# Patient Record
Sex: Female | Born: 1935
Health system: Southern US, Community
[De-identification: ages and names within clinical notes are randomized; demographics above are authoritative.]

## PROBLEM LIST (undated history)

## (undated) DIAGNOSIS — I10 Essential (primary) hypertension: Secondary | ICD-10-CM

## (undated) DIAGNOSIS — K5900 Constipation, unspecified: Secondary | ICD-10-CM

## (undated) DIAGNOSIS — N3281 Overactive bladder: Secondary | ICD-10-CM

## (undated) DIAGNOSIS — D649 Anemia, unspecified: Secondary | ICD-10-CM

## (undated) DIAGNOSIS — S7291XA Unspecified fracture of right femur, initial encounter for closed fracture: Secondary | ICD-10-CM

## (undated) HISTORY — DX: Constipation, unspecified: K59.00

## (undated) HISTORY — PX: ROTATOR CUFF REPAIR: SHX139

## (undated) HISTORY — DX: Unspecified fracture of right femur, initial encounter for closed fracture: S72.91XA

## (undated) HISTORY — DX: Overactive bladder: N32.81

## (undated) HISTORY — PX: KNEE ARTHROPLASTY: SHX992

## (undated) HISTORY — DX: Anemia, unspecified: D64.9

## (undated) HISTORY — PX: ABDOMINAL HYSTERECTOMY: SHX81

---

## 1998-01-03 ENCOUNTER — Ambulatory Visit (HOSPITAL_COMMUNITY): Admission: RE | Admit: 1998-01-03 | Discharge: 1998-01-03 | Payer: Self-pay | Admitting: *Deleted

## 1998-07-17 ENCOUNTER — Encounter: Payer: Self-pay | Admitting: Obstetrics and Gynecology

## 1998-07-17 ENCOUNTER — Ambulatory Visit (HOSPITAL_COMMUNITY): Admission: RE | Admit: 1998-07-17 | Discharge: 1998-07-17 | Payer: Self-pay | Admitting: Obstetrics and Gynecology

## 1999-09-04 ENCOUNTER — Encounter: Payer: Self-pay | Admitting: Obstetrics and Gynecology

## 1999-09-04 ENCOUNTER — Ambulatory Visit (HOSPITAL_COMMUNITY): Admission: RE | Admit: 1999-09-04 | Discharge: 1999-09-04 | Payer: Self-pay | Admitting: Obstetrics and Gynecology

## 2002-10-22 ENCOUNTER — Ambulatory Visit (HOSPITAL_COMMUNITY): Admission: RE | Admit: 2002-10-22 | Discharge: 2002-10-22 | Payer: Self-pay | Admitting: Gastroenterology

## 2007-02-14 ENCOUNTER — Encounter: Admission: RE | Admit: 2007-02-14 | Discharge: 2007-02-28 | Payer: Self-pay | Admitting: Orthopedic Surgery

## 2007-03-06 ENCOUNTER — Inpatient Hospital Stay (HOSPITAL_COMMUNITY): Admission: RE | Admit: 2007-03-06 | Discharge: 2007-03-09 | Payer: Self-pay | Admitting: Orthopedic Surgery

## 2007-03-30 ENCOUNTER — Encounter: Admission: RE | Admit: 2007-03-30 | Discharge: 2007-05-26 | Payer: Self-pay | Admitting: Orthopedic Surgery

## 2011-01-12 NOTE — H&P (Signed)
Amanda Berry, Amanda Berry                ACCOUNT NO.:  [id number]   MEDICAL RECORD NO.:  [id number]          PATIENT TYPE:  INP   LOCATION:  NA                           FACILITY:  [facility]   PHYSICIAN:  Madlyn Frankel. Charlann Boxer, M.D.  DATE OF BIRTH:  1933-11-18   DATE OF ADMISSION:  03/06/2007  DATE OF DISCHARGE:                              HISTORY & PHYSICAL   ATTENDING PHYSICIAN:  Durene Romans, M.D.   PROCEDURE TO BE PERFORMED:  Right total hip arthroplasty.   CHIEF COMPLAINT:  Right knee pain.   HISTORY OF PRESENT ILLNESS:  The patient is a 77 year old female with  persistent progressive right knee pain refractory to conservative  treatment.  She had been presurgically assessed by Dr. Tiburcio Pea for a  right total knee arthroplasty.   PAST MEDICAL HISTORY:  The past medical history includes hypertension,  osteoarthritis, and dyslipidemia.   PAST SURGICAL HISTORY:  Hysterectomy in 1988 and rotator cuff repair in  1996.   FAMILY HISTORY:  Heart disease, cancer, arthritis, and colon cancer.   SOCIAL HISTORY:  She is married, retired, her primary caregiver will be  her husband and daughter after surgery.   DRUG ALLERGIES:  NO KNOWN DRUG ALLERGIES.   MEDICATIONS:  Zocor with unknown dosage amount, one daily, and Tylenol  arthritis p.r.n.Marland Kitchen   REVIEW OF SYSTEMS:  GYNECOLOGIC:  Postmenopausal with hot flashes.  Otherwise see HPI.   PHYSICAL EXAMINATION:  VITAL SIGNS:  Pulse 72, respirations 18, and  blood pressure 136/96.  GENERAL:  Awake, alert and oriented, well-developed, well-nourished, in  no acute distress.  NECK:  Supple no carotid bruits.  CHEST/LUNGS:  Clear to auscultation bilaterally.  BREASTS:  Deferred.  HEART:  Regular rate and rhythm without gallops, clicks, rubs, or  murmurs.  ABDOMEN:  Soft, nontender, nondistended.  Bowel sounds are present in  all four quadrants.  GENITOURINARY:  Deferred.  EXTREMITIES:  Valgus deformity, near full extension.  SKIN:  No cellulitis.   Dorsalis pedis pulse positive, right lower  extremity.  NEUROLOGIC:  Intact distal sensibilities.   LABORATORY STUDIES:  EKG and chest x-ray are all pending.   IMPRESSION:  1. Osteoarthritis.  2. Dyslipidemia.  3. Pre hypertensive.   PLAN OF ACTION:  Right total hip arthroplasty on 03/06/07 at [facility] by surgeon Dr. Durene Romans.  The risks and complications were  discussed, questions were encouraged, answered, and reviewed.   A prescription for postoperative medicines were given at the time of  history and physical.     ______________________________  Yetta Glassman. [address], [place]      Madlyn Frankel. Charlann Boxer, M.D.  Electronically Signed    BLM/MEDQ  D:  03/02/2007  T:  03/03/2007  Job:  [id number]   cc:   Madlyn Frankel Charlann Boxer, M.D.  Fax: [phone]   Yetta Glassman. [address], [place]

## 2011-01-12 NOTE — Op Note (Signed)
Amanda Berry, Amanda Berry                ACCOUNT NO.:  [id number]   MEDICAL RECORD NO.:  [id number]          PATIENT TYPE:  INP   LOCATION:  0002                         FACILITY:  [facility]   PHYSICIAN:  Madlyn Frankel. Charlann Boxer, M.D.  DATE OF BIRTH:  November 23, 1933   DATE OF PROCEDURE:  03/06/2007  DATE OF DISCHARGE:                               OPERATIVE REPORT   PREOPERATIVE DIAGNOSIS:  Right knee end-stage osteoarthritis.   POSTOPERATIVE DIAGNOSIS:  Right knee end-stage osteoarthritis.   PROCEDURE:  Right total knee replacement.   COMPONENTS:  DePuy rotating platform, posterior stabilized knee system  with size 2.5 femur, 2 tibia, 15 mm insert, and a 35 patella button.   SURGEON:  Dr. Charlann Boxer   ASSISTANT:  Dwyane Luo, PA-C   ANESTHESIA:  General/spinal.   COMPLICATIONS:  None.   DRAINS:  None.   TOURNIQUET TIME:  60 minutes at 250 mmHg.   INDICATION FOR PROCEDURE:  Amanda Berry is a very pleasant 77 year old  female who presented to the office with end-stage lateral compartment  degenerative changes with severe valgus deformity of the right knee.  She had a quite significant dynamic deformity of at least 20-25 degrees.  This was not totally passively correctable.  She had 5-degree flexion  contracture giving her a combined deformity of about 25 degrees.   Discussed with her treatment options, as she failed conservative  measures.  Surgical issues were discussed including risk of infection,  DVT, component failure, need for revision and surgery as well as  potential issues in the neurovascular areas given the valgus deformity  that she had.  Consent obtained.   PROCEDURE IN DETAIL:  The patient was brought to the operative theatre.  Once adequate anesthesia and preoperative antibiotics, 1 g of Ancef,  were administered, the patient was positioned supine.  Proximal thigh  tourniquet was placed.  The right lower extremity was then prescrubbed  then prepped and draped in a sterile fashion.  A  midline incision was  made followed by a lateral parapatellar arthrotomy due to her valgus  deformity.  This was done for release purposes and, as well, to assist  with patella tracking.   Following initial exposure and debridement of significant osteophytes  off the distal femur both medial and lateral on the patella, I was able  to subluxate the patella laterally without any complications.   Following initial exposure including fat pad debridement, I was able to  enter the femoral canal with a drill, irrigated and fat emboli.  I then  placed the intramedullary rod at 5 degrees of valgus, and I resected 10  mm of bone.  There was no significant femoral hyperplasia that resulted  in needing distal augments.   Following this, I sized the femur to be a size 2.5.  The anterior,  posterior, and chamfer cuts were all made based off the posterior  condylar axis which actually was, despite her valgus deformity,  perpendicularly to Whiteside's line.   At this point, attention was now directed to the tibia.  Note that  osteophytes were debrided off the medial and lateral aspects  of the  femur.  With the tibia exposed, I used the extramedullary guide.  Initially, I resected 2 mm of bone based off the lateral proximal tibia  that was defective side.  Following this cut, I checked the spacer block  and found that the knee was still tight.  There was still a significant  amount of tightness in the lateral aspect of the leg; however, even with  it out straight, I was unable to get the 10 mm extension block in.  I  replaced the cutting block and resected 2 more mm of bone off the  proximal tibia.   At this point, too, I also carried out more of the lateral release,  elevating the lateral structures off the proximal tibia from my  exposure.  Following this exposure, I then was able to place the 10 mm  block, and the knee actually was fairly balanced.  There was still a  little tightness laterally,  but I felt this was acceptable.  The knee  came out to at least extension given the release and this cut at this  point.  Further debridement was carried out on the posterior aspect of  the femur and around the tibia as needed.  I sized the tibia to be a  size 2.  With the tibial tray in place, I passed an align rod and was  happy that the cut was perpendicular in both the coronal and sagittal  planes.  I went ahead and pinned it, drilled, and keel punched the tibia  and then did a trial reduction, 2.5 femur, 2 tibia, and initially a 10  mm insert.  I then trialed with a 12.  Even with a 12, the knee came out  easily; it was straight, and the knee ligaments very stable.  There was  no laxity medially or laterally, and I gave some thought to retrial with  the final components in place.  At this point, with the trial components  in place, I did my patella cut.  Precut measurement was 22 mm.  That was  taken down to 14 mm and used a 35 patella button.  I did place the 2  holes medial and the 1 hole laterally.   Following this, the patella was noted to track through the trochlea  without any chest tilt.  At this point, all trial components were  removed.  The knee was copiously irrigated with normal saline solution.  It was injected with 60 mL of 0.25% Marcaine with epinephrine and 1 mL  of Toradol.  When the knee component was dried and exposure obtained,  the final components were cemented into position.  The knee was brought  out with a 12.5 spacer initially, and extruded cement was removed.  Once  the cement had cured, the knee was exposed, debriding further cement  that was visible.  I then trialed with a 15 poly and was happy the knee  came out in extension and was balanced.  I chose the 15 poly as my final  insert.   The tourniquet was let down at 60 minutes.  FloSeal was placed in the  posteromedial and lateral aspect of the knee when the final 15 x 2.5  poly was inserted.  At this  point, the extensor mechanism was  reapproximated using #1 Vicryl.  The remainder of the wound was closed  in layers with running 4-0 Monocryl.  The knee was then dressed into a  sterile bulky dressing.  She was brought to the recovery room in stable  condition.      Madlyn Frankel Charlann Boxer, M.D.  Electronically Signed     MDO/MEDQ  D:  03/06/2007  T:  03/06/2007  Job:  [id number]

## 2011-01-12 NOTE — Discharge Summary (Signed)
NAMEKALISHA, Amanda                ACCOUNT NO.:  [id number]   MEDICAL RECORD NO.:  [id number]          PATIENT TYPE:  INP   LOCATION:  1620                         [facility]ACILITY:  [facility]   PHYSICIAN:  Madlyn Frankel. Charlann Boxer, M.D.  DATE OF BIRTH:  08-11-1934   DATE OF ADMISSION:  03/06/2007  DATE OF DISCHARGE:  03/09/2007                               DISCHARGE SUMMARY   ADMITTING DIAGNOSES:  1. Osteoarthritis.  2. Hypertension.   DISCHARGE DIAGNOSES:  1. Osteoarthritis.  2. Hypertension.  3. Acute blood loss anemia.   CONSULTATION:  None.   PROCEDURE:  Right total knee arthroplasty by surgeon Dr. Durene Romans.  Assistant Dwyane Luo PA.   HISTORY OF PRESENT ILLNESS:  Amanda Berry is a 77 year old female with  persistent progressive right knee pain refractory to all conservative  treatment secondary to osteoarthritis.  She was presurgically cleared by  Dr. Tiburcio Pea for right total knee arthroplasty.   LABORATORY DATA:  Labs preadmission CBC hematocrit 38.1.  Postop day #1  hematocrit 27.7.  Postop day #2 hematocrit 22.8, replenished 2 units  packed red blood cells.  At discharge, hematocrit 28.4.  Coagulation  preadmission within normal limits.  Chemistries preadmission all within  normal limits.  Postop day #1, sodium 133, glucose 125.  On postop day  #2, sodium normalized 140, glucose 111 and stable.  Kidney well-perfused  throughout.  GFR greater than 60, calcium 8.1 at discharge.  GI workup  showed albumin 3.4.  UA showed small leukocyte esterase and few  epithelials.   RADIOLOGY:  Chest two-view no acute time.   Cardiology normal sinus rhythm and EKG.   HOSPITAL COURSE:  The patient underwent right total knee arthroplasty.  Tolerated procedure well. Was admitted to the orthopedic floor.  She  remained neurovascularly intact throughout her course of stay.  Pain was  well-controlled.  Dressing was changed after postop day number one on  daily basis.  Wound had no active drainage.  Had a  little bit of nausea  and vomiting and was administered Zofran IV q. 6.  PT was begun as well  as DVT prophylaxis.  Postop day #2, doing well, did have acute blood  loss anemia transfused 2 units tolerated the transfusion well.  Progressed nicely with physical therapy.  On postop day #3, was  afebrile, hematocrit back to 28.4.  Dressing was checked and was ready  for discharge home health care PT after Lovenox teaching.   DISCHARGE DISPOSITION:  Discharged home, stable and improved condition  with home health care PT.   DISCHARGE INSTRUCTIONS:  1. Diet:  Regular.  2. Discharge wound care keep dressing dry change daily.  3. Activity:  Weightbearing as tolerated with the use of a rolling      walker.   DISCHARGE FOLLOWUP:  Follow with Dr. Charlann Boxer in [phone] in 2 weeks.   DISCHARGE MEDICATIONS:  1. Vicodin 5/325 1-2 p.o. q. 4-6 pain.  2. Lovenox 30 mg subcu q. 12 times 11 days.  3. Robaxin 500 mg p.o. q. 6.  4. Colace 100 mg p.o. b.i.d.  5. MiraLax 17 grams p.o.  daily.  6. Enteric-coated aspirin 325 mg p.o. daily x4 weeks after Lovenox.  7. Iron 325 mg p.o. t.i.d. x2 weeks.  8. Zocor 20 mg one q.p.m.  9. Fish oil.  10.Black cohosh   SPECIAL INSTRUCTIONS:  If the patient develops acute shortness of breath  or severe calf pain, follow up with or call emergency services  immediately.     ______________________________  Yetta Glassman [address], [place]      Madlyn Frankel. Charlann Boxer, M.D.  Electronically Signed    BLM/MEDQ  D:  03/21/2007  T:  03/22/2007  Job:  [id number]

## 2011-06-15 LAB — URINALYSIS, ROUTINE W REFLEX MICROSCOPIC
Bilirubin Urine: NEGATIVE
Hgb urine dipstick: NEGATIVE
Specific Gravity, Urine: 1.016
Urobilinogen, UA: 0.2

## 2011-06-15 LAB — CBC
HCT: 38.1
Hemoglobin: 13.1
Hemoglobin: 8.1 — ABNORMAL LOW
MCHC: 34.4
MCHC: 35.3
MCV: 88.9
MCV: 89.4
MCV: 89.8
Platelets: 235
RBC: 2.56 — ABNORMAL LOW
RBC: 3.1 — ABNORMAL LOW
RBC: 4.24
RDW: 14.1 — ABNORMAL HIGH
WBC: 5.3
WBC: 6.9

## 2011-06-15 LAB — BASIC METABOLIC PANEL
CO2: 28
Calcium: 8.3 — ABNORMAL LOW
Chloride: 101
Chloride: 108
Creatinine, Ser: 0.51
GFR calc Af Amer: >60
GFR calc Af Amer: >60
GFR calc non Af Amer: >60
Potassium: 4.2
Sodium: 140

## 2011-06-15 LAB — TYPE AND SCREEN
ABO/RH(D): O NEG
Antibody Screen: NEGATIVE

## 2011-06-15 LAB — COMPREHENSIVE METABOLIC PANEL
ALT: 18
AST: 22
Albumin: 3.4 — ABNORMAL LOW
Alkaline Phosphatase: 71
BUN: 7
CO2: 28
Calcium: 9.3
Chloride: 107
Creatinine, Ser: 0.65
GFR calc Af Amer: >60
GFR calc non Af Amer: >60
Glucose, Bld: 93
Potassium: 4.1
Sodium: 142
Total Bilirubin: 0.9
Total Protein: 6.8

## 2011-06-15 LAB — URINE MICROSCOPIC-ADD ON

## 2011-06-15 LAB — ABO/RH: ABO/RH(D): O NEG

## 2011-06-15 LAB — PROTIME-INR
INR: 1
Prothrombin Time: 13

## 2015-08-31 DIAGNOSIS — S7291XA Unspecified fracture of right femur, initial encounter for closed fracture: Secondary | ICD-10-CM

## 2015-08-31 HISTORY — DX: Unspecified fracture of right femur, initial encounter for closed fracture: S72.91XA

## 2015-09-22 DIAGNOSIS — M159 Polyosteoarthritis, unspecified: Secondary | ICD-10-CM | POA: Diagnosis not present

## 2015-09-22 DIAGNOSIS — I10 Essential (primary) hypertension: Secondary | ICD-10-CM | POA: Diagnosis not present

## 2015-09-22 DIAGNOSIS — K219 Gastro-esophageal reflux disease without esophagitis: Secondary | ICD-10-CM | POA: Diagnosis not present

## 2015-09-22 DIAGNOSIS — Z23 Encounter for immunization: Secondary | ICD-10-CM | POA: Diagnosis not present

## 2015-09-22 DIAGNOSIS — E7800 Pure hypercholesterolemia, unspecified: Secondary | ICD-10-CM | POA: Diagnosis not present

## 2015-09-22 DIAGNOSIS — N3281 Overactive bladder: Secondary | ICD-10-CM | POA: Diagnosis not present

## 2016-03-18 DIAGNOSIS — N3281 Overactive bladder: Secondary | ICD-10-CM | POA: Diagnosis not present

## 2016-03-18 DIAGNOSIS — M159 Polyosteoarthritis, unspecified: Secondary | ICD-10-CM | POA: Diagnosis not present

## 2016-03-18 DIAGNOSIS — E7800 Pure hypercholesterolemia, unspecified: Secondary | ICD-10-CM | POA: Diagnosis not present

## 2016-03-18 DIAGNOSIS — K219 Gastro-esophageal reflux disease without esophagitis: Secondary | ICD-10-CM | POA: Diagnosis not present

## 2016-03-18 DIAGNOSIS — I10 Essential (primary) hypertension: Secondary | ICD-10-CM | POA: Diagnosis not present

## 2016-03-30 DIAGNOSIS — D649 Anemia, unspecified: Secondary | ICD-10-CM

## 2016-03-30 HISTORY — DX: Anemia, unspecified: D64.9

## 2016-04-11 ENCOUNTER — Inpatient Hospital Stay (HOSPITAL_COMMUNITY)
Admission: EM | Admit: 2016-04-11 | Discharge: 2016-04-15 | DRG: 482 | Disposition: A | Discharge status: Skilled Nursing Facility | Payer: PPO | Attending: Orthopedic Surgery | Admitting: Orthopedic Surgery

## 2016-04-11 ENCOUNTER — Inpatient Hospital Stay (HOSPITAL_COMMUNITY): Payer: PPO

## 2016-04-11 ENCOUNTER — Encounter (HOSPITAL_COMMUNITY): Payer: Self-pay | Admitting: Emergency Medicine

## 2016-04-11 ENCOUNTER — Emergency Department (HOSPITAL_COMMUNITY): Payer: PPO

## 2016-04-11 DIAGNOSIS — S72401A Unspecified fracture of lower end of right femur, initial encounter for closed fracture: Secondary | ICD-10-CM | POA: Diagnosis not present

## 2016-04-11 DIAGNOSIS — S72491A Other fracture of lower end of right femur, initial encounter for closed fracture: Secondary | ICD-10-CM

## 2016-04-11 DIAGNOSIS — Z6831 Body mass index (BMI) 31.0-31.9, adult: Secondary | ICD-10-CM | POA: Diagnosis not present

## 2016-04-11 DIAGNOSIS — I10 Essential (primary) hypertension: Secondary | ICD-10-CM | POA: Diagnosis present

## 2016-04-11 DIAGNOSIS — Z01818 Encounter for other preprocedural examination: Secondary | ICD-10-CM | POA: Diagnosis not present

## 2016-04-11 DIAGNOSIS — W010XXA Fall on same level from slipping, tripping and stumbling without subsequent striking against object, initial encounter: Secondary | ICD-10-CM | POA: Diagnosis not present

## 2016-04-11 DIAGNOSIS — S7291XA Unspecified fracture of right femur, initial encounter for closed fracture: Secondary | ICD-10-CM | POA: Diagnosis present

## 2016-04-11 DIAGNOSIS — Z96651 Presence of right artificial knee joint: Secondary | ICD-10-CM | POA: Diagnosis present

## 2016-04-11 DIAGNOSIS — S899 Unspecified injury of lower leg: Secondary | ICD-10-CM | POA: Diagnosis not present

## 2016-04-11 DIAGNOSIS — M9711XA Periprosthetic fracture around internal prosthetic right knee joint, initial encounter: Principal | ICD-10-CM | POA: Diagnosis present

## 2016-04-11 DIAGNOSIS — E669 Obesity, unspecified: Secondary | ICD-10-CM | POA: Diagnosis present

## 2016-04-11 DIAGNOSIS — Z419 Encounter for procedure for purposes other than remedying health state, unspecified: Secondary | ICD-10-CM

## 2016-04-11 DIAGNOSIS — S72411A Displaced unspecified condyle fracture of lower end of right femur, initial encounter for closed fracture: Secondary | ICD-10-CM | POA: Diagnosis not present

## 2016-04-11 DIAGNOSIS — M25561 Pain in right knee: Secondary | ICD-10-CM | POA: Diagnosis not present

## 2016-04-11 DIAGNOSIS — G891 Acute pain, not elsewhere classified: Secondary | ICD-10-CM | POA: Diagnosis not present

## 2016-04-11 HISTORY — DX: Essential (primary) hypertension: I10

## 2016-04-11 LAB — BASIC METABOLIC PANEL
Anion gap: 9 (ref 5–15)
BUN: 18 mg/dL (ref 6–20)
CALCIUM: 9.1 mg/dL (ref 8.9–10.3)
CO2: 20 mmol/L — AB (ref 22–32)
Chloride: 109 mmol/L (ref 101–111)
Creatinine, Ser: 0.69 mg/dL (ref 0.44–1.00)
GFR calc Af Amer: >60 mL/min (ref >60)
GFR calc non Af Amer: >60 mL/min (ref >60)
GLUCOSE: 119 mg/dL — AB (ref 65–99)
POTASSIUM: 4 mmol/L (ref 3.5–5.1)
Sodium: 138 mmol/L (ref 135–145)

## 2016-04-11 LAB — CBC WITH DIFFERENTIAL/PLATELET
Basophils Absolute: 0 10*3/uL (ref 0.0–0.1)
Basophils Relative: 0 %
EOS PCT: 1 %
Eosinophils Absolute: 0.1 10*3/uL (ref 0.0–0.7)
HEMATOCRIT: 35.6 % — AB (ref 36.0–46.0)
Hemoglobin: 11.8 g/dL — ABNORMAL LOW (ref 12.0–15.0)
LYMPHS ABS: 1.5 10*3/uL (ref 0.7–4.0)
LYMPHS PCT: 15 %
MCH: 30.3 pg (ref 26.0–34.0)
MCHC: 33.1 g/dL (ref 30.0–36.0)
MCV: 91.5 fL (ref 78.0–100.0)
MONO ABS: 0.5 10*3/uL (ref 0.1–1.0)
Monocytes Relative: 5 %
NEUTROS ABS: 8.2 10*3/uL — AB (ref 1.7–7.7)
Neutrophils Relative %: 79 %
PLATELETS: 216 10*3/uL (ref 150–400)
RBC: 3.89 MIL/uL (ref 3.87–5.11)
RDW: 14.3 % (ref 11.5–15.5)
WBC: 10.3 10*3/uL (ref 4.0–10.5)

## 2016-04-11 LAB — TYPE AND SCREEN
ABO/RH(D): O NEG
Antibody Screen: NEGATIVE

## 2016-04-11 LAB — SURGICAL PCR SCREEN
MRSA, PCR: NEGATIVE
Staphylococcus aureus: NEGATIVE

## 2016-04-11 LAB — PROTIME-INR
INR: 1.05
PROTHROMBIN TIME: 13.8 s (ref 11.4–15.2)

## 2016-04-11 LAB — APTT: APTT: 26 s (ref 24–36)

## 2016-04-11 MED ORDER — HYDROMORPHONE HCL 1 MG/ML IJ SOLN
0.5000 mg | INTRAMUSCULAR | Status: DC | PRN
  Administered 2016-04-11: 0.5 mg via INTRAVENOUS
  Filled 2016-04-11: qty 1

## 2016-04-11 MED ORDER — FENTANYL CITRATE (PF) 100 MCG/2ML IJ SOLN
100.0000 ug | Freq: Once | INTRAMUSCULAR | Status: AC
  Administered 2016-04-11: 100 ug via INTRAVENOUS
  Filled 2016-04-11: qty 2

## 2016-04-11 MED ORDER — ONDANSETRON HCL 4 MG/2ML IJ SOLN
4.0000 mg | Freq: Once | INTRAMUSCULAR | Status: AC
  Administered 2016-04-11: 4 mg via INTRAVENOUS
  Filled 2016-04-11: qty 2

## 2016-04-11 MED ORDER — CEFAZOLIN SODIUM-DEXTROSE 2-4 GM/100ML-% IV SOLN
2.0000 g | INTRAVENOUS | Status: AC
Start: 2016-04-12 — End: 2016-04-12
  Administered 2016-04-12: 2 g via INTRAVENOUS

## 2016-04-11 MED ORDER — HYDROCODONE-ACETAMINOPHEN 5-325 MG PO TABS
1.0000 | ORAL_TABLET | Freq: Four times a day (QID) | ORAL | Status: DC | PRN
  Administered 2016-04-11 – 2016-04-12 (×3): 2 via ORAL
  Filled 2016-04-11 (×3): qty 2

## 2016-04-11 MED ORDER — FAMOTIDINE 20 MG PO TABS
20.0000 mg | ORAL_TABLET | Freq: Every day | ORAL | Status: DC
  Administered 2016-04-12: 20 mg via ORAL
  Filled 2016-04-11: qty 1

## 2016-04-11 MED ORDER — OXYBUTYNIN CHLORIDE 5 MG PO TABS
5.0000 mg | ORAL_TABLET | Freq: Every day | ORAL | Status: DC
  Filled 2016-04-11: qty 1

## 2016-04-11 MED ORDER — SODIUM CHLORIDE 0.9 % IV SOLN
INTRAVENOUS | Status: DC
  Administered 2016-04-11 – 2016-04-12 (×2): via INTRAVENOUS

## 2016-04-11 MED ORDER — MORPHINE SULFATE (PF) 2 MG/ML IV SOLN: 0.5000 mg | INTRAVENOUS | Status: DC | PRN

## 2016-04-11 MED ORDER — CHLORHEXIDINE GLUCONATE 4 % EX LIQD: 60.0000 mL | Freq: Once | CUTANEOUS | Status: DC

## 2016-04-11 MED ORDER — DOCUSATE SODIUM 100 MG PO CAPS
100.0000 mg | ORAL_CAPSULE | Freq: Two times a day (BID) | ORAL | Status: DC
  Administered 2016-04-11: 100 mg via ORAL
  Filled 2016-04-11: qty 1

## 2016-04-11 MED ORDER — POLYETHYLENE GLYCOL 3350 17 G PO PACK: 17.0000 g | PACK | Freq: Every day | ORAL | Status: DC | PRN

## 2016-04-11 MED ORDER — LISINOPRIL 20 MG PO TABS
20.0000 mg | ORAL_TABLET | Freq: Every day | ORAL | Status: DC
  Administered 2016-04-11: 20 mg via ORAL
  Filled 2016-04-11: qty 1

## 2016-04-11 NOTE — ED Notes (Signed)
Bed: WA20 Expected date:  Expected time:  Means of arrival:  Comments: 82 yo fall, Knee pain

## 2016-04-11 NOTE — ED Notes (Signed)
Ortho at bedside.

## 2016-04-11 NOTE — ED Provider Notes (Signed)
[place] DEPT Provider Note   CSN: [id number] Arrival date & time: 04/11/16  1127  First Provider Contact:  First MD Initiated Contact with Patient 04/11/16 1210        History   Chief Complaint Chief Complaint  Patient presents with  . Knee Pain    HPI Amanda Berry is a 82 y.o. female who presents via EMS for knee injury. The patient tripped over her shoe , falling directly onto her right knee. She is a history of previous total knee arthroplasty by Dr. Alvan Dame. She states she had immediate severe pain, swelling and bruising. She is unable even to sit up. EMS had to retrieve her from middle of her church service. She complains of severe pain in the knee and occasional sharp pain radiating up the leg. She denies any numbness or tingling in the foot. She denies hitting her head or lose consciousness. She has a past medical of hypertension. She takes red Rice. He states for cholesterol control, she denies being on any blood thinners and has no cardiac history.  HPI  Past Medical History:  Diagnosis Date  . Hypertension     Patient Active Problem List   Diagnosis Date Noted  . Femur fracture, right, closed, initial encounter 04/11/2016    Past Surgical History:  Procedure Laterality Date  . KNEE ARTHROPLASTY Right    approx 10 years ago    OB History    No data available       Home Medications    Prior to Admission medications   Medication Sig Start Date End Date Taking? Authorizing Provider  ibuprofen (ADVIL,MOTRIN) 200 MG tablet Take 200 mg by mouth every 6 (six) hours as needed for headache, mild pain or moderate pain.   Yes Historical Provider, MD  lisinopril (PRINIVIL,ZESTRIL) 20 MG tablet Take 20 mg by mouth daily. 01/15/16  Yes Historical Provider, MD  oxybutynin (DITROPAN) 5 MG tablet Take 5 mg by mouth at bedtime.  03/23/16  Yes Historical Provider, MD  ranitidine (ZANTAC) 300 MG tablet Take 300 mg by mouth at bedtime.  02/17/16  Yes Historical Provider, MD    Red Yeast Rice Extract (RED YEAST RICE PO) Take 1 tablet by mouth daily.   Yes Historical Provider, MD    Family History No family history on file.  Social History Social History  Substance Use Topics  . Smoking status: Never Smoker  . Smokeless tobacco: Never Used  . Alcohol use Not on file     Allergies   Review of patient's allergies indicates no known allergies.   Review of Systems Review of Systems Ten systems reviewed and are negative for acute change, except as noted in the HPI.    Physical Exam Updated Vital Signs BP (!) 176/65 (BP Location: Right Arm)   Pulse 62   Temp 98.3 F (36.8 C) (Oral)   Resp 18   Ht 5' (1.524 m)   Wt 72.1 kg   SpO2 99%   BMI 31.05 kg/m   Physical Exam  Physical Exam  Nursing note and vitals reviewed. Constitutional: She is oriented to person, place, and time. She appears well-developed and well-nourished. No distress. Appears uncomfortable HENT:  Head: Normocephalic and atraumatic.  Eyes: Conjunctivae normal and EOM are normal. Pupils are equal, round, and reactive to light. No scleral icterus.  Neck: Normal range of motion.  Cardiovascular: Normal rate, regular rhythm and normal heart sounds.  Exam reveals no gallop and no friction rub.   No murmur heard.  Pulmonary/Chest: Effort normal and breath sounds normal. No respiratory distress.  Abdominal: Soft. Bowel sounds are normal. She exhibits no distension and no mass. There is no tenderness. There is no guarding.  Neurological: She is alert and oriented to person, place, and time.  Musculoskeletal: Significant bruising, swelling to the right knee, exquisite tenderness to palpation at the distal femur, unable to perform range of motion testing the knee due to severe pain. Distal pulses intact, full warm and well perfused, sensation intact. No tenderness to palpation of the right hip Skin: Skin is warm and dry. She is not diaphoretic.    ED Treatments / Results  Labs (all labs  ordered are listed, but only abnormal results are displayed) Labs Reviewed  CBC WITH DIFFERENTIAL/PLATELET - Abnormal; Notable for the following:       Result Value   Hemoglobin 11.8 (*)    HCT 35.6 (*)    Neutro Abs 8.2 (*)    All other components within normal limits  BASIC METABOLIC PANEL - Abnormal; Notable for the following:    CO2 20 (*)    Glucose, Bld 119 (*)    All other components within normal limits  APTT  PROTIME-INR  TYPE AND SCREEN    EKG  EKG Interpretation None       Radiology Chest Portable 1 View  Result Date: 04/11/2016 CLINICAL DATA:  Preoperative evaluation EXAM: PORTABLE CHEST 1 VIEW COMPARISON:  09/05/2012 FINDINGS: Cardiac shadow is within normal limits. Aortic calcifications are again seen. Lungs are well-aerated without focal infiltrate or sizable effusion. No acute bony abnormality is noted. IMPRESSION: No active disease. Electronically Signed   By: Inez Catalina M.D.   On: 04/11/2016 16:20   Dg Knee Complete 4 Views Right  Result Date: 04/11/2016 CLINICAL DATA:  Fall today. Right knee pain. Previous right knee arthroplasty. Initial encounter. EXAM: RIGHT KNEE - COMPLETE 4+ VIEW COMPARISON:  None. FINDINGS: Total knee arthroplasty seen. A comminuted fracture of the distal femur is seen just above the femoral component of the prosthesis. There is mild posterior displacement and angulation of the distal fracture fragment. No other fractures identified. No evidence of dislocation. Generalized osteopenia noted. IMPRESSION: Comminuted fracture of distal femur just above femoral component of prosthesis, with mild posterior displacement and angulation. Electronically Signed   By: Earle Gell M.D.   On: 04/11/2016 12:40    Procedures Procedures (including critical care time)  Medications Ordered in ED Medications  HYDROmorphone (DILAUDID) injection 0.5 mg (0.5 mg Intravenous Given 04/11/16 1442)  lisinopril (PRINIVIL,ZESTRIL) tablet 20 mg (not administered)    oxybutynin (DITROPAN) tablet 5 mg (not administered)  famotidine (PEPCID) tablet 20 mg (not administered)  chlorhexidine (HIBICLENS) 4 % liquid 4 application (not administered)  ceFAZolin (ANCEF) IVPB 2g/100 mL premix (not administered)  HYDROcodone-acetaminophen (NORCO/VICODIN) 5-325 MG per tablet 1-2 tablet (not administered)  morphine 2 MG/ML injection 0.5 mg (not administered)  0.9 %  sodium chloride infusion ( Intravenous New Bag/Given 04/11/16 1711)  docusate sodium (COLACE) capsule 100 mg (not administered)  polyethylene glycol (MIRALAX / GLYCOLAX) packet 17 g (not administered)  fentaNYL (SUBLIMAZE) injection 100 mcg (100 mcg Intravenous Given 04/11/16 1333)  ondansetron (ZOFRAN) injection 4 mg (4 mg Intravenous Given 04/11/16 1330)     Initial Impression / Assessment and Plan / ED Course  I have reviewed the triage vital signs and the nursing notes.  Pertinent labs & imaging results that were available during my care of the patient were reviewed by me and considered in  my medical decision making (see chart for details).  Clinical Course  Patient with a distal femur fracture above the prosthesis. I have spoken with Dr. Joaquin Courts is to will consult on the patient here. She has been nothing by mouth since 745 this morning when she had a slice of cheese toast. Discussed the findings with the patient who understands. She appears safe for admission at this time.   Final Clinical Impressions(s) / ED Diagnoses   Final diagnoses:  Other closed fracture of distal end of right femur, initial encounter [facility])    New Prescriptions Current Discharge Medication List       Margarita Mail, PA-C 04/11/16 [place], MD 04/12/16 1701

## 2016-04-11 NOTE — ED Triage Notes (Signed)
Per EMS, pt c/o right knee pain after falling in church. Denies head injury and LOC. Hx right knee replacement.

## 2016-04-11 NOTE — ED Notes (Signed)
Patients family has clothing and rings in patient belonging bag.

## 2016-04-11 NOTE — H&P (Signed)
Amanda Berry is an 82 y.o. female.   Chief Complaint: Right knee pain HPI: 82 yo patient of Dr Adriana Mccallum who is s/p TKR who fell at church and injured her right knee.  She was unable to stand after injury.  Transported by EMS to [facility] ED.  Past Medical History:  Diagnosis Date  . Hypertension     No past surgical history on file.  No family history on file. Social History:  has no tobacco, alcohol, and drug history on file.  Allergies: No Known Allergies   (Not in a hospital admission)  Results for orders placed or performed during the hospital encounter of 04/11/16 (from the past 48 hour(s))  CBC with Differential/Platelet     Status: Abnormal   Collection Time: 04/11/16  1:02 PM  Result Value Ref Range   WBC 10.3 4.0 - 10.5 K/uL   RBC 3.89 3.87 - 5.11 MIL/uL   Hemoglobin 11.8 (L) 12.0 - 15.0 g/dL   HCT 35.6 (L) 36.0 - 46.0 %   MCV 91.5 78.0 - 100.0 fL   MCH 30.3 26.0 - 34.0 pg   MCHC 33.1 30.0 - 36.0 g/dL   RDW 14.3 11.5 - 15.5 %   Platelets 216 150 - 400 K/uL   Neutrophils Relative % 79 %   Neutro Abs 8.2 (H) 1.7 - 7.7 K/uL   Lymphocytes Relative 15 %   Lymphs Abs 1.5 0.7 - 4.0 K/uL   Monocytes Relative 5 %   Monocytes Absolute 0.5 0.1 - 1.0 K/uL   Eosinophils Relative 1 %   Eosinophils Absolute 0.1 0.0 - 0.7 K/uL   Basophils Relative 0 %   Basophils Absolute 0.0 0.0 - 0.1 K/uL  Basic metabolic panel     Status: Abnormal   Collection Time: 04/11/16  1:02 PM  Result Value Ref Range   Sodium 138 135 - 145 mmol/L   Potassium 4.0 3.5 - 5.1 mmol/L   Chloride 109 101 - 111 mmol/L   CO2 20 (L) 22 - 32 mmol/L   Glucose, Bld 119 (H) 65 - 99 mg/dL   BUN 18 6 - 20 mg/dL   Creatinine, Ser 0.69 0.44 - 1.00 mg/dL   Calcium 9.1 8.9 - 10.3 mg/dL   GFR calc non Af Amer >60 >60 mL/min   GFR calc Af Amer >60 >60 mL/min    Comment: (NOTE) The eGFR has been calculated using the CKD EPI equation. This calculation has not been validated in all clinical situations. eGFR's  persistently <60 mL/min signify possible Chronic Kidney Disease.    Anion gap 9 5 - 15   Dg Knee Complete 4 Views Right  Result Date: 04/11/2016 CLINICAL DATA:  Fall today. Right knee pain. Previous right knee arthroplasty. Initial encounter. EXAM: RIGHT KNEE - COMPLETE 4+ VIEW COMPARISON:  None. FINDINGS: Total knee arthroplasty seen. A comminuted fracture of the distal femur is seen just above the femoral component of the prosthesis. There is mild posterior displacement and angulation of the distal fracture fragment. No other fractures identified. No evidence of dislocation. Generalized osteopenia noted. IMPRESSION: Comminuted fracture of distal femur just above femoral component of prosthesis, with mild posterior displacement and angulation. Electronically Signed   By: Earle Gell M.D.   On: 04/11/2016 12:40    ROS  Blood pressure 134/63, pulse 65, temperature 98.4 F (36.9 C), temperature source Oral, resp. rate 18, height 5' (1.524 m), weight 72.1 kg (159 lb), SpO2 96 %. Physical Exam AAO, mod distress.  Neck  nontender and normal AROM Chest with normal excursion, no pain with palpation heart regular Bilateral UEs with normal AROM and 5/5 strength.  Right knee swollen and bruised and unable to move due to pain. NVI distally R LE. Skin intact. Left LE with normal AROM and NVI   Assessment/Plan Right distal femur periprosthetic fracture. Discussed with Dr Alvan Dame who plans ORIF vs distal femur replacement tomorrow afternoon or evening. Admit for pain control and surgical planning. Labs ordered Mechanical DVT prophylaxis Knee immobilizer for now  Augustin Schooling, MD 04/11/2016, 2:22 PM

## 2016-04-12 ENCOUNTER — Inpatient Hospital Stay (HOSPITAL_COMMUNITY): Payer: PPO | Admitting: Anesthesiology

## 2016-04-12 ENCOUNTER — Encounter (HOSPITAL_COMMUNITY): Payer: Self-pay

## 2016-04-12 ENCOUNTER — Inpatient Hospital Stay (HOSPITAL_COMMUNITY): Payer: PPO

## 2016-04-12 ENCOUNTER — Encounter (HOSPITAL_COMMUNITY)
Admission: EM | Disposition: A | Discharge status: Skilled Nursing Facility | Payer: Self-pay | Source: Home / Self Care | Attending: Orthopedic Surgery

## 2016-04-12 DIAGNOSIS — S72411A Displaced unspecified condyle fracture of lower end of right femur, initial encounter for closed fracture: Secondary | ICD-10-CM | POA: Diagnosis not present

## 2016-04-12 DIAGNOSIS — S72491A Other fracture of lower end of right femur, initial encounter for closed fracture: Secondary | ICD-10-CM | POA: Diagnosis not present

## 2016-04-12 DIAGNOSIS — S72401A Unspecified fracture of lower end of right femur, initial encounter for closed fracture: Secondary | ICD-10-CM | POA: Diagnosis not present

## 2016-04-12 DIAGNOSIS — E669 Obesity, unspecified: Secondary | ICD-10-CM | POA: Diagnosis not present

## 2016-04-12 DIAGNOSIS — I10 Essential (primary) hypertension: Secondary | ICD-10-CM | POA: Diagnosis not present

## 2016-04-12 DIAGNOSIS — W010XXA Fall on same level from slipping, tripping and stumbling without subsequent striking against object, initial encounter: Secondary | ICD-10-CM | POA: Diagnosis not present

## 2016-04-12 DIAGNOSIS — Z96651 Presence of right artificial knee joint: Secondary | ICD-10-CM | POA: Diagnosis not present

## 2016-04-12 DIAGNOSIS — M9711XA Periprosthetic fracture around internal prosthetic right knee joint, initial encounter: Secondary | ICD-10-CM | POA: Diagnosis not present

## 2016-04-12 DIAGNOSIS — Z6831 Body mass index (BMI) 31.0-31.9, adult: Secondary | ICD-10-CM | POA: Diagnosis not present

## 2016-04-12 HISTORY — PX: ORIF PERIPROSTHETIC FRACTURE: SHX5034

## 2016-04-12 SURGERY — OPEN REDUCTION INTERNAL FIXATION (ORIF) PERIPROSTHETIC FRACTURE
Anesthesia: General | Laterality: Right

## 2016-04-12 MED ORDER — ONDANSETRON HCL 4 MG/2ML IJ SOLN
INTRAMUSCULAR | Status: DC | PRN
  Administered 2016-04-12: 4 mg via INTRAVENOUS

## 2016-04-12 MED ORDER — LIDOCAINE HCL (CARDIAC) 20 MG/ML IV SOLN
INTRAVENOUS | Status: AC
  Filled 2016-04-12: qty 5

## 2016-04-12 MED ORDER — METHOCARBAMOL 1000 MG/10ML IJ SOLN
500.0000 mg | Freq: Four times a day (QID) | INTRAMUSCULAR | Status: DC | PRN
  Administered 2016-04-12 (×2): 500 mg via INTRAVENOUS
  Filled 2016-04-12: qty 550
  Filled 2016-04-12: qty 5
  Filled 2016-04-12: qty 550
  Filled 2016-04-12: qty 5

## 2016-04-12 MED ORDER — ONDANSETRON HCL 4 MG/2ML IJ SOLN
INTRAMUSCULAR | Status: AC
  Filled 2016-04-12: qty 2

## 2016-04-12 MED ORDER — PHENYLEPHRINE 40 MCG/ML (10ML) SYRINGE FOR IV PUSH (FOR BLOOD PRESSURE SUPPORT)
PREFILLED_SYRINGE | INTRAVENOUS | Status: AC
  Filled 2016-04-12: qty 10

## 2016-04-12 MED ORDER — BISACODYL 10 MG RE SUPP: 10.0000 mg | Freq: Every day | RECTAL | Status: DC | PRN

## 2016-04-12 MED ORDER — SUGAMMADEX SODIUM 200 MG/2ML IV SOLN
INTRAVENOUS | Status: AC
  Filled 2016-04-12: qty 2

## 2016-04-12 MED ORDER — DEXAMETHASONE SODIUM PHOSPHATE 10 MG/ML IJ SOLN
INTRAMUSCULAR | Status: DC | PRN
  Administered 2016-04-12: 10 mg via INTRAVENOUS

## 2016-04-12 MED ORDER — SODIUM CHLORIDE 0.9 % IV SOLN
INTRAVENOUS | Status: DC | PRN
  Administered 2016-04-12 (×2): 80 ug via INTRAVENOUS

## 2016-04-12 MED ORDER — LIDOCAINE HCL (CARDIAC) 20 MG/ML IV SOLN
INTRAVENOUS | Status: DC | PRN
  Administered 2016-04-12: 100 mg via INTRAVENOUS

## 2016-04-12 MED ORDER — HYDROCODONE-ACETAMINOPHEN 5-325 MG PO TABS
1.0000 | ORAL_TABLET | ORAL | Status: DC | PRN
  Administered 2016-04-13 – 2016-04-15 (×7): 2 via ORAL
  Filled 2016-04-12 (×7): qty 2

## 2016-04-12 MED ORDER — ONDANSETRON HCL 4 MG PO TABS: 4.0000 mg | ORAL_TABLET | Freq: Four times a day (QID) | ORAL | Status: DC | PRN

## 2016-04-12 MED ORDER — FENTANYL CITRATE (PF) 100 MCG/2ML IJ SOLN
INTRAMUSCULAR | Status: AC
  Filled 2016-04-12: qty 2

## 2016-04-12 MED ORDER — SODIUM CHLORIDE 0.9 % IV SOLN
INTRAVENOUS | Status: DC
  Administered 2016-04-12: 22:00:00 via INTRAVENOUS
  Filled 2016-04-12 (×6): qty 1000

## 2016-04-12 MED ORDER — PROPOFOL 10 MG/ML IV BOLUS
INTRAVENOUS | Status: DC | PRN
  Administered 2016-04-12: 130 mg via INTRAVENOUS

## 2016-04-12 MED ORDER — METHOCARBAMOL 500 MG PO TABS: 500.0000 mg | ORAL_TABLET | Freq: Four times a day (QID) | ORAL | Status: DC | PRN

## 2016-04-12 MED ORDER — ROCURONIUM BROMIDE 100 MG/10ML IV SOLN
INTRAVENOUS | Status: AC
  Filled 2016-04-12: qty 1

## 2016-04-12 MED ORDER — CEFAZOLIN SODIUM-DEXTROSE 2-4 GM/100ML-% IV SOLN
2.0000 g | Freq: Four times a day (QID) | INTRAVENOUS | Status: AC
  Administered 2016-04-12 – 2016-04-13 (×2): 2 g via INTRAVENOUS
  Filled 2016-04-12: qty 100

## 2016-04-12 MED ORDER — DEXAMETHASONE SODIUM PHOSPHATE 10 MG/ML IJ SOLN
INTRAMUSCULAR | Status: AC
  Filled 2016-04-12: qty 1

## 2016-04-12 MED ORDER — MAGNESIUM CITRATE PO SOLN
1.0000 | Freq: Once | ORAL | Status: DC | PRN
Start: 2016-04-12 — End: 2016-04-15

## 2016-04-12 MED ORDER — FENTANYL CITRATE (PF) 100 MCG/2ML IJ SOLN
INTRAMUSCULAR | Status: DC | PRN
  Administered 2016-04-12 (×3): 25 ug via INTRAVENOUS
  Administered 2016-04-12: 50 ug via INTRAVENOUS
  Administered 2016-04-12 (×3): 25 ug via INTRAVENOUS

## 2016-04-12 MED ORDER — PROPOFOL 10 MG/ML IV BOLUS
INTRAVENOUS | Status: AC
  Filled 2016-04-12: qty 20

## 2016-04-12 MED ORDER — SUGAMMADEX SODIUM 200 MG/2ML IV SOLN
INTRAVENOUS | Status: DC | PRN
  Administered 2016-04-12: 200 mg via INTRAVENOUS

## 2016-04-12 MED ORDER — MENTHOL 3 MG MT LOZG: 1.0000 | LOZENGE | OROMUCOSAL | Status: DC | PRN

## 2016-04-12 MED ORDER — HYDROCODONE-ACETAMINOPHEN 5-325 MG PO TABS
1.0000 | ORAL_TABLET | Freq: Four times a day (QID) | ORAL | Status: DC | PRN
  Administered 2016-04-12: 2 via ORAL
  Filled 2016-04-12: qty 2

## 2016-04-12 MED ORDER — CEFAZOLIN SODIUM-DEXTROSE 2-4 GM/100ML-% IV SOLN
INTRAVENOUS | Status: AC
  Filled 2016-04-12: qty 100

## 2016-04-12 MED ORDER — FERROUS SULFATE 325 (65 FE) MG PO TABS
325.0000 mg | ORAL_TABLET | Freq: Three times a day (TID) | ORAL | Status: DC
  Administered 2016-04-13 – 2016-04-15 (×6): 325 mg via ORAL
  Filled 2016-04-12 (×6): qty 1

## 2016-04-12 MED ORDER — ALUM & MAG HYDROXIDE-SIMETH 200-200-20 MG/5ML PO SUSP
30.0000 mL | ORAL | Status: DC | PRN
  Administered 2016-04-13: 30 mL via ORAL
  Filled 2016-04-12: qty 30

## 2016-04-12 MED ORDER — LACTATED RINGERS IV SOLN
INTRAVENOUS | Status: DC
  Administered 2016-04-12: 1000 mL via INTRAVENOUS
  Administered 2016-04-12: 16:00:00 via INTRAVENOUS

## 2016-04-12 MED ORDER — PHENOL 1.4 % MT LIQD: 1.0000 | OROMUCOSAL | Status: DC | PRN

## 2016-04-12 MED ORDER — SUCCINYLCHOLINE CHLORIDE 20 MG/ML IJ SOLN
INTRAMUSCULAR | Status: DC | PRN
  Administered 2016-04-12: 100 mg via INTRAVENOUS

## 2016-04-12 MED ORDER — METOCLOPRAMIDE HCL 5 MG PO TABS: 5.0000 mg | ORAL_TABLET | Freq: Three times a day (TID) | ORAL | Status: DC | PRN

## 2016-04-12 MED ORDER — ONDANSETRON HCL 4 MG/2ML IJ SOLN: 4.0000 mg | Freq: Four times a day (QID) | INTRAMUSCULAR | Status: DC | PRN

## 2016-04-12 MED ORDER — DOCUSATE SODIUM 100 MG PO CAPS
100.0000 mg | ORAL_CAPSULE | Freq: Two times a day (BID) | ORAL | Status: DC
  Administered 2016-04-12 – 2016-04-15 (×6): 100 mg via ORAL
  Filled 2016-04-12 (×6): qty 1

## 2016-04-12 MED ORDER — ASPIRIN EC 325 MG PO TBEC
325.0000 mg | DELAYED_RELEASE_TABLET | Freq: Two times a day (BID) | ORAL | Status: DC
  Administered 2016-04-13 – 2016-04-15 (×5): 325 mg via ORAL
  Filled 2016-04-12 (×5): qty 1

## 2016-04-12 MED ORDER — FENTANYL CITRATE (PF) 100 MCG/2ML IJ SOLN
25.0000 ug | INTRAMUSCULAR | Status: DC | PRN
  Administered 2016-04-12 (×3): 50 ug via INTRAVENOUS

## 2016-04-12 MED ORDER — POLYETHYLENE GLYCOL 3350 17 G PO PACK
17.0000 g | PACK | Freq: Two times a day (BID) | ORAL | Status: DC
  Administered 2016-04-13 – 2016-04-15 (×4): 17 g via ORAL
  Filled 2016-04-12 (×4): qty 1

## 2016-04-12 MED ORDER — ROCURONIUM BROMIDE 100 MG/10ML IV SOLN
INTRAVENOUS | Status: DC | PRN
  Administered 2016-04-12: 30 mg via INTRAVENOUS

## 2016-04-12 MED ORDER — METOCLOPRAMIDE HCL 5 MG/ML IJ SOLN: 5.0000 mg | Freq: Three times a day (TID) | INTRAMUSCULAR | Status: DC | PRN

## 2016-04-12 SURGICAL SUPPLY — 65 items
BAG ZIPLOCK 12X15 (MISCELLANEOUS) IMPLANT
BIT DRILL CALIBRATED 4.3MMX365 (DRILL) ×1 IMPLANT
BIT DRILL CROWE PNT TWST 4.5MM (DRILL) ×1 IMPLANT
CLOSURE WOUND 1/2 X4 (GAUZE/BANDAGES/DRESSINGS)
DRAPE INCISE IOBAN 66X45 STRL (DRAPES) IMPLANT
DRAPE ORTHO SPLIT 77X108 STRL (DRAPES) ×4
DRAPE POUCH INSTRU U-SHP 10X18 (DRAPES) ×3 IMPLANT
DRAPE SURG 17X11 SM STRL (DRAPES) ×3 IMPLANT
DRAPE SURG ORHT 6 SPLT 77X108 (DRAPES) ×2 IMPLANT
DRAPE U-SHAPE 47X51 STRL (DRAPES) ×3 IMPLANT
DRILL CALIBRATED 4.3MMX365 (DRILL) ×3
DRILL CROWE POINT TWIST 4.5MM (DRILL) ×3
DRSG AQUACEL AG ADV 3.5X 4 (GAUZE/BANDAGES/DRESSINGS) ×3 IMPLANT
DRSG AQUACEL AG ADV 3.5X10 (GAUZE/BANDAGES/DRESSINGS) ×3 IMPLANT
DRSG EMULSION OIL 3X16 NADH (GAUZE/BANDAGES/DRESSINGS) IMPLANT
DRSG PAD ABDOMINAL 8X10 ST (GAUZE/BANDAGES/DRESSINGS) IMPLANT
DURAPREP 26ML APPLICATOR (WOUND CARE) ×6 IMPLANT
ELECT BLADE TIP CTD 4 INCH (ELECTRODE) IMPLANT
ELECT REM PT RETURN 9FT ADLT (ELECTROSURGICAL) ×3
ELECTRODE REM PT RTRN 9FT ADLT (ELECTROSURGICAL) ×1 IMPLANT
EVACUATOR 1/8 PVC DRAIN (DRAIN) ×3 IMPLANT
FACESHIELD WRAPAROUND (MASK) ×12 IMPLANT
GAUZE SPONGE 4X4 12PLY STRL (GAUZE/BANDAGES/DRESSINGS) IMPLANT
GLOVE BIOGEL M 7.0 STRL (GLOVE) IMPLANT
GLOVE BIOGEL PI IND STRL 7.5 (GLOVE) ×5 IMPLANT
GLOVE BIOGEL PI IND STRL 8.5 (GLOVE) ×1 IMPLANT
GLOVE BIOGEL PI INDICATOR 7.5 (GLOVE) ×10
GLOVE BIOGEL PI INDICATOR 8.5 (GLOVE) ×2
GLOVE ECLIPSE 8.0 STRL XLNG CF (GLOVE) IMPLANT
GLOVE ORTHO TXT STRL SZ7.5 (GLOVE) ×3 IMPLANT
GLOVE SURG ORTHO 8.0 STRL STRW (GLOVE) ×3 IMPLANT
GOWN STRL REUS W/TWL LRG LVL3 (GOWN DISPOSABLE) ×9 IMPLANT
GOWN STRL REUS W/TWL XL LVL3 (GOWN DISPOSABLE) ×6 IMPLANT
GUIDEWIRE BEAD TIP (WIRE) ×3 IMPLANT
IMMOBILIZER KNEE 20 (SOFTGOODS)
IMMOBILIZER KNEE 20 THIGH 36 (SOFTGOODS) IMPLANT
KIT BASIN OR (CUSTOM PROCEDURE TRAY) ×3 IMPLANT
LIQUID BAND (GAUZE/BANDAGES/DRESSINGS) ×3 IMPLANT
MANIFOLD NEPTUNE II (INSTRUMENTS) ×3 IMPLANT
NAIL FEM RETRO 10.5X250 (Nail) ×3 IMPLANT
NS IRRIG 1000ML POUR BTL (IV SOLUTION) ×6 IMPLANT
PACK TOTAL JOINT (CUSTOM PROCEDURE TRAY) ×3 IMPLANT
PASSER SUT SWANSON 36MM LOOP (INSTRUMENTS) IMPLANT
POSITIONER SURGICAL ARM (MISCELLANEOUS) ×3 IMPLANT
SCREW CORT TI DBL LEAD 5X30 (Screw) ×3 IMPLANT
SCREW CORT TI DBL LEAD 5X50 (Screw) ×6 IMPLANT
SCREW CORT TI DBL LEAD 5X60 (Screw) ×3 IMPLANT
SCREW CORT TI DBL LEAD 5X70 (Screw) ×3 IMPLANT
SCREW CORT TI DBL LEAD 5X75 (Screw) ×3 IMPLANT
SPONGE LAP 18X18 X RAY DECT (DISPOSABLE) IMPLANT
SPONGE LAP 4X18 X RAY DECT (DISPOSABLE) IMPLANT
STAPLER SKIN PROX WIDE 3.9 (STAPLE) IMPLANT
STAPLER VISISTAT 35W (STAPLE) ×3 IMPLANT
STRIP CLOSURE SKIN 1/2X4 (GAUZE/BANDAGES/DRESSINGS) IMPLANT
SUCTION FRAZIER HANDLE 10FR (MISCELLANEOUS) ×2
SUCTION TUBE FRAZIER 10FR DISP (MISCELLANEOUS) ×1 IMPLANT
SUT ETHIBOND NAB CT1 #1 30IN (SUTURE) IMPLANT
SUT MNCRL AB 4-0 PS2 18 (SUTURE) IMPLANT
SUT VIC AB 1 CT1 36 (SUTURE) ×9 IMPLANT
SUT VIC AB 2-0 CT1 27 (SUTURE) ×2
SUT VIC AB 2-0 CT1 TAPERPNT 27 (SUTURE) ×1 IMPLANT
TOWEL OR 17X26 10 PK STRL BLUE (TOWEL DISPOSABLE) ×3 IMPLANT
TRAY FOLEY W/METER SILVER 14FR (SET/KITS/TRAYS/PACK) IMPLANT
TRAY FOLEY W/METER SILVER 16FR (SET/KITS/TRAYS/PACK) IMPLANT
WATER STERILE IRR 1500ML POUR (IV SOLUTION) IMPLANT

## 2016-04-12 NOTE — Anesthesia Procedure Notes (Signed)
Procedure Name: Intubation Date/Time: 04/12/2016 2:43 PM Performed by: Lind Covert Pre-anesthesia Checklist: Timeout performed, Patient identified, Emergency Drugs available, Suction available and Patient being monitored Patient Re-evaluated:Patient Re-evaluated prior to inductionOxygen Delivery Method: Circle system utilized Preoxygenation: Pre-oxygenation with 100% oxygen Intubation Type: IV induction Laryngoscope Size: Mac and 3 Tube type: Oral Tube size: 7.0 mm Number of attempts: 1 Airway Equipment and Method: Stylet Placement Confirmation: ETT inserted through vocal cords under direct vision,  positive ETCO2 and breath sounds checked- equal and bilateral Secured at: 21 cm Tube secured with: Tape Dental Injury: Teeth and Oropharynx as per pre-operative assessment

## 2016-04-12 NOTE — Progress Notes (Signed)
Patient ID: Amanda Berry, female   DOB: 01-21-1934, 82 y.o.   MRN: [id number]  Right closed comminuted distal peri-prosthetic femur fracture  Admission reviewed Needs ORIF, reviewed indications, risks and benefits  NPO Consent ordered To OR today

## 2016-04-12 NOTE — Anesthesia Preprocedure Evaluation (Addendum)
Anesthesia Evaluation  Patient identified by MRN, date of birth, ID band Patient awake  General Assessment Comment:History noted. Patient examined. CE   Reviewed: Allergy & Precautions, NPO status , Patient's Chart, lab work & pertinent test results  Airway Mallampati: I  TM Distance: >3 FB     Dental   Pulmonary neg pulmonary ROS,    breath sounds clear to auscultation       Cardiovascular hypertension,  Rhythm:Regular Rate:Normal     Neuro/Psych negative neurological ROS     GI/Hepatic negative GI ROS, Neg liver ROS,   Endo/Other  negative endocrine ROS  Renal/GU negative Renal ROS     Musculoskeletal   Abdominal   Peds  Hematology   Anesthesia Other Findings   Reproductive/Obstetrics                             Anesthesia Physical Anesthesia Plan  ASA: III  Anesthesia Plan: General   Post-op Pain Management:    Induction: Intravenous  Airway Management Planned: Oral ETT  Additional Equipment:   Intra-op Plan:   Post-operative Plan: Possible Post-op intubation/ventilation  Informed Consent: I have reviewed the patients History and Physical, chart, labs and discussed the procedure including the risks, benefits and alternatives for the proposed anesthesia with the patient or authorized representative who has indicated his/her understanding and acceptance.     Plan Discussed with: CRNA and Anesthesiologist  Anesthesia Plan Comments:         Anesthesia Quick Evaluation

## 2016-04-12 NOTE — Transfer of Care (Signed)
Immediate Anesthesia Transfer of Care Note  Patient: Amanda Berry  Procedure(s) Performed: Procedure(s): OPEN REDUCTION INTERNAL FIXATION (ORIF) RIGHT DISTAL FEMUR  PERIPROSTHETIC FRACTURE (Right)  Patient Location: PACU  Anesthesia Type:General  Level of Consciousness:  sedated, patient cooperative and responds to stimulation  Airway & Oxygen Therapy:Patient Spontanous Breathing and Patient connected to face mask oxgen  Post-op Assessment:  Report given to PACU RN and Post -op Vital signs reviewed and stable  Post vital signs:  Reviewed and stable  Last Vitals:  Vitals:   04/11/16 2139 04/12/16 0630  BP: 130/84 (!) 129/52  Pulse: 75 78  Resp: 18 18  Temp: 36.9 C [id number]    Complications: No apparent anesthesia complications

## 2016-04-12 NOTE — Anesthesia Postprocedure Evaluation (Signed)
Anesthesia Post Note  Patient: Amanda Berry  Procedure(s) Performed: Procedure(s) (LRB): OPEN REDUCTION INTERNAL FIXATION (ORIF) RIGHT DISTAL FEMUR  PERIPROSTHETIC FRACTURE (Right)  Patient location during evaluation: PACU Anesthesia Type: General Level of consciousness: awake Pain management: pain level controlled Vital Signs Assessment: post-procedure vital signs reviewed and stable Respiratory status: spontaneous breathing Cardiovascular status: stable Anesthetic complications: no    Last Vitals:  Vitals:   04/12/16 1650 04/12/16 1700  BP: 114/89 (!) 163/67  Pulse: 87 78  Resp: 18 (!) 21  Temp: 36.7 C     Last Pain:  Vitals:   04/12/16 1650  TempSrc:   PainSc: 0-No pain                 EDWARDS,Tracie Lindbloom

## 2016-04-13 ENCOUNTER — Encounter (HOSPITAL_COMMUNITY): Payer: Self-pay | Admitting: Orthopedic Surgery

## 2016-04-13 LAB — CBC
HCT: 23.5 % — ABNORMAL LOW (ref 36.0–46.0)
Hemoglobin: 8 g/dL — ABNORMAL LOW (ref 12.0–15.0)
MCH: 31 pg (ref 26.0–34.0)
MCHC: 34 g/dL (ref 30.0–36.0)
MCV: 91.1 fL (ref 78.0–100.0)
Platelets: 137 10*3/uL — ABNORMAL LOW (ref 150–400)
RBC: 2.58 MIL/uL — AB (ref 3.87–5.11)
RDW: 14.4 % (ref 11.5–15.5)
WBC: 8.5 10*3/uL (ref 4.0–10.5)

## 2016-04-13 LAB — BASIC METABOLIC PANEL
Anion gap: 7 (ref 5–15)
BUN: 13 mg/dL (ref 6–20)
CO2: 23 mmol/L (ref 22–32)
CREATININE: 0.62 mg/dL (ref 0.44–1.00)
Calcium: 7.9 mg/dL — ABNORMAL LOW (ref 8.9–10.3)
Chloride: 105 mmol/L (ref 101–111)
GFR calc Af Amer: >60 mL/min (ref >60)
GFR calc non Af Amer: >60 mL/min (ref >60)
Glucose, Bld: 150 mg/dL — ABNORMAL HIGH (ref 65–99)
POTASSIUM: 4.2 mmol/L (ref 3.5–5.1)
SODIUM: 135 mmol/L (ref 135–145)

## 2016-04-13 MED ORDER — PANTOPRAZOLE SODIUM 40 MG PO TBEC
40.0000 mg | DELAYED_RELEASE_TABLET | Freq: Every day | ORAL | Status: DC
  Administered 2016-04-13 – 2016-04-15 (×3): 40 mg via ORAL
  Filled 2016-04-13 (×3): qty 1

## 2016-04-13 NOTE — Op Note (Signed)
Amanda Berry, Amanda NO.:  [id number]  MEDICAL RECORD NO.:  [id number]  LOCATION:  [id number]                         FACILITY:  [facility]  PHYSICIAN:  Pietro Cassis. Alvan Dame, M.D.  DATE OF BIRTH:  1934-01-20  DATE OF PROCEDURE:  04/12/2016 DATE OF DISCHARGE:                              OPERATIVE REPORT   PREOPERATIVE DIAGNOSIS:  Closed right distal femur periprosthetic fracture.  POSTOPERATIVE DIAGNOSIS:  Closed right distal femur periprosthetic fracture.  PROCEDURE:  Open reduction and internal fixation of right periprosthetic femur fracture utilizing a retrograde nail through the knee replacement utilizing a [place] nail from [employer] 10.5 x 280 mm.  Four distal interlock screws, which were then locked into place and one proximal and one interlock.  SURGEON:  Pietro Cassis. Alvan Dame, M.D.  ASSISTANT:  Danae Orleans, PA-C.  Note that Mr. Amanda Berry was present for the entirety of the case from preoperative position, perioperative management of the operative extremity, general facilitation of the case and primary wound closure.  ANESTHESIA:  General.  SPECIMENS:  None.  COMPLICATION:  None apparent.  BLOOD LOSS:  Probably about 300 mL.  INDICATIONS FOR PROCEDURE:  Amanda Berry is an 82 year old female with history of a right total knee arthroplasty performed by myself probably at this point about 9 years ago.  She has been in her normal state of health, tolerated the knee replacement well until she fell while she was at church the day prior to the procedure.  She had immediate onset of pain, deformity and inability to bear weight. She was brought to the emergency room by EMS.  Radiographs in the hospital revealed severely comminuted and displaced distal femur periprosthetic fracture.  She was admitted through [facility] to my service for surgical treatment the next day.  She was seen and evaluated.  Risks, benefits and necessity of the procedure were discussed and  reviewed and we discussed the risk of infection, DVT, we will also stress the potential for malunion, nonunion, need for future surgery, which could include based on the distal nature of her fracture and conversion to a constrained distal femoral replacing component.  Consent was obtained for fracture management.  PROCEDURE IN DETAIL:  The patient was brought to the operative theater. Once adequate anesthesia, preop antibiotics, including an Ancef administered, she was positioned supine.  The right perineum was pre- draped out.  The right lower extremity was then prepped and draped in sterile fashion from above the iliac crest anteriorly to her ankle. Time-out was performed, identifying the patient, planned procedure and extremity.  Her old incision was identified and demarcated on the skin. I then exposed the skin and soft tissue and soft tissue planes created. I created a full median arthrotomy to help with reduction of the fracture, evacuate any cement from the metaphysis.  We evacuated a large hematoma from her knee.  At this point, with the knee exposed and the knee flexed, I was able to evaluate, manually palpate reduction, confirmed radiographically in AP and lateral planes.  Once this was done, we removed the plastic piece from the center portion of the femoral component and removed the cement within the canal.  I then placed a  ball-tipped guidewire, maintaining manual reduction and near anatomic reduction of this fracture.  The guidewire was passed into the proximal aspect of the femur.  With it in the area of the lesser trochanter, I measured it with the measuring device and selected a 280- mm nail.  At this point, the 10.5 x 280-mm nail was opened and attached to the insertion jig.  At this point, I also placed a large bone tenaculum on the fracture to maintain the reduction, I was holding manually.  The intramedullary rod was then passed through the notch of the femur past  the fracture site up to the proximal femur confirmed radiographically.  Once I felt it was comfortable with the orientation of the component in this location at the distal aspect of the femur, we used the jig to insert the four distal interlocking screws.  I placed the two transverse screws first and the one lateral to medial and then the medial to lateral.  All these were confirmed radiographically as well as by palpation based on the open nature of the knee.  I then used the locking device and tightened the screw down to make these locking screws, which was very beneficial based on the comminuted distal nature of the bone as well as bone quality.  Once it was completed, I then placed a proximal interlock under perfect circle technique through fluoroscopic imaging.  At this point, both AP and lateral radiographs were obtained in AP and lateral planes confirming maintenance of the fracture reduction and anatomically aligned on the lateral view with some posterior displaced fragment as well as anatomic anterior alignment.  The wounds were irrigated throughout the case again at this point.  The arthrotomy was reapproximated using #1 Vicryl and 0 V-Loc suture.  The remainder of the wound was closed with 2-0 Vicryl and running 3-0 Monocryl.  The proximal wound was closed with subcutaneous 2-0 Vicryl and then surgical glue.  The thigh and knee were cleaned, dried and dressed sterilely using surgical glue and an Aquacel dressing on the knee.  A smaller Aquacel placed in the proximal incision.  She was then placed in knee immobilizer.  She was woken from anesthesia and brought to the recovery room in stable condition tolerating the procedure well. Findings were reviewed with the family and postoperative course, so at this point, we will have to include the fact that she will be nonweightbearing for probably 6-8 weeks to allow for bony healing.  Knee range of motion will be permitted within  next 2 weeks.  We will need to decrease the stress across this distal fracture site to prevent excessive stress that can inhibit bone growth.     Pietro Cassis Alvan Dame, M.D.     MDO/MEDQ  D:  04/13/2016  T:  04/13/2016  Job:  [id number]

## 2016-04-13 NOTE — Progress Notes (Signed)
     Subjective: 1 Day Post-Op Procedure(s) (LRB): OPEN REDUCTION INTERNAL FIXATION (ORIF) RIGHT DISTAL FEMUR  PERIPROSTHETIC FRACTURE (Right)   Seen by Dr. Alvan Dame. Patient reports pain as moderate, pain controlled. No events throughout the night.  Eepressed some concern with having to be NWB for 2 months.  Planning on SNF upon discharge.  Objective:   VITALS:   Vitals:   04/13/16 0600 04/13/16 0925  BP: 108/85 (!) 123/51  Pulse: 70 84  Resp: 15 18  Temp: 98.7 F (37.1 C) 98.1 F (36.7 C)    Dorsiflexion/Plantar flexion intact Incision: dressing C/D/I No cellulitis present Compartment soft  LABS  Recent Labs  04/11/16 1302 04/13/16 0432  HGB 11.8* 8.0*  HCT 35.6* 23.5*  WBC 10.3 8.5  PLT 216 137*     Recent Labs  04/11/16 1302 04/13/16 0432  NA 138 135  K 4.0 4.2  BUN 18 13  CREATININE 0.69 0.62  GLUCOSE 119* 150*     Assessment/Plan: 1 Day Post-Op Procedure(s) (LRB): OPEN REDUCTION INTERNAL FIXATION (ORIF) RIGHT DISTAL FEMUR  PERIPROSTHETIC FRACTURE (Right) Advance diet Up with therapy D/C IV fluids Discharge to SNF eventually, when ready   Obese (BMI 30-39.9) Estimated body mass index is 31.05 kg/m as calculated from the following:   Height as of this encounter: 5' (1.524 m).   Weight as of this encounter: 72.1 kg (159 lb). Patient also counseled that weight may inhibit the healing process Patient counseled that losing weight will help with future health issues       Amanda Berry. Amanda Berry   PAC  04/13/2016, 11:00 AM

## 2016-04-13 NOTE — Evaluation (Signed)
Physical Therapy Evaluation Patient Details Name: Amanda Berry MRN: [id number] DOB: Apr 30, 1934 Today's Date: 04/13/2016   History of Present Illness  Pt is an 82 year old female s/p OPEN REDUCTION INTERNAL FIXATION (ORIF) RIGHT DISTAL FEMUR  PERIPROSTHETIC FRACTURE   Clinical Impression  Patient is s/p above surgery resulting in functional limitations due to the deficits listed below (see PT Problem List).  Patient will benefit from skilled PT to increase their independence and safety with mobility to allow discharge to the venue listed below.  Pt assisted with OOB to recliner today with R LE NWB status.  Pt would benefit from ST-SNF upon d/c.       Follow Up Recommendations SNF;Supervision/Assistance - 24 hour    Equipment Recommendations  None recommended by PT (TBD next venue)    Recommendations for Other Services       Precautions / Restrictions Precautions Precautions: Fall Required Braces or Orthoses: Knee Immobilizer - Right Restrictions Weight Bearing Restrictions: Yes RLE Weight Bearing: Non weight bearing      Mobility  Bed Mobility Overal bed mobility: Needs Assistance;+ 2 for safety/equipment Bed Mobility: Supine to Sit     Supine to sit: Mod assist;HOB elevated     General bed mobility comments: verbal cues for technique, assist for R LE and trunk upright  Transfers Overall transfer level: Needs assistance Equipment used: Rolling walker (2 wheeled) Transfers: Sit to/from [facility] Sit to Stand: +2 safety/equipment;From elevated surface;Mod assist Stand pivot transfers: +2 safety/equipment;Min assist       General transfer comment: verbal cues for UE and LE positioning, min assist from raised bed however mod assist to control descent to recliner, pt able to maintain NWB well  Ambulation/Gait                Stairs            Wheelchair Mobility    Modified Rankin (Stroke Patients Only)       Balance                                              Pertinent Vitals/Pain Pain Assessment: 0-10 Pain Score: 6  Pain Location: R thigh Pain Descriptors / Indicators: Aching;Sore Pain Intervention(s): Limited activity within patient's tolerance;Monitored during session;Repositioned;Ice applied;Patient requesting pain meds-RN notified    Home Living Family/patient expects to be discharged to:: Skilled nursing facility Living Arrangements: Spouse/significant other                    Prior Function Level of Independence: Independent               Hand Dominance        Extremity/Trunk Assessment   Upper Extremity Assessment: Overall WFL for tasks assessed           Lower Extremity Assessment: Defer to PT evaluation RLE Deficits / Details: maintained KI, pt unable to perform SLR, able to perform ankle pumps       Communication   Communication: No difficulties  Cognition Arousal/Alertness: Awake/alert Behavior During Therapy: WFL for tasks assessed/performed Overall Cognitive Status: Within Functional Limits for tasks assessed                      General Comments      Exercises        Assessment/Plan    PT  Assessment Patient needs continued PT services  PT Diagnosis Difficulty walking;Acute pain   PT Problem List Decreased strength;Decreased mobility;Decreased balance;Decreased knowledge of use of DME;Decreased knowledge of precautions;Pain  PT Treatment Interventions DME instruction;Gait training;Functional mobility training;Therapeutic activities;Patient/family education;Therapeutic exercise;Wheelchair mobility training   PT Goals (Current goals can be found in the Care Plan section) Acute Rehab PT Goals Patient Stated Goal: rehab, then home PT Goal Formulation: With patient Time For Goal Achievement: 04/20/16 Potential to Achieve Goals: Good    Frequency Min 4X/week   Barriers to discharge        Co-evaluation PT/OT/SLP  Co-Evaluation/Treatment: Yes Reason for Co-Treatment: For patient/therapist safety PT goals addressed during session: Mobility/safety with mobility OT goals addressed during session: ADL's and self-care       End of Session Equipment Utilized During Treatment: Gait belt;Right knee immobilizer Activity Tolerance: Patient limited by pain Patient left: in chair;with call bell/phone within reach;with chair alarm set;with family/visitor present Nurse Communication: Mobility status;Patient requests pain meds         Time: [id number] PT Time Calculation (min) (ACUTE ONLY): 18 min   Charges:   PT Evaluation $PT Eval Low Complexity: 1 Procedure     PT G Codes:        Amanda Berry,Amanda Berry 04/13/2016, 1:54 PM Amanda Berry, PT, DPT 04/13/2016 Pager: [phone]

## 2016-04-13 NOTE — Brief Op Note (Signed)
04/12/2016  5:00PM  PATIENT:  Amanda Berry  82 y.o. female  PRE-OPERATIVE DIAGNOSIS:  Closed right distal femur periprosthetic fracture  POST-OPERATIVE DIAGNOSIS:  Closed right distal femur periprosthetic fracture  PROCEDURE:  Procedure(s): OPEN REDUCTION INTERNAL FIXATION (ORIF) RIGHT DISTAL FEMUR  PERIPROSTHETIC FRACTURE (Right)  SURGEON:  Surgeon(s) and Role:    * Paralee Cancel, MD - Primary  PHYSICIAN ASSISTANT: Danae Orleans, PA-C  ANESTHESIA:   general  EBL:  Total I/O In: 240 [P.O.:240] Out: 400 [Urine:400]  BLOOD ADMINISTERED:none  DRAINS: none   LOCAL MEDICATIONS USED:  NONE  SPECIMEN:  No Specimen  DISPOSITION OF SPECIMEN:  N/A  COUNTS:  YES  TOURNIQUET:  * No tourniquets in log *  DICTATION: .Other Dictation: Dictation Number [id number]  PLAN OF CARE: Admit to inpatient   PATIENT DISPOSITION:  PACU - hemodynamically stable.   Delay start of Pharmacological VTE agent (>24hrs) due to surgical blood loss or risk of bleeding: no

## 2016-04-13 NOTE — Clinical Social Work Note (Signed)
Clinical Social Work Assessment  Patient Details  Name: Amanda Berry MRN: [id number] Date of Birth: October 02, 1933  Date of referral:  04/13/16               Reason for consult:  Facility Placement, Discharge Planning                Permission sought to share information with:  Chartered certified accountant granted to share information::  Yes, Verbal Permission Granted  Name::        Agency::     Relationship::     Contact Information:     Housing/Transportation Living arrangements for the past 2 months:  Single Family Home Source of Information:  Patient, Adult Children Patient Interpreter Needed:  None Criminal Activity/Legal Involvement Pertinent to Current Situation/Hospitalization:  No - Comment as needed Significant Relationships:  Adult Children, Spouse Lives with:  Spouse Do you feel safe going back to the place where you live?  No Need for family participation in patient care:  Yes (Comment)  Care giving concerns:  Pt's care cannot be managed at home following hospital d/c.   Social Worker assessment / plan: Pt hospitalized from home on 04/11/16 with a femur fx. Surgery has been completed. PT has recommended SNF at d/c. CSW has met with pt / family to assist with d/c planning. Pt / family are in agreement with ST Rehab placement at d/c. SNF search has been initiated and bed offers are pending. Pt has [employer] medicare which requires prior authorization for SNF. CSW will assist with authorization process. CSW will continue to follow to assist with d/c planning needs.  Employment status:  Retired Nurse, adult PT Recommendations:  [place] / Referral to community resources:  [place]  Patient/Family's Response to care:  Pt / family are in agreement with ST Rehab placement.  Patient/Family's Understanding of and Emotional Response to Diagnosis, Current Treatment, and Prognosis:  Pt  / family are aware of pt's medical dx. Pt / family are relieved surgery is over and all went well. " My doctor told me I'm going to need rehab. I'm a little nervous about that. " Support / reassurance provided.  Emotional Assessment Appearance:  Appears stated age Attitude/Demeanor/Rapport:  Other (cooperative) Affect (typically observed):  Pleasant, Appropriate Orientation:  Oriented to Self, Oriented to Place, Oriented to  Time, Oriented to Situation Alcohol / Substance use:  Not Applicable Psych involvement (Current and /or in the community):  No (Comment)  Discharge Needs  Concerns to be addressed:  Discharge Planning Concerns Readmission within the last 30 days:  No Current discharge risk:  None Barriers to Discharge:  No Barriers Identified   Loraine Maple  [phone] 04/13/2016, 2:41 PM

## 2016-04-13 NOTE — Clinical Social Work Placement (Signed)
   CLINICAL SOCIAL WORK PLACEMENT  NOTE  Date:  04/13/2016  Patient Details  Name: Amanda Berry MRN: [id number] Date of Birth: June 22, 1934  Clinical Social Work is seeking post-discharge placement for this patient at the [place] level of care (*CSW will initial, date and re-position this form in  chart as items are completed):  Yes   Patient/family provided with [place] Work Department's list of facilities offering this level of care within the geographic area requested by the patient (or if unable, by the patient's family).  Yes   Patient/family informed of their freedom to choose among providers that offer the needed level of care, that participate in Medicare, Medicaid or managed care program needed by the patient, have an available bed and are willing to accept the patient.  Yes   Patient/family informed of [place]'s ownership interest in [facility] and [facility], as well as of the fact that they are under no obligation to receive care at these facilities.  PASRR submitted to EDS on 04/13/16     PASRR number received on 04/13/16     Existing PASRR number confirmed on       FL2 transmitted to all facilities in geographic area requested by pt/family on 04/13/16     FL2 transmitted to all facilities within larger geographic area on       Patient informed that his/her managed care company has contracts with or will negotiate with certain facilities, including the following:            Patient/family informed of bed offers received.  Patient chooses bed at       Physician recommends and patient chooses bed at      Patient to be transferred to   on  .  Patient to be transferred to facility by       Patient family notified on   of transfer.  Name of family member notified:        PHYSICIAN       Additional Comment:    _______________________________________________ Luretha Rued, [place] 04/13/2016, 2:49  PM

## 2016-04-13 NOTE — NC FL2 (Signed)
[place] LEVEL OF CARE SCREENING TOOL     IDENTIFICATION  Patient Name: Amanda Berry Birthdate: [birth year] Sex: female Admission Date (Current Location): 04/11/2016  [facility] [place] Number:  Herbalist and Address:  [facility],  [place] [address], [place]      Provider Number: [id number]  Attending Physician Name and Address:  Paralee Cancel, MD  Relative Name and Phone Number:       Current Level of Care: Hospital Recommended Level of Care: [place] Prior Approval Number:    Date Approved/Denied:   PASRR Number: [id number]  Discharge Plan: SNF    Current Diagnoses: Patient Active Problem List   Diagnosis Date Noted  . Femur fracture, right, closed, initial encounter 04/11/2016    Orientation RESPIRATION BLADDER Height & Weight     Self, Time, Situation, Place  Normal Indwelling catheter Weight: 159 lb (72.1 kg) Height:  5' (152.4 cm)  BEHAVIORAL SYMPTOMS/MOOD NEUROLOGICAL BOWEL NUTRITION STATUS  Other (Comment) (no behaviors)   Continent Diet  AMBULATORY STATUS COMMUNICATION OF NEEDS Skin   Extensive Assist Verbally Surgical wounds                       Personal Care Assistance Level of Assistance  Bathing, Feeding, Dressing Bathing Assistance: Maximum assistance Feeding assistance: Maximum assistance Dressing Assistance: Maximum assistance     Functional Limitations Info  Sight, Hearing, Speech Sight Info: Adequate Hearing Info: Adequate Speech Info: Adequate    SPECIAL CARE FACTORS FREQUENCY  PT (By licensed PT), OT (By licensed OT)     PT Frequency: 5 x wk OT Frequency: 5 x wk            Contractures Contractures Info: Not present    Additional Factors Info  Code Status Code Status Info: Full Code             Current Medications (04/13/2016):  This is the current hospital active medication list Current Facility-Administered Medications  Medication Dose Route  Frequency Provider Last Rate Last Dose  . alum & mag hydroxide-simeth (MAALOX/MYLANTA) 200-200-20 MG/5ML suspension 30 mL  30 mL Oral Q4H PRN Danae Orleans, PA-C   30 mL at 04/13/16 1122  . aspirin EC tablet 325 mg  325 mg Oral BID Danae Orleans, PA-C   325 mg at 04/13/16 [id number]  . bisacodyl (DULCOLAX) suppository 10 mg  10 mg Rectal Daily PRN Danae Orleans, PA-C      . docusate sodium (COLACE) capsule 100 mg  100 mg Oral BID Danae Orleans, PA-C   100 mg at 04/13/16 0857  . ferrous sulfate tablet 325 mg  325 mg Oral TID PC Danae Orleans, PA-C   325 mg at 04/13/16 0857  . HYDROcodone-acetaminophen (NORCO/VICODIN) 5-325 MG per tablet 1-2 tablet  1-2 tablet Oral Q4H PRN Cecilie Kicks, PA-C   2 tablet at 04/13/16 0857  . HYDROmorphone (DILAUDID) injection 0.5 mg  0.5 mg Intravenous Q2H PRN Margarita Mail, PA-C   0.5 mg at 04/11/16 1442  . magnesium citrate solution 1 Bottle  1 Bottle Oral Once PRN Danae Orleans, PA-C      . menthol-cetylpyridinium (CEPACOL) lozenge 3 mg  1 lozenge Oral PRN Danae Orleans, PA-C       Or  . phenol (CHLORASEPTIC) mouth spray 1 spray  1 spray Mouth/Throat PRN Danae Orleans, PA-C      . methocarbamol (ROBAXIN) tablet 500 mg  500 mg Oral Q6H PRN Danae Orleans,  PA-C       Or  . methocarbamol (ROBAXIN) 500 mg in dextrose 5 % 50 mL IVPB  500 mg Intravenous Q6H PRN Danae Orleans, PA-C   500 mg at 04/12/16 2149  . metoCLOPramide (REGLAN) tablet 5-10 mg  5-10 mg Oral Q8H PRN Danae Orleans, PA-C       Or  . metoCLOPramide (REGLAN) injection 5-10 mg  5-10 mg Intravenous Q8H PRN Danae Orleans, PA-C      . ondansetron [facility]) tablet 4 mg  4 mg Oral Q6H PRN Danae Orleans, PA-C       Or  . ondansetron [facility]) injection 4 mg  4 mg Intravenous Q6H PRN Danae Orleans, PA-C      . polyethylene glycol (MIRALAX / GLYCOLAX) packet 17 g  17 g Oral BID Danae Orleans, PA-C   17 g at 04/13/16 0857  . sodium chloride 0.9 % 1,000 mL with potassium chloride 10 mEq infusion    Intravenous Continuous Danae Orleans, PA-C   Stopped at 04/13/16 1200     Discharge Medications: Please see discharge summary for a list of discharge medications.  Relevant Imaging Results:  Relevant Lab Results:   Additional Information SS # [id number]  Sweta Halseth, [place] An, LCSW

## 2016-04-13 NOTE — Progress Notes (Signed)
Occupational Therapy Evaluation Patient Details Name: Amanda Berry MRN: [id number] DOB: 01/03/1934 Today's Date: 04/13/2016    History of Present Illness Pt is an 82 year old female s/p OPEN REDUCTION INTERNAL FIXATION (ORIF) RIGHT DISTAL FEMUR  PERIPROSTHETIC FRACTURE    Clinical Impression   Patient is s/p above procedure and presents with decreased ADL independence and safety due to the deficits listed below. She will benefit from skilled OT to maximize function and to facilitate a safe discharge. OT will follow.    Follow Up Recommendations  SNF    Equipment Recommendations  Other (comment) (tbd at next venue of care)    Recommendations for Other Services       Precautions / Restrictions Precautions Precautions: Fall Required Braces or Orthoses: Knee Immobilizer - Right Restrictions Weight Bearing Restrictions: Yes RLE Weight Bearing: Non weight bearing      Mobility Bed Mobility Overal bed mobility: Needs Assistance;+ 2 for safety/equipment Bed Mobility: Supine to Sit     Supine to sit: Mod assist;HOB elevated     General bed mobility comments: verbal cues for technique, assist for R LE and trunk upright  Transfers Overall transfer level: Needs assistance Equipment used: Rolling walker (2 wheeled) Transfers: Sit to/from [facility] Sit to Stand: +2 safety/equipment;From elevated surface;Mod assist Stand pivot transfers: +2 safety/equipment;Min assist       General transfer comment: verbal cues for UE and LE positioning, min assist from raised bed however mod assist to control descent to recliner, pt able to maintain NWB well    Balance                                            ADL Overall ADL's : Needs assistance/impaired Eating/Feeding: Set up;Sitting   Grooming: Set up;Wash/dry face;Sitting           Upper Body Dressing : Sitting;Minimal assistance Upper Body Dressing Details (indicate cue type and  reason): don gown as robe Lower Body Dressing: Total assistance;Sit to/from stand;Bed level   Toilet Transfer: Moderate assistance;+2 for physical assistance;+2 for safety/equipment;Stand-pivot;BSC Toilet Transfer Details (indicate cue type and reason): simulated bed to recliner Toileting- Clothing Manipulation and Hygiene: Total assistance       Functional mobility during ADLs: Moderate assistance;Rolling walker;+2 for physical assistance;+2 for safety/equipment       Vision     Perception     Praxis      Pertinent Vitals/Pain Pain Assessment: 0-10 Pain Score: 6  Pain Location: R thigh Pain Descriptors / Indicators: Aching;Sore Pain Intervention(s): Limited activity within patient's tolerance;Monitored during session;Repositioned;Ice applied;Patient requesting pain meds-RN notified     Hand Dominance     Extremity/Trunk Assessment Upper Extremity Assessment Upper Extremity Assessment: Overall WFL for tasks assessed   Lower Extremity Assessment Lower Extremity Assessment: Defer to PT evaluation RLE Deficits / Details: maintained KI, pt unable to perform SLR, able to perform ankle pumps       Communication Communication Communication: No difficulties   Cognition Arousal/Alertness: Awake/alert Behavior During Therapy: WFL for tasks assessed/performed Overall Cognitive Status: Within Functional Limits for tasks assessed                     General Comments       Exercises       Shoulder Instructions      Home Living Family/patient expects to be discharged to:: Skilled  nursing facility Living Arrangements: Spouse/significant other                                      Prior Functioning/Environment Level of Independence: Independent             OT Diagnosis: Acute pain   OT Problem List: Decreased strength;Decreased activity tolerance;Impaired balance (sitting and/or standing);Decreased knowledge of use of DME or AE;Decreased  knowledge of precautions;Pain   OT Treatment/Interventions: Self-care/ADL training;DME and/or AE instruction;Therapeutic activities;Patient/family education    OT Goals(Current goals can be found in the care plan section) Acute Rehab OT Goals Patient Stated Goal: rehab, then home OT Goal Formulation: With patient Time For Goal Achievement: 04/27/16 Potential to Achieve Goals: Good ADL Goals Pt Will Perform Lower Body Bathing: with min assist;sit to/from stand Pt Will Perform Lower Body Dressing: with min assist;sit to/from stand Pt Will Transfer to Toilet: with min assist;bedside commode Pt Will Perform Toileting - Clothing Manipulation and hygiene: with min assist;sit to/from stand  OT Frequency: Min 2X/week   Barriers to D/C:            Co-evaluation PT/OT/SLP Co-Evaluation/Treatment: Yes Reason for Co-Treatment: For patient/therapist safety PT goals addressed during session: Mobility/safety with mobility OT goals addressed during session: ADL's and self-care      End of Session Equipment Utilized During Treatment: Gait belt;Rolling walker;Right knee immobilizer Nurse Communication: Patient requests pain meds;Mobility status  Activity Tolerance: Patient tolerated treatment well Patient left: in chair;with call bell/phone within reach;with bed alarm set;with family/visitor present   Time: [id number] OT Time Calculation (min): 19 min Charges:  OT General Charges $OT Visit: 1 Procedure OT Evaluation $OT Eval Low Complexity: 1 Procedure G-Codes:    Amanda Berry A April 16, 2016, 1:17 PM

## 2016-04-14 LAB — BASIC METABOLIC PANEL
Anion gap: 4 — ABNORMAL LOW (ref 5–15)
BUN: 21 mg/dL — AB (ref 6–20)
CHLORIDE: 106 mmol/L (ref 101–111)
CO2: 25 mmol/L (ref 22–32)
Calcium: 7.8 mg/dL — ABNORMAL LOW (ref 8.9–10.3)
Creatinine, Ser: 0.73 mg/dL (ref 0.44–1.00)
GFR calc Af Amer: >60 mL/min (ref >60)
GFR calc non Af Amer: >60 mL/min (ref >60)
GLUCOSE: 103 mg/dL — AB (ref 65–99)
POTASSIUM: 4 mmol/L (ref 3.5–5.1)
SODIUM: 135 mmol/L (ref 135–145)

## 2016-04-14 LAB — CBC
HCT: 21.4 % — ABNORMAL LOW (ref 36.0–46.0)
HEMOGLOBIN: 7.3 g/dL — AB (ref 12.0–15.0)
MCH: 31.1 pg (ref 26.0–34.0)
MCHC: 34.1 g/dL (ref 30.0–36.0)
MCV: 91.1 fL (ref 78.0–100.0)
Platelets: 126 10*3/uL — ABNORMAL LOW (ref 150–400)
RBC: 2.35 MIL/uL — AB (ref 3.87–5.11)
RDW: 15 % (ref 11.5–15.5)
WBC: 9.6 10*3/uL (ref 4.0–10.5)

## 2016-04-14 NOTE — Progress Notes (Signed)
     Subjective: 2 Days Post-Op Procedure(s) (LRB): OPEN REDUCTION INTERNAL FIXATION (ORIF) RIGHT DISTAL FEMUR  PERIPROSTHETIC FRACTURE (Right)   Patient reports pain as mild, pain controlled. No events throughout the night. Feels that she did well with PT. Understands the need for the restriction, but also feels that they are not going to make her life easy for a couple of months.  Discussed SNF upon discharge.  Objective:   VITALS:   Vitals:   04/14/16 0615 04/14/16 0800  BP: (!) 114/57 (!) 113/56  Pulse: 94   Resp: 16   Temp: 98.3 F (36.8 C)     Dorsiflexion/Plantar flexion intact Incision: dressing C/D/I No cellulitis present Compartment soft  LABS  Recent Labs  04/11/16 1302 04/13/16 0432 04/14/16 0439  HGB 11.8* 8.0* 7.3*  HCT 35.6* 23.5* 21.4*  WBC 10.3 8.5 9.6  PLT 216 137* 126*     Recent Labs  04/11/16 1302 04/13/16 0432 04/14/16 0439  NA 138 135 135  K 4.0 4.2 4.0  BUN 18 13 21*  CREATININE 0.69 0.62 0.73  GLUCOSE 119* 150* 103*     Assessment/Plan: 2 Days Post-Op Procedure(s) (LRB): OPEN REDUCTION INTERNAL FIXATION (ORIF) RIGHT DISTAL FEMUR  PERIPROSTHETIC FRACTURE (Right) Up with therapy Discharge to SNF eventually, when ready    [place]. Amanda Berry   PAC  04/14/2016, 9:12 AM

## 2016-04-14 NOTE — Progress Notes (Signed)
Physical Therapy Treatment Note    04/14/16 1600  PT Visit Information  Last PT Received On 04/14/16  Assistance Needed +2  History of Present Illness Pt is an 82 year old female s/p OPEN REDUCTION INTERNAL FIXATION (ORIF) RIGHT DISTAL FEMUR  PERIPROSTHETIC FRACTURE   Subjective Data  Subjective Pt requested PT assist her back to bed.  Pt continues to require physical assist for mobility.  Continue to recommend SNF upon d/c.  Precautions  Precautions Fall  Precaution Comments No ROM for 2 weeks per op note  Required Braces or Orthoses Knee Immobilizer - Right  Restrictions  Weight Bearing Restrictions Yes  RLE Weight Bearing NWB  Pain Assessment  Pain Assessment 0-10  Pain Score 5  Pain Location R thigh  Pain Descriptors / Indicators Aching;Sore  Pain Intervention(s) Limited activity within patient's tolerance;Monitored during session;Repositioned  Cognition  Arousal/Alertness Awake/alert  Behavior During Therapy WFL for tasks assessed/performed  Overall Cognitive Status Within Functional Limits for tasks assessed  Bed Mobility  Overal bed mobility Needs Assistance;+ 2 for safety/equipment  Bed Mobility Sit to Supine  Sit to supine Mod assist;+2 for physical assistance  General bed mobility comments assist for trunk lowering and LEs onto bed  Transfers  Overall transfer level Needs assistance  Equipment used Rolling walker (2 wheeled)  Transfers Sit to/from Stand  Sit to Stand Mod assist  Stand pivot transfers Mod assist  General transfer comment verbal cues for UE and LE positioning, assist for rise, steady; pt attempting to sit requiring assist to bed for safe descent, no dizziness reported this afternoon, pt able to maintain NWB well  PT - End of Session  Equipment Utilized During Treatment Gait belt;Right knee immobilizer  Activity Tolerance Patient limited by fatigue;Patient limited by pain  Patient left in bed;with call bell/phone within reach;with bed alarm set;with  nursing/sitter in room  PT - Assessment/Plan  PT Plan Current plan remains appropriate  PT Frequency (ACUTE ONLY) Min 4X/week  Follow Up Recommendations SNF;Supervision/Assistance - 24 hour  PT equipment None recommended by PT  PT Goal Progression  Progress towards PT goals Progressing toward goals  PT Time Calculation  PT Start Time (ACUTE ONLY) 1430  PT Stop Time (ACUTE ONLY) 1441  PT Time Calculation (min) (ACUTE ONLY) 11 min  PT General Charges  $$ ACUTE PT VISIT 1 Procedure  PT Treatments  $Therapeutic Activity 8-22 mins   Carmelia Bake, PT, DPT 04/14/2016 Pager: [phone]

## 2016-04-14 NOTE — Progress Notes (Signed)
Physical Therapy Treatment Patient Details Name: Amanda Berry MRN: [id number] DOB: 1933/12/20 Today's Date: 04/14/2016    History of Present Illness Pt is an 82 year old female s/p OPEN REDUCTION INTERNAL FIXATION (ORIF) RIGHT DISTAL FEMUR  PERIPROSTHETIC FRACTURE     PT Comments    Pt assisted up to recliner today and reports more soreness and dizziness then yesterday.  Pt educated to maintain KI and not perform ROM until cleared by MD.  Follow Up Recommendations  SNF;Supervision/Assistance - 24 hour     Equipment Recommendations  None recommended by PT (TBD next venue)    Recommendations for Other Services       Precautions / Restrictions Precautions Precautions: Fall Precaution Comments: No ROM for 2 weeks per op note Required Braces or Orthoses: Knee Immobilizer - Right Restrictions Weight Bearing Restrictions: Yes RLE Weight Bearing: Non weight bearing    Mobility  Bed Mobility Overal bed mobility: Needs Assistance;+ 2 for safety/equipment Bed Mobility: Supine to Sit     Supine to sit: Mod assist;HOB elevated     General bed mobility comments: verbal cues for technique, assist for R LE and trunk upright  Transfers Overall transfer level: Needs assistance Equipment used: Rolling walker (2 wheeled) Transfers: Sit to/from [facility] Sit to Stand: From elevated surface;Mod assist Stand pivot transfers: Mod assist       General transfer comment: verbal cues for UE and LE positioning, assist for rise, steady and controlling descent, pt with more difficulty rising today, pt reports dizziness upon standing today, pt able to maintain NWB well  Ambulation/Gait                 Stairs            Wheelchair Mobility    Modified Rankin (Stroke Patients Only)       Balance                                    Cognition Arousal/Alertness: Awake/alert Behavior During Therapy: WFL for tasks assessed/performed Overall  Cognitive Status: Within Functional Limits for tasks assessed                      Exercises      General Comments        Pertinent Vitals/Pain Pain Assessment: 0-10 Pain Score: 5  Pain Location: thigh also reports "soreness" all over from fall PTA Pain Descriptors / Indicators: Aching;Sore Pain Intervention(s): Limited activity within patient's tolerance;Monitored during session;Repositioned;Ice applied    Home Living                      Prior Function            PT Goals (current goals can now be found in the care plan section) Progress towards PT goals: Progressing toward goals    Frequency  Min 4X/week    PT Plan Current plan remains appropriate    Co-evaluation             End of Session Equipment Utilized During Treatment: Gait belt;Right knee immobilizer Activity Tolerance: Patient limited by pain;Patient limited by fatigue Patient left: in chair;with call bell/phone within reach;with chair alarm set;with family/visitor present     Time: [id number] PT Time Calculation (min) (ACUTE ONLY): 16 min  Charges:  $Therapeutic Activity: 8-22 mins  G Codes:      Marybel Alcott,KATHrine E 04-25-16, 12:46 PM  Carmelia Bake, PT, DPT 25-Apr-2016 Pager: [phone]

## 2016-04-15 DIAGNOSIS — S728X9A Other fracture of unspecified femur, initial encounter for closed fracture: Secondary | ICD-10-CM | POA: Diagnosis not present

## 2016-04-15 DIAGNOSIS — I10 Essential (primary) hypertension: Secondary | ICD-10-CM | POA: Diagnosis not present

## 2016-04-15 DIAGNOSIS — R262 Difficulty in walking, not elsewhere classified: Secondary | ICD-10-CM | POA: Diagnosis not present

## 2016-04-15 DIAGNOSIS — Z9181 History of falling: Secondary | ICD-10-CM | POA: Diagnosis not present

## 2016-04-15 DIAGNOSIS — S79911A Unspecified injury of right hip, initial encounter: Secondary | ICD-10-CM | POA: Diagnosis not present

## 2016-04-15 DIAGNOSIS — M979XXD Periprosthetic fracture around unspecified internal prosthetic joint, subsequent encounter: Secondary | ICD-10-CM | POA: Diagnosis not present

## 2016-04-15 DIAGNOSIS — T84124D Displacement of internal fixation device of right femur, subsequent encounter: Secondary | ICD-10-CM | POA: Diagnosis not present

## 2016-04-15 DIAGNOSIS — M6281 Muscle weakness (generalized): Secondary | ICD-10-CM | POA: Diagnosis not present

## 2016-04-15 DIAGNOSIS — Z4789 Encounter for other orthopedic aftercare: Secondary | ICD-10-CM | POA: Diagnosis not present

## 2016-04-15 DIAGNOSIS — S7991 Unspecified injury of hip: Secondary | ICD-10-CM | POA: Diagnosis not present

## 2016-04-15 LAB — BASIC METABOLIC PANEL
Anion gap: 6 (ref 5–15)
BUN: 23 mg/dL — AB (ref 6–20)
CALCIUM: 7.9 mg/dL — AB (ref 8.9–10.3)
CO2: 25 mmol/L (ref 22–32)
CREATININE: 0.63 mg/dL (ref 0.44–1.00)
Chloride: 105 mmol/L (ref 101–111)
GFR calc Af Amer: >60 mL/min (ref >60)
GLUCOSE: 107 mg/dL — AB (ref 65–99)
Potassium: 4.6 mmol/L (ref 3.5–5.1)
Sodium: 136 mmol/L (ref 135–145)

## 2016-04-15 LAB — CBC
HEMATOCRIT: 21.2 % — AB (ref 36.0–46.0)
Hemoglobin: 7.1 g/dL — ABNORMAL LOW (ref 12.0–15.0)
MCH: 30.9 pg (ref 26.0–34.0)
MCHC: 33.5 g/dL (ref 30.0–36.0)
MCV: 92.2 fL (ref 78.0–100.0)
PLATELETS: 131 10*3/uL — AB (ref 150–400)
RBC: 2.3 MIL/uL — ABNORMAL LOW (ref 3.87–5.11)
RDW: 15.4 % (ref 11.5–15.5)
WBC: 8.8 10*3/uL (ref 4.0–10.5)

## 2016-04-15 MED ORDER — FERROUS SULFATE 325 (65 FE) MG PO TABS: 325.0000 mg | ORAL_TABLET | Freq: Three times a day (TID) | ORAL | 3 refills | Status: DC

## 2016-04-15 MED ORDER — DOCUSATE SODIUM 100 MG PO CAPS: 100.0000 mg | ORAL_CAPSULE | Freq: Two times a day (BID) | ORAL | 0 refills | Status: DC

## 2016-04-15 MED ORDER — POLYETHYLENE GLYCOL 3350 17 G PO PACK: 17.0000 g | PACK | Freq: Two times a day (BID) | ORAL | 0 refills | Status: DC

## 2016-04-15 MED ORDER — ASPIRIN 325 MG PO TBEC: 325.0000 mg | DELAYED_RELEASE_TABLET | Freq: Two times a day (BID) | ORAL | 0 refills | Status: DC

## 2016-04-15 MED ORDER — TIZANIDINE HCL 4 MG PO TABS: 4.0000 mg | ORAL_TABLET | Freq: Four times a day (QID) | ORAL | 0 refills | Status: DC | PRN

## 2016-04-15 MED ORDER — HYDROCODONE-ACETAMINOPHEN 5-325 MG PO TABS: 1.0000 | ORAL_TABLET | ORAL | 0 refills | Status: DC | PRN

## 2016-04-15 NOTE — Care Management Important Message (Signed)
Important Message  Patient Details  Name: Amanda Berry MRN: [id number] Date of Birth: [birth year]   Medicare Important Message Given:  Yes    Camillo Flaming 04/15/2016, 10:38 AMImportant Message  Patient Details  Name: Amanda Berry MRN: [id number] Date of Birth: [birth year]   Medicare Important Message Given:  Yes    Camillo Flaming 04/15/2016, 10:37 AM

## 2016-04-15 NOTE — Clinical Social Work Placement (Addendum)
   CLINICAL SOCIAL WORK PLACEMENT  NOTE  Date:  04/15/2016  Patient Details  Name: Amanda Berry MRN: [id number] Date of Birth: 07/05/1934  Clinical Social Work is seeking post-discharge placement for this patient at the [place] level of care (*CSW will initial, date and re-position this form in  chart as items are completed):  Yes   Patient/family provided with [employer] Work Department's list of facilities offering this level of care within the geographic area requested by the patient (or if unable, by the patient's family).  Yes   Patient/family informed of their freedom to choose among providers that offer the needed level of care, that participate in Medicare, Medicaid or managed care program needed by the patient, have an available bed and are willing to accept the patient.  Yes   Patient/family informed of [place]'s ownership interest in [facility] and [facility], as well as of the fact that they are under no obligation to receive care at these facilities.  PASRR submitted to EDS on 04/13/16     PASRR number received on 04/13/16     Existing PASRR number confirmed on       FL2 transmitted to all facilities in geographic area requested by pt/family on 04/13/16     FL2 transmitted to all facilities within larger geographic area on       Patient informed that his/her managed care company has contracts with or will negotiate with certain facilities, including the following:        Yes   Patient/family informed of bed offers received.  Patient chooses bed at [facility]     Physician recommends and patient chooses bed at      Patient to be transferred to [facility] on 04/15/16.  Patient to be transferred to facility by [facility]     Patient family notified on 04/15/16 of transfer.  Name of family member notified:  DAUGHTER     PHYSICIAN       Additional Comment: Pt / daughter are in agreement with d/c to [facility]  today. Health Team Adv. Medicare has authorized SNF placement. # [id number]. [facility] transport required. Medical necessity form completed. Pt is aware that out of pocket costs may be associated with [facility] transport. D/C Summary sent to SNF for review. Scripts included in d/c packet. # for report provided to nsg.   _______________________________________________ Luretha Rued, [place]  [phone] 04/15/2016, 3:03 PM

## 2016-04-15 NOTE — Progress Notes (Signed)
Physical Therapy Treatment Patient Details Name: Amanda Berry MRN: [id number] DOB: 11-17-1933 Today's Date: 2016/04/29    History of Present Illness Pt is an 82 year old female s/p OPEN REDUCTION INTERNAL FIXATION (ORIF) RIGHT DISTAL FEMUR  PERIPROSTHETIC FRACTURE     PT Comments    Pt assisted OOB to recliner again today and performed a little better then yesterday.  Pt to d/c to SNF today.  Follow Up Recommendations  SNF;Supervision/Assistance - 24 hour     Equipment Recommendations  None recommended by PT    Recommendations for Other Services       Precautions / Restrictions Precautions Precautions: Fall Precaution Comments: No ROM for 2 weeks per op note Required Braces or Orthoses: Knee Immobilizer - Right Restrictions Weight Bearing Restrictions: Yes RLE Weight Bearing: Non weight bearing    Mobility  Bed Mobility Overal bed mobility: Needs Assistance Bed Mobility: Supine to Sit     Supine to sit: HOB elevated;Min assist     General bed mobility comments: verbal cues for technique, assist for R LE   Transfers Overall transfer level: Needs assistance Equipment used: Rolling walker (2 wheeled) Transfers: Sit to/from Stand Sit to Stand: From elevated surface;Mod assist Stand pivot transfers: Min assist       General transfer comment: verbal cues for UE and LE positioning, assist for rise, steady and controlling descent, pt reports transfer was a little easier today, does not hop well so swivelled on L LE  Ambulation/Gait                 Stairs            Wheelchair Mobility    Modified Rankin (Stroke Patients Only)       Balance                                    Cognition Arousal/Alertness: Awake/alert Behavior During Therapy: WFL for tasks assessed/performed Overall Cognitive Status: Within Functional Limits for tasks assessed                      Exercises      General Comments        Pertinent  Vitals/Pain Pain Assessment: 0-10 Pain Score: 4  Pain Location: R thigh Pain Descriptors / Indicators: Aching;Sore Pain Intervention(s): Limited activity within patient's tolerance;Monitored during session;Repositioned;Ice applied    Home Living                      Prior Function            PT Goals (current goals can now be found in the care plan section) Progress towards PT goals: Progressing toward goals    Frequency  Min 4X/week    PT Plan Current plan remains appropriate    Co-evaluation             End of Session Equipment Utilized During Treatment: Gait belt;Right knee immobilizer Activity Tolerance: Patient tolerated treatment well Patient left: in chair;with call bell/phone within reach;with family/visitor present     Time: 1043-1100 PT Time Calculation (min) (ACUTE ONLY): 17 min  Charges:  $Therapeutic Activity: 8-22 mins                    G Codes:      Loral Campi,KATHrine E 2016/04/29, 12:56 PM Carmelia Bake, PT, DPT 04/29/2016 Pager: [phone]

## 2016-04-15 NOTE — Progress Notes (Signed)
     Subjective: 3 Days Post-Op Procedure(s) (LRB): OPEN REDUCTION INTERNAL FIXATION (ORIF) RIGHT DISTAL FEMUR  PERIPROSTHETIC FRACTURE (Right)   Patient reports pain as mild, pain controlled.  No events throughout the night.  Patient denies any symptoms of weakness, dizziness of fatigue.  She does state that she is a little nervous with not being able to bare weight on the left leg until this is healed.  However she does know that this is necessary for the short term to allow the leg to heal.  Objective:   VITALS:   Vitals:   04/14/16 2149 04/15/16 0525  BP: (!) 124/54 (!) 117/53  Pulse: (!) 101 99  Resp: 16 18  Temp: 98.3 F (36.8 C) 99.2 F (37.3 C)    Dorsiflexion/Plantar flexion intact Incision: dressing C/D/I No cellulitis present Compartment soft  LABS  Recent Labs  04/13/16 0432 04/14/16 0439 04/15/16 0420  HGB 8.0* 7.3* 7.1*  HCT 23.5* 21.4* 21.2*  WBC 8.5 9.6 8.8  PLT 137* 126* 131*     Recent Labs  04/13/16 0432 04/14/16 0439 04/15/16 0420  NA 135 135 136  K 4.2 4.0 4.6  BUN 13 21* 23*  CREATININE 0.62 0.73 0.63  GLUCOSE 150* 103* 107*     Assessment/Plan: 3 Days Post-Op Procedure(s) (LRB): OPEN REDUCTION INTERNAL FIXATION (ORIF) RIGHT DISTAL FEMUR  PERIPROSTHETIC FRACTURE (Right)   Up with therapy Discharge to SNF  Follow up in 2 weeks at [facility]. Follow up with OLIN,Lashaun Poch D in 2 weeks.  Contact information:  [facility] [address] [place] Amanda Berry   PAC  04/15/2016, 8:28 AM

## 2016-04-15 NOTE — Discharge Summary (Signed)
Physician Discharge Summary  Patient ID: Amanda Berry MRN: [id number] DOB/AGE: 1933/10/23 82 y.o.  Admit date: 04/11/2016 Discharge date:  04/15/2016  Procedures:  Procedure(s) (LRB): OPEN REDUCTION INTERNAL FIXATION (ORIF) RIGHT DISTAL FEMUR  PERIPROSTHETIC FRACTURE (Right)  Attending Physician:  Dr. Paralee Cancel   Admission Diagnoses:   Right knee pain  Discharge Diagnoses:  Active Problems:   Femur fracture, right, closed, initial encounter  Past Medical History:  Diagnosis Date  . Hypertension     HPI:    82 yo patient of Dr Adriana Mccallum who is s/p TKR who fell at church and injured her right knee.  She was unable to stand after injury.  Transported by EMS to [facility] ED.  PCP: No PCP Per Patient   Discharged Condition: good  Hospital Course:  Patient was admitted to the hospital on  04/11/2016,and had an uneventful stay until surgery.   Patient underwent the above stated procedure on 04/12/2016.  Patient tolerated the procedure well and brought to the recovery room in good condition and subsequently to the floor.  POD #1 BP: 123/51 ; Pulse: 84 ; Temp: 98.1 F (36.7 C) ; Resp: 18 Patient reports pain as moderate, pain controlled. No events throughout the night.  Eepressed some concern with having to be NWB for 2 months.  Planning on SNF upon discharge. Dorsiflexion/plantar flexion intact, incision: dressing C/D/I, no cellulitis present and compartment soft.   LABS  Basename    HGB     8.0  HCT     23.5   POD #2  BP: 114/57 ; Pulse: 94 ; Temp: 98.3 F (36.8 C) ; Resp: 16 Patient reports pain as mild, pain controlled. No events throughout the night. Feels that she did well with PT. Understands the need for the restriction, but also feels that they are not going to make her life easy for a couple of months.  Discussed SNF upon discharge. Dorsiflexion/plantar flexion intact, incision: dressing C/D/I, no cellulitis present and compartment soft.   LABS  Basename    HGB     7.3    HCT     21.4   POD #3  BP: 117/53 ; Pulse: 99 ; Temp: 99.2 F (37.3 C) ; Resp: 18 Patient reports pain as mild, pain controlled.  No events throughout the night.  Patient denies any symptoms of weakness, dizziness of fatigue.  She does state that she is a little nervous with not being able to bare weight on the left leg until this is healed.  However she does know that this is necessary for the short term to allow the leg to heal. Dorsiflexion/plantar flexion intact, incision: dressing C/D/I, no cellulitis present and compartment soft.   LABS  Basename    HGB     7.1  HCT     21.2    Discharge Exam: General appearance: alert, cooperative and no distress Extremities: Homans sign is negative, no sign of DVT, no edema, redness or tenderness in the calves or thighs and no ulcers, gangrene or trophic changes  Disposition:  Skilled nursing facility with follow up in 2 weeks   Follow-up Information    Mauri Pole, MD. Schedule an appointment as soon as possible for a visit in 2 week(s).   Specialty:  Orthopedic Surgery Contact information: [address] [place] [address] [id number]           Discharge Instructions    Call MD / Call 911    Complete by:  As directed   If you experience chest pain or shortness of breath, CALL 911 and be transported to the hospital emergency room.  If you develope a fever above 101 F, pus (white drainage) or increased drainage or redness at the wound, or calf pain, call your surgeon's office.   Change dressing    Complete by:  As directed   Maintain surgical dressing until follow up in the clinic. If the edges start to pull up, may reinforce with tape. If the dressing is no longer working, may remove and cover with gauze and tape, but must keep the area dry and clean.  Call with any questions or concerns.   Constipation Prevention    Complete by:  As directed   Drink plenty of fluids.  Prune juice may be helpful.  You may use  a stool softener, such as Colace (over the counter) 100 mg twice a day.  Use MiraLax (over the counter) for constipation as needed.   Diet - low sodium heart healthy    Complete by:  As directed   Discharge instructions    Complete by:  As directed   Maintain surgical dressing until follow up in the clinic. If the edges start to pull up, may reinforce with tape. If the dressing is no longer working, may remove and cover with gauze and tape, but must keep the area dry and clean.  Follow up in 2 weeks at [facility]. Call with any questions or concerns.   Non weight bearing    Complete by:  As directed   Use knee immobilizer when out of bed / sleeping to allow the knee/ femur to heal.   Laterality:  left   Extremity:  Lower        Medication List    STOP taking these medications   ibuprofen 200 MG tablet Commonly known as:  ADVIL,MOTRIN     TAKE these medications   aspirin 325 MG EC tablet Take 1 tablet (325 mg total) by mouth 2 (two) times daily. Take for 4 weeks.   docusate sodium 100 MG capsule Commonly known as:  COLACE Take 1 capsule (100 mg total) by mouth 2 (two) times daily.   ferrous sulfate 325 (65 FE) MG tablet Take 1 tablet (325 mg total) by mouth 3 (three) times daily after meals.   HYDROcodone-acetaminophen 5-325 MG tablet Commonly known as:  NORCO/VICODIN Take 1-2 tablets by mouth every 4 (four) hours as needed for moderate pain.   lisinopril 20 MG tablet Commonly known as:  PRINIVIL,ZESTRIL Take 20 mg by mouth daily.   oxybutynin 5 MG tablet Commonly known as:  DITROPAN Take 5 mg by mouth at bedtime.   polyethylene glycol packet Commonly known as:  MIRALAX / GLYCOLAX Take 17 g by mouth 2 (two) times daily.   ranitidine 300 MG tablet Commonly known as:  ZANTAC Take 300 mg by mouth at bedtime.   RED YEAST RICE PO Take 1 tablet by mouth daily.   tiZANidine 4 MG tablet Commonly known as:  ZANAFLEX Take 1 tablet (4 mg total) by mouth every 6  (six) hours as needed for muscle spasms.        Signed: [place] Pugh. Demetrus Pavao   PA-C  04/15/2016, 9:12 AM

## 2016-04-16 ENCOUNTER — Non-Acute Institutional Stay (SKILLED_NURSING_FACILITY): Payer: PPO | Admitting: Internal Medicine

## 2016-04-16 ENCOUNTER — Encounter: Payer: Self-pay | Admitting: Internal Medicine

## 2016-04-16 DIAGNOSIS — R2681 Unsteadiness on feet: Secondary | ICD-10-CM | POA: Diagnosis not present

## 2016-04-16 DIAGNOSIS — K5900 Constipation, unspecified: Secondary | ICD-10-CM

## 2016-04-16 DIAGNOSIS — S7291XS Unspecified fracture of right femur, sequela: Secondary | ICD-10-CM | POA: Diagnosis not present

## 2016-04-16 DIAGNOSIS — N3281 Overactive bladder: Secondary | ICD-10-CM | POA: Diagnosis not present

## 2016-04-16 DIAGNOSIS — I10 Essential (primary) hypertension: Secondary | ICD-10-CM | POA: Diagnosis not present

## 2016-04-16 DIAGNOSIS — K219 Gastro-esophageal reflux disease without esophagitis: Secondary | ICD-10-CM

## 2016-04-16 DIAGNOSIS — D62 Acute posthemorrhagic anemia: Secondary | ICD-10-CM | POA: Diagnosis not present

## 2016-04-16 NOTE — Progress Notes (Signed)
LOCATION: [place]  PCP: No PCP Per Patient   Code Status: Full Code  Goals of care: Advanced Directive information Advanced Directives 04/11/2016  Does patient have an advance directive? No  Would patient like information on creating an advanced directive? No - patient declined information       Extended Emergency Contact Information Primary Emergency Contact: Pearla Dubonnet Address: [place],  [place] Home Phone: [phone] Relation: None Secondary Emergency Contact: [place] Phone: [phone] Relation: Daughter   No Known Allergies  Chief Complaint  Patient presents with  . New Admit To SNF    New Admission     HPI:  Patient is a 82 y.o. female seen today for short term rehabilitation post hospital admission from 04/11/16-04/15/16 with right knee pain with right femur closed fracture. She underwent open reduction and internal fixation. She is seen in her room today. Her pain is under control with current pain regimen.   Review of Systems:  Constitutional: Negative for fever, chills, diaphoresis. Energy level is slowly coming back.  HENT: Negative for headache, congestion, nasal discharge. Eyes: Negative for blurred vision, double vision and discharge.  Respiratory: Negative for cough, shortness of breath and wheezing.   Cardiovascular: Negative for chest pain, palpitations, leg swelling.  Gastrointestinal: Negative for heartburn, nausea, vomiting, abdominal pain. Passing flatus. She has been constipated.  Genitourinary: Negative for dysuria and flank pain.  Musculoskeletal: Negative for back pain, fall in the facility.  Skin: Negative for itching, rash.  Neurological: Negative for dizziness. Psychiatric/Behavioral: Negative for depression   Past Medical History:  Diagnosis Date  . Hypertension    Past Surgical History:  Procedure Laterality Date  . KNEE ARTHROPLASTY Right    approx 10  years ago  . ORIF PERIPROSTHETIC FRACTURE Right 04/12/2016   Procedure: OPEN REDUCTION INTERNAL FIXATION (ORIF) RIGHT DISTAL FEMUR  PERIPROSTHETIC FRACTURE;  Surgeon: Paralee Cancel, MD;  Location: [facility];  Service: Orthopedics;  Laterality: Right;   Social History:   reports that she has never smoked. She has never used smokeless tobacco. Her alcohol and drug histories are not on file.  No family history on file.  Medications:   Medication List       Accurate as of 04/16/16  2:02 PM. Always use your most recent med list.          aspirin 325 MG EC tablet Take 1 tablet (325 mg total) by mouth 2 (two) times daily. Take for 4 weeks.   docusate sodium 100 MG capsule Commonly known as:  COLACE Take 1 capsule (100 mg total) by mouth 2 (two) times daily.   ferrous sulfate 325 (65 FE) MG tablet Take 1 tablet (325 mg total) by mouth 3 (three) times daily after meals.   HYDROcodone-acetaminophen 5-325 MG tablet Commonly known as:  NORCO/VICODIN Take 1-2 tablets by mouth every 4 (four) hours as needed for moderate pain.   lisinopril 20 MG tablet Commonly known as:  PRINIVIL,ZESTRIL Take 20 mg by mouth daily.   oxybutynin 5 MG tablet Commonly known as:  DITROPAN Take 5 mg by mouth at bedtime.   polyethylene glycol packet Commonly known as:  MIRALAX / GLYCOLAX Take 17 g by mouth 2 (two) times daily.   ranitidine 300 MG tablet Commonly known as:  ZANTAC Take 300 mg by mouth at bedtime.   RED YEAST RICE PO Take 1 tablet by mouth daily.  tiZANidine 4 MG tablet Commonly known as:  ZANAFLEX Take 1 tablet (4 mg total) by mouth every 6 (six) hours as needed for muscle spasms.       Immunizations: Immunization History  Administered Date(s) Administered  . PPD Test 04/15/2016     Physical Exam:  Vitals:   04/16/16 1359  BP: (!) 142/76  Pulse: 95  Resp: 20  Temp: 97.6 F (36.4 C)  TempSrc: Oral  SpO2: 98%  Weight: 159 lb (72.1 kg)  Height: 5' (1.524 m)   Body  mass index is 31.05 kg/m.  General- elderly female, obese, in no acute distress Head- normocephalic, atraumatic Throat- moist mucus membrane Eyes- PERRLA, EOMI, no pallor, no icterus Neck- no cervical lymphadenopathy Cardiovascular- normal s1,s2, no murmur, trace leg edema Respiratory- bilateral clear to auscultation, no wheeze, no rhonchi, no crackles, no use of accessory muscles Abdomen- bowel sounds present, soft, non tender Musculoskeletal- able to move all 4 extremities, limited right leg range of motion, right leg immobilizer in place Neurological- alert and oriented to person, place and time Skin- warm and dry Psychiatry- normal mood and affect    Labs reviewed: Basic Metabolic Panel:  Recent Labs  04/13/16 0432 04/14/16 0439 04/15/16 0420  NA 135 135 136  K 4.2 4.0 4.6  CL 105 106 105  CO2 23 25 25   GLUCOSE 150* 103* 107*  BUN 13 21* 23*  CREATININE 0.62 0.73 0.63  CALCIUM 7.9* 7.8* 7.9*   Liver Function Tests: No results for input(s): AST, ALT, ALKPHOS, BILITOT, PROT, ALBUMIN in the last 8760 hours. No results for input(s): LIPASE, AMYLASE in the last 8760 hours. No results for input(s): AMMONIA in the last 8760 hours. CBC:  Recent Labs  04/11/16 1302 04/13/16 0432 04/14/16 0439 04/15/16 0420  WBC 10.3 8.5 9.6 8.8  NEUTROABS 8.2*  --   --   --   HGB 11.8* 8.0* 7.3* 7.1*  HCT 35.6* 23.5* 21.4* 21.2*  MCV 91.5 91.1 91.1 92.2  PLT 216 137* 126* 131*   Cardiac Enzymes: No results for input(s): CKTOTAL, CKMB, CKMBINDEX, TROPONINI in the last 8760 hours. BNP: Invalid input(s): POCBNP CBG: No results for input(s): GLUCAP in the last 8760 hours.  Radiological Exams: Chest Portable 1 View  Result Date: 04/11/2016 CLINICAL DATA:  Preoperative evaluation EXAM: PORTABLE CHEST 1 VIEW COMPARISON:  09/05/2012 FINDINGS: Cardiac shadow is within normal limits. Aortic calcifications are again seen. Lungs are well-aerated without focal infiltrate or sizable  effusion. No acute bony abnormality is noted. IMPRESSION: No active disease. Electronically Signed   By: Inez Catalina M.D.   On: 04/11/2016 16:20   Dg Knee Complete 4 Views Right  Result Date: 04/11/2016 CLINICAL DATA:  Fall today. Right knee pain. Previous right knee arthroplasty. Initial encounter. EXAM: RIGHT KNEE - COMPLETE 4+ VIEW COMPARISON:  None. FINDINGS: Total knee arthroplasty seen. A comminuted fracture of the distal femur is seen just above the femoral component of the prosthesis. There is mild posterior displacement and angulation of the distal fracture fragment. No other fractures identified. No evidence of dislocation. Generalized osteopenia noted. IMPRESSION: Comminuted fracture of distal femur just above femoral component of prosthesis, with mild posterior displacement and angulation. Electronically Signed   By: Earle Gell M.D.   On: 04/11/2016 12:40   Dg C-arm 1-60 Min-no Report  Result Date: 04/12/2016 CLINICAL DATA:  Operative fixation of a right distal femur fracture. EXAM: RIGHT FEMUR 2 VIEWS; DG C-ARM 1-60 MIN-NO REPORT COMPARISON:  Yesterday. FINDINGS: Five C-arm views  of the right femur demonstrate interval intra medullary rod and screw fixation of the previously demonstrated distal femur fracture. Significantly improved position and alignment of the major fragments with minimal anterior displacement of the distal fragment. A right knee prosthesis is again demonstrated. IMPRESSION: Hardware fixation of the previously described distal femur fracture. Electronically Signed   By: Claudie Revering M.D.   On: 04/12/2016 16:38   Dg Femur, Min 2 Views Right  Result Date: 04/12/2016 CLINICAL DATA:  Operative fixation of a right distal femur fracture. EXAM: RIGHT FEMUR 2 VIEWS; DG C-ARM 1-60 MIN-NO REPORT COMPARISON:  Yesterday. FINDINGS: Five C-arm views of the right femur demonstrate interval intra medullary rod and screw fixation of the previously demonstrated distal femur fracture.  Significantly improved position and alignment of the major fragments with minimal anterior displacement of the distal fragment. A right knee prosthesis is again demonstrated. IMPRESSION: Hardware fixation of the previously described distal femur fracture. Electronically Signed   By: Claudie Revering M.D.   On: 04/12/2016 16:38    Assessment/Plan  Unsteady gait With right femoral fracture. Will have patient work with PT/OT as tolerated to regain strength and restore function.  Fall precautions are in place.  Right femur fracture S/p ORIF, has orthopedics follow up. Continue norco 5-325 mg 1-2 tab q4h prn pain and aspirin 325 mg bid for dvt prophylaxis. Continue zanaflex 4 mg q6h prn muscle spasm. Will have her work with physical therapy and occupational therapy team to help with gait training and muscle strengthening exercises.fall precautions. Skin care. Encourage to be out of bed.   Blood loss anemia Post op, monitor cbc. Continue feso4 325 mg tid.   HTN Monitor bp reading, continue lisinopril 20 mg daily  Constipation On miralax 17 g bid with colace 100 mg bid.   OAB Continue ditropan  gerd Stable on ranitidine 300 mg qd   Goals of care: short term rehabilitation   Labs/tests ordered: cbc, cmp 04/19/16  Family/ staff Communication: reviewed care plan with patient and nursing supervisor    Blanchie Serve, MD Internal Medicine [place], [place] [address] Cell Phone (Monday-Friday 8 am - 5 pm): [phone] On Call: [phone] and follow prompts after 5 pm and on weekends Office Phone: [phone] Office Fax: [phone]

## 2016-04-19 ENCOUNTER — Observation Stay (HOSPITAL_COMMUNITY)
Admission: EM | Admit: 2016-04-19 | Discharge: 2016-04-20 | Disposition: A | Discharge status: Home / Self Care | Payer: PPO | Attending: Internal Medicine | Admitting: Internal Medicine

## 2016-04-19 ENCOUNTER — Encounter (HOSPITAL_COMMUNITY): Payer: Self-pay | Admitting: Internal Medicine

## 2016-04-19 DIAGNOSIS — Z7982 Long term (current) use of aspirin: Secondary | ICD-10-CM | POA: Insufficient documentation

## 2016-04-19 DIAGNOSIS — D649 Anemia, unspecified: Principal | ICD-10-CM | POA: Diagnosis present

## 2016-04-19 DIAGNOSIS — K5900 Constipation, unspecified: Secondary | ICD-10-CM | POA: Diagnosis present

## 2016-04-19 DIAGNOSIS — Z96651 Presence of right artificial knee joint: Secondary | ICD-10-CM | POA: Diagnosis not present

## 2016-04-19 DIAGNOSIS — I10 Essential (primary) hypertension: Secondary | ICD-10-CM | POA: Diagnosis not present

## 2016-04-19 DIAGNOSIS — S7291XA Unspecified fracture of right femur, initial encounter for closed fracture: Secondary | ICD-10-CM | POA: Diagnosis present

## 2016-04-19 DIAGNOSIS — N3281 Overactive bladder: Secondary | ICD-10-CM | POA: Diagnosis present

## 2016-04-19 DIAGNOSIS — Z79899 Other long term (current) drug therapy: Secondary | ICD-10-CM | POA: Diagnosis not present

## 2016-04-19 DIAGNOSIS — S728X9A Other fracture of unspecified femur, initial encounter for closed fracture: Secondary | ICD-10-CM | POA: Diagnosis not present

## 2016-04-19 DIAGNOSIS — R05 Cough: Secondary | ICD-10-CM | POA: Diagnosis not present

## 2016-04-19 LAB — BASIC METABOLIC PANEL
BUN: 16 mg/dL (ref 4–21)
CREATININE: 0.6 mg/dL (ref 0.5–1.1)
GLUCOSE: 95 mg/dL
POTASSIUM: 4.3 mmol/L (ref 3.4–5.3)
Sodium: 138 mmol/L (ref 137–147)

## 2016-04-19 LAB — HEPATIC FUNCTION PANEL
ALT: 41 U/L — AB (ref 7–35)
AST: 39 U/L — AB (ref 13–35)
Alkaline Phosphatase: 145 U/L — AB (ref 25–125)
Bilirubin, Total: 0.8 mg/dL

## 2016-04-19 LAB — ABO/RH: ABO/RH(D): O NEG

## 2016-04-19 LAB — PREPARE RBC (CROSSMATCH)

## 2016-04-19 MED ORDER — SODIUM CHLORIDE 0.9 % IV SOLN: 10.0000 mL/h | Freq: Once | INTRAVENOUS | Status: DC

## 2016-04-19 NOTE — ED Notes (Signed)
Called blood bank to verify that RN could still administer Blood after 35 minutes of pickup.  Blood bank verified RN has 4 hours to administer

## 2016-04-19 NOTE — ED Notes (Signed)
Picked up 2units of RBC's

## 2016-04-19 NOTE — ED Notes (Signed)
[place] @[phone] 

## 2016-04-19 NOTE — ED Triage Notes (Signed)
Per [facility] patient from [facility] for anemia, reported hemoglobin 6.6.  Patient denies any other complaints.  Patient has right femur in brace due to femur fracture on 04/11/16.  Patient alert and oriented at this time.

## 2016-04-19 NOTE — H&P (Signed)
History and Physical    Amanda Berry [id number] DOB: 27-Nov-1933 DOA: 04/19/2016  PCP: Shirline Frees, MD   Patient coming from: [place]  Chief Complaint: Anemia  HPI: Amanda Berry is a 82 y.o. female with medical history significant of hypertension, iron deficiency anemia who underwent ORIF of Right distal femur periprosthetic fracture 7 days ago and is coming today from rehab facility due to a hemoglobin level of 6.6 gr/dL. She denies abdominal pain, melena, hematochezia, CP, dyspnea, palpitations, dizziness, diaphoresis, PND, orthopnea, but has some pitting edema of the lower extremities.  ED Course: Type and screen was done. BMP and CBC are still pending. The patient was ordered 2 PRBCs transfusion. She denies significant pain or any other symptoms at this time.   Review of Systems: As per HPI otherwise 10 point review of systems negative.    Past Medical History:  Diagnosis Date  . Hypertension     Past Surgical History:  Procedure Laterality Date  . KNEE ARTHROPLASTY Right    approx 10 years ago  . ORIF PERIPROSTHETIC FRACTURE Right 04/12/2016   Procedure: OPEN REDUCTION INTERNAL FIXATION (ORIF) RIGHT DISTAL FEMUR  PERIPROSTHETIC FRACTURE;  Surgeon: Paralee Cancel, MD;  Location: [facility];  Service: Orthopedics;  Laterality: Right;  . ROTATOR CUFF REPAIR Right 1990's     reports that she has never smoked. She has never used smokeless tobacco. She reports that she does not drink alcohol or use drugs.  No Known Allergies  Family History  Problem Relation Age of Onset  . Hypertension Mother   . Colon cancer Father   . Valvular heart disease Sister   . Kidney disease Brother   . Alcoholism Brother   . Lung cancer Sister      Prior to Admission medications   Medication Sig Start Date End Date Taking? Authorizing Provider  aspirin EC 325 MG EC tablet Take 1 tablet (325 mg total) by mouth 2 (two) times daily. Take for 4 weeks. 04/15/16 05/15/16 Yes Matthew  Babish, PA-C  docusate sodium (COLACE) 100 MG capsule Take 1 capsule (100 mg total) by mouth 2 (two) times daily. 04/15/16  Yes Danae Orleans, PA-C  ferrous sulfate 325 (65 FE) MG tablet Take 1 tablet (325 mg total) by mouth 3 (three) times daily after meals. 04/15/16  Yes Danae Orleans, PA-C  HYDROcodone-acetaminophen (NORCO/VICODIN) 5-325 MG tablet Take 1-2 tablets by mouth every 4 (four) hours as needed for moderate pain. Patient taking differently: Take 1-2 tablets by mouth every 4 (four) hours as needed (for pain).  04/15/16  Yes Matthew Babish, PA-C  lisinopril (PRINIVIL,ZESTRIL) 20 MG tablet Take 20 mg by mouth daily. 01/15/16  Yes Historical Provider, MD  oxybutynin (DITROPAN) 5 MG tablet Take 5 mg by mouth at bedtime.  03/23/16  Yes Historical Provider, MD  polyethylene glycol (MIRALAX / GLYCOLAX) packet Take 17 g by mouth 2 (two) times daily. 04/15/16  Yes Danae Orleans, PA-C  ranitidine (ZANTAC) 300 MG tablet Take 300 mg by mouth at bedtime.  02/17/16  Yes Historical Provider, MD  Red Yeast Rice Extract (RED YEAST RICE PO) Take 1 capsule by mouth every morning.    Yes Historical Provider, MD  tiZANidine (ZANAFLEX) 4 MG tablet Take 1 tablet (4 mg total) by mouth every 6 (six) hours as needed for muscle spasms. 04/15/16  Yes Danae Orleans, PA-C    Physical Exam: Vitals:   04/19/16 2045 04/19/16 2100 04/19/16 2115 04/19/16 2130  BP: 129/64 128/57 133/59 135/65  Pulse:  86 81 82 85  Resp:      Temp:      TempSrc:      SpO2: 94% 93% 95% 97%      Constitutional: NAD, calm, comfortable Vitals:   04/19/16 2045 04/19/16 2100 04/19/16 2115 04/19/16 2130  BP: 129/64 128/57 133/59 135/65  Pulse: 86 81 82 85  Resp:      Temp:      TempSrc:      SpO2: 94% 93% 95% 97%   Eyes: PERRL, lids and conjunctivae are pale. ENMT: Mucous membranes are moist. Posterior pharynx clear of any exudate or lesions. Absent dentition with upper dentures. Neck: normal, supple, no masses, no  thyromegaly Respiratory: Clear to auscultation bilaterally, no wheezing, no crackles. Normal respiratory effort. No accessory muscle use.  Cardiovascular: Regular rate and rhythm, no murmurs / rubs / gallops. 1+ extremity edema. 2+ pedal pulses. No carotid bruits.  Abdomen: BS positive. no tenderness, no masses palpated. No hepatosplenomegaly.  Musculoskeletal: B/L swelling of lower extremities, decreased ROM on right knee with soft immobilization in place.  Skin: no rashes, lesions, ulcers. No induration.  Neurologic: CN 2-12 grossly intact. Sensation intact, DTR normal. Strength 5/5 in all 4.  Psychiatric: Normal judgment and insight. Alert and oriented x 4. Normal mood.     Labs on Admission: I have personally reviewed following labs and imaging studies  CBC:  Recent Labs Lab 04/13/16 0432 04/14/16 0439 04/15/16 0420  WBC 8.5 9.6 8.8  HGB 8.0* 7.3* 7.1*  HCT 23.5* 21.4* 21.2*  MCV 91.1 91.1 92.2  PLT 137* 126* [id number]*   Basic Metabolic Panel:  Recent Labs Lab 04/13/16 0432 04/14/16 0439 04/15/16 0420  NA 135 135 136  K 4.2 4.0 4.6  CL 105 106 105  CO2 23 25 25   GLUCOSE 150* 103* 107*  BUN 13 21* 23*  CREATININE 0.62 0.73 0.63  CALCIUM 7.9* 7.8* 7.9*   GFR: Estimated Creatinine Clearance: 49.7 mL/min (by C-G formula based on SCr of 0.8 mg/dL).  Urine analysis:    Component Value Date/Time   COLORURINE YELLOW 03/01/2007 [place] 03/01/2007 1131   LABSPEC 1.016 03/01/2007 1131   PHURINE 6.5 03/01/2007 1131   GLUCOSEU NEGATIVE 03/01/2007 1131   HGBUR NEGATIVE 03/01/2007 1131   BILIRUBINUR NEGATIVE 03/01/2007 1131   KETONESUR NEGATIVE 03/01/2007 1131   PROTEINUR NEGATIVE 03/01/2007 1131   UROBILINOGEN 0.2 03/01/2007 1131   NITRITE NEGATIVE 03/01/2007 1131   LEUKOCYTESUR SMALL (A) 03/01/2007 1131    Recent Results (from the past 240 hour(s))  Surgical pcr screen     Status: None   Collection Time: 04/11/16  8:09 PM  Result Value Ref Range  Status   MRSA, PCR NEGATIVE NEGATIVE Final   Staphylococcus aureus NEGATIVE NEGATIVE Final    Comment:        The Xpert SA Assay (FDA approved for NASAL specimens in patients over 29 years of age), is one component of a comprehensive surveillance program.  Test performance has been validated by [facility] for patients greater than or equal to 60 year old. It is not intended to diagnose infection nor to guide or monitor treatment.       Assessment/Plan Principal Problem:   Symptomatic anemia Admit to medSurg. Transfuse 2 units of packed RBCs. Follow-up H&H in the morning. Continue iron supplementation.  Active Problems:   Femur fracture, right, closed, initial encounter Continue Norco as needed. Continue Zanaflex for muscle spasms as needed. Orthopedic surgery follow-ups as scheduled.  Hypertension Continue lisinopril 20 mg by mouth daily. Monitor electrolytes, BUN and creatinine. Monitor blood pressure.    Constipation Secondary to iron supplement and opioid analgesics. Continue Colace and MiraLAX.     Overactive bladder Continue oxybutynin 5 mg po at bedtime.    DVT prophylaxis: Lovenox subcutaneous. Code Status: Full code. Family Communication: Her daughter Almyra Free was present in the room Disposition Plan: Admit overnight for blood transfusion. Consults called: Orthopedic surgery was notified by EGD of the patient's presence in the hospital. Admission status: Observation/MedSurg.   Reubin Milan MD [facility] Pager [phone].  If 7PM-7AM, please contact night-coverage [email or web address] Password [facility]  04/19/2016, 10:15 PM

## 2016-04-20 DIAGNOSIS — I10 Essential (primary) hypertension: Secondary | ICD-10-CM

## 2016-04-20 DIAGNOSIS — S7291XA Unspecified fracture of right femur, initial encounter for closed fracture: Secondary | ICD-10-CM

## 2016-04-20 DIAGNOSIS — D62 Acute posthemorrhagic anemia: Secondary | ICD-10-CM | POA: Diagnosis not present

## 2016-04-20 DIAGNOSIS — N3281 Overactive bladder: Secondary | ICD-10-CM

## 2016-04-20 DIAGNOSIS — M6281 Muscle weakness (generalized): Secondary | ICD-10-CM | POA: Diagnosis not present

## 2016-04-20 DIAGNOSIS — R262 Difficulty in walking, not elsewhere classified: Secondary | ICD-10-CM | POA: Diagnosis not present

## 2016-04-20 DIAGNOSIS — D649 Anemia, unspecified: Secondary | ICD-10-CM

## 2016-04-20 DIAGNOSIS — Z4789 Encounter for other orthopedic aftercare: Secondary | ICD-10-CM | POA: Diagnosis not present

## 2016-04-20 DIAGNOSIS — T84124D Displacement of internal fixation device of right femur, subsequent encounter: Secondary | ICD-10-CM | POA: Diagnosis not present

## 2016-04-20 DIAGNOSIS — M979XXD Periprosthetic fracture around unspecified internal prosthetic joint, subsequent encounter: Secondary | ICD-10-CM | POA: Diagnosis not present

## 2016-04-20 LAB — CBC
HEMATOCRIT: 31 % — AB (ref 36.0–46.0)
Hemoglobin: 10 g/dL — ABNORMAL LOW (ref 12.0–15.0)
MCH: 30.5 pg (ref 26.0–34.0)
MCHC: 32.3 g/dL (ref 30.0–36.0)
MCV: 94.5 fL (ref 78.0–100.0)
PLATELETS: 198 10*3/uL (ref 150–400)
RBC: 3.28 MIL/uL — ABNORMAL LOW (ref 3.87–5.11)
RDW: 16 % — AB (ref 11.5–15.5)
WBC: 9.1 10*3/uL (ref 4.0–10.5)

## 2016-04-20 MED ORDER — LISINOPRIL 20 MG PO TABS
20.0000 mg | ORAL_TABLET | Freq: Every day | ORAL | Status: DC
  Administered 2016-04-20: 20 mg via ORAL
  Filled 2016-04-20: qty 1

## 2016-04-20 MED ORDER — POLYETHYLENE GLYCOL 3350 17 G PO PACK
17.0000 g | PACK | Freq: Two times a day (BID) | ORAL | Status: DC
  Administered 2016-04-20: 17 g via ORAL
  Filled 2016-04-20 (×2): qty 1

## 2016-04-20 MED ORDER — OXYBUTYNIN CHLORIDE 5 MG PO TABS
5.0000 mg | ORAL_TABLET | Freq: Every day | ORAL | Status: DC
  Administered 2016-04-20: 5 mg via ORAL
  Filled 2016-04-20: qty 1

## 2016-04-20 MED ORDER — HEPARIN SODIUM (PORCINE) 5000 UNIT/ML IJ SOLN
5000.0000 [IU] | Freq: Three times a day (TID) | INTRAMUSCULAR | Status: DC
  Administered 2016-04-20: 5000 [IU] via SUBCUTANEOUS
  Filled 2016-04-20 (×2): qty 1

## 2016-04-20 MED ORDER — HYDROCODONE-ACETAMINOPHEN 5-325 MG PO TABS: 1.0000 | ORAL_TABLET | ORAL | 0 refills | Status: DC | PRN

## 2016-04-20 MED ORDER — TIZANIDINE HCL 2 MG PO TABS: 4.0000 mg | ORAL_TABLET | Freq: Four times a day (QID) | ORAL | Status: DC | PRN

## 2016-04-20 MED ORDER — FAMOTIDINE 20 MG PO TABS
20.0000 mg | ORAL_TABLET | Freq: Two times a day (BID) | ORAL | Status: DC
Start: 2016-04-20 — End: 2016-04-21
  Administered 2016-04-20 (×2): 20 mg via ORAL
  Filled 2016-04-20 (×2): qty 1

## 2016-04-20 MED ORDER — ASPIRIN EC 325 MG PO TBEC
325.0000 mg | DELAYED_RELEASE_TABLET | Freq: Two times a day (BID) | ORAL | Status: DC
  Administered 2016-04-20: 325 mg via ORAL
  Filled 2016-04-20: qty 1

## 2016-04-20 MED ORDER — ONDANSETRON HCL 4 MG PO TABS: 4.0000 mg | ORAL_TABLET | Freq: Four times a day (QID) | ORAL | Status: DC | PRN

## 2016-04-20 MED ORDER — FERROUS SULFATE 325 (65 FE) MG PO TABS
325.0000 mg | ORAL_TABLET | Freq: Three times a day (TID) | ORAL | Status: DC
  Administered 2016-04-20 (×2): 325 mg via ORAL
  Filled 2016-04-20 (×2): qty 1

## 2016-04-20 MED ORDER — DOCUSATE SODIUM 100 MG PO CAPS
100.0000 mg | ORAL_CAPSULE | Freq: Two times a day (BID) | ORAL | Status: DC
  Administered 2016-04-20 (×2): 100 mg via ORAL
  Filled 2016-04-20 (×2): qty 1

## 2016-04-20 MED ORDER — HYDROCODONE-ACETAMINOPHEN 5-325 MG PO TABS
1.0000 | ORAL_TABLET | ORAL | Status: DC | PRN
  Administered 2016-04-20: 1 via ORAL
  Filled 2016-04-20: qty 1

## 2016-04-20 MED ORDER — ONDANSETRON HCL 4 MG/2ML IJ SOLN: 4.0000 mg | Freq: Four times a day (QID) | INTRAMUSCULAR | Status: DC | PRN

## 2016-04-20 NOTE — NC FL2 (Signed)
[place] LEVEL OF CARE SCREENING TOOL     IDENTIFICATION  Patient Name: Amanda Berry Birthdate: 01/02/1934 Sex: female Admission Date (Current Location): 04/19/2016  [facility] and [place] Number:  Herbalist and Address:  [facility] [place], [place] [address], [place], [place] [address]      Provider Number: [id number]  Attending Physician Name and Address:  Verlee Monte, MD  Relative Name and Phone Number:  Alcide Evener - daughter.  Phone [phone]    Current Level of Care: Hospital Recommended Level of Care: [place] Prior Approval Number:    Date Approved/Denied:   PASRR Number:   [id number]   Discharge Plan:  [place]    Current Diagnoses: Patient Active Problem List   Diagnosis Date Noted  . Symptomatic anemia 04/19/2016  . Hypertension 04/19/2016  . Constipation 04/19/2016  . Overactive bladder 04/19/2016  . Femur fracture, right, closed, initial encounter 04/11/2016    Orientation RESPIRATION BLADDER Height & Weight     Self, Time, Situation, Place  Normal Continent Weight: 177 lb 4 oz (80.4 kg) Height:  5' (152.4 cm)  BEHAVIORAL SYMPTOMS/MOOD NEUROLOGICAL BOWEL NUTRITION STATUS      Continent Diet (Low sodium - Heart healthy)  AMBULATORY STATUS COMMUNICATION OF NEEDS Skin   Extensive Assist (Patient unable to ambulate with PT during evaluation) Verbally Other (Comment) (Ecchymosis right and left arm)                       Personal Care Assistance Level of Assistance  Bathing, Feeding, Dressing Bathing Assistance: Maximum assistance Feeding assistance: Independent Dressing Assistance: Maximum assistance     Functional Limitations Info  Sight, Hearing, Speech Sight Info: Adequate Hearing Info: Adequate Speech Info: Adequate    SPECIAL CARE FACTORS FREQUENCY  PT (By licensed PT)     PT Frequency: Evaluated 04/20/16              Contractures Contractures Info: Not present     Additional Factors Info  Code Status Code Status Info: Full             Current Medications (04/20/2016):  This is the current hospital active medication list Current Facility-Administered Medications  Medication Dose Route Frequency Provider Last Rate Last Dose  . 0.9 %  sodium chloride infusion  10 mL/hr Intravenous Once Leonard Schwartz, MD      . aspirin EC tablet 325 mg  325 mg Oral BID WC Reubin Milan, MD   325 mg at 04/20/16 0817  . docusate sodium (COLACE) capsule 100 mg  100 mg Oral BID Reubin Milan, MD   100 mg at 04/20/16 1123  . famotidine (PEPCID) tablet 20 mg  20 mg Oral BID Reubin Milan, MD   20 mg at 04/20/16 1124  . ferrous sulfate tablet 325 mg  325 mg Oral TID PC Reubin Milan, MD   325 mg at 04/20/16 1132  . heparin injection 5,000 Units  5,000 Units Subcutaneous Q8H Reubin Milan, MD   5,000 Units at 04/20/16 1431  . HYDROcodone-acetaminophen (NORCO/VICODIN) 5-325 MG per tablet 1-2 tablet  1-2 tablet Oral Q4H PRN Reubin Milan, MD      . lisinopril (PRINIVIL,ZESTRIL) tablet 20 mg  20 mg Oral Daily Reubin Milan, MD   20 mg at 04/20/16 1124  . ondansetron (ZOFRAN) tablet 4 mg  4 mg Oral Q6H PRN Reubin Milan, MD  Or  . ondansetron (ZOFRAN) injection 4 mg  4 mg Intravenous Q6H PRN Reubin Milan, MD      . oxybutynin [facility]) tablet 5 mg  5 mg Oral QHS Reubin Milan, MD      . polyethylene glycol [facility] Floria Raveling) packet 17 g  17 g Oral BID Reubin Milan, MD   17 g at 04/20/16 1123  . tiZANidine (ZANAFLEX) tablet 4 mg  4 mg Oral Q6H PRN Reubin Milan, MD         Discharge Medications: Please see discharge summary for a list of discharge medications.  Relevant Imaging Results:  Relevant Lab Results:   Additional Information [id number]  Sable Feil, LCSW

## 2016-04-20 NOTE — Discharge Summary (Signed)
Physician Discharge Summary  Amanda Berry [id number] DOB: 01/04/1934 DOA: 04/19/2016  PCP: Shirline Frees, MD  Admit date: 04/19/2016 Discharge date: 04/20/2016  Admitted From: [facility] Disposition: Going back to [facility]  Recommendations for Outpatient Follow-up:  1. Follow up with Dr. Alvan Dame in 1 week 2. Please obtain BMP/CBC in one week   Home Health: N/A Equipment/Devices: N/A  Discharge Condition: Stable CODE STATUS: Full code Diet recommendation: Heart Healthy  Brief/Interim Summary: Amanda Berry is a 82 y.o. female with medical history significant of hypertension, iron deficiency anemia who underwent ORIF of Right distal femur periprosthetic fracture 7 days ago and is coming today from rehab facility due to a hemoglobin level of 6.6 gr/dL. She denies abdominal pain, melena, hematochezia, CP, dyspnea, palpitations, dizziness, diaphoresis, PND, orthopnea, but has some pitting edema of the lower extremities.  Discharge Diagnoses:  Principal Problem:   Symptomatic anemia Active Problems:   Femur fracture, right, closed, initial encounter   Hypertension   Constipation   Overactive bladder     Symptomatic anemia This is postoperative acute blood loss anemia secondary to the recent femoral fracture ORIF. Discharge with hemoglobin of 7.1, reportedly hemoglobin was 6.6 yesterday. Status post transfusion of 2 units of packed RBCs, hemoglobin appropriately rose to 10.0. No history of anemia, continue iron supplementation. I have spoke with Dr. Alvan Dame Of orthopedics and he recommended return to the nursing home, no further workup. Patient will follow-up with him and his office within one week.    Femur fracture, right, closed, initial encounter Continue Norco as needed. Going back to her SNF to complete her short-term rehabilitation Continue Zanaflex for muscle spasms as needed. Orthopedic surgery follow-ups as scheduled.    Hypertension Continue  lisinopril 20 mg by mouth daily. Monitor electrolytes, BUN and creatinine. Monitor blood pressure.    Constipation Secondary to iron supplement and opioid analgesics. Continue Colace and MiraLAX.     Overactive bladder Continue oxybutynin 5 mg po at bedtime.   Discharge Instructions  Discharge Instructions    Diet - low sodium heart healthy    Complete by:  As directed   Increase activity slowly    Complete by:  As directed       Medication List    TAKE these medications   aspirin 325 MG EC tablet Take 1 tablet (325 mg total) by mouth 2 (two) times daily. Take for 4 weeks.   docusate sodium 100 MG capsule Commonly known as:  COLACE Take 1 capsule (100 mg total) by mouth 2 (two) times daily.   ferrous sulfate 325 (65 FE) MG tablet Take 1 tablet (325 mg total) by mouth 3 (three) times daily after meals.   HYDROcodone-acetaminophen 5-325 MG tablet Commonly known as:  NORCO/VICODIN Take 1-2 tablets by mouth every 4 (four) hours as needed (for pain).   lisinopril 20 MG tablet Commonly known as:  PRINIVIL,ZESTRIL Take 20 mg by mouth daily.   oxybutynin 5 MG tablet Commonly known as:  DITROPAN Take 5 mg by mouth at bedtime.   polyethylene glycol packet Commonly known as:  MIRALAX / GLYCOLAX Take 17 g by mouth 2 (two) times daily.   ranitidine 300 MG tablet Commonly known as:  ZANTAC Take 300 mg by mouth at bedtime.   RED YEAST RICE PO Take 1 capsule by mouth every morning.   tiZANidine 4 MG tablet Commonly known as:  ZANAFLEX Take 1 tablet (4 mg total) by mouth every 6 (six) hours as needed for muscle  spasms.       No Known Allergies  Consultations:  Discussed with Dr. Anna Genre over the phone prior to discharge.   Procedures/Studies: Chest Portable 1 View  Result Date: 04/11/2016 CLINICAL DATA:  Preoperative evaluation EXAM: PORTABLE CHEST 1 VIEW COMPARISON:  09/05/2012 FINDINGS: Cardiac shadow is within normal limits. Aortic calcifications are again  seen. Lungs are well-aerated without focal infiltrate or sizable effusion. No acute bony abnormality is noted. IMPRESSION: No active disease. Electronically Signed   By: Inez Catalina M.D.   On: 04/11/2016 16:20   Dg Knee Complete 4 Views Right  Result Date: 04/11/2016 CLINICAL DATA:  Fall today. Right knee pain. Previous right knee arthroplasty. Initial encounter. EXAM: RIGHT KNEE - COMPLETE 4+ VIEW COMPARISON:  None. FINDINGS: Total knee arthroplasty seen. A comminuted fracture of the distal femur is seen just above the femoral component of the prosthesis. There is mild posterior displacement and angulation of the distal fracture fragment. No other fractures identified. No evidence of dislocation. Generalized osteopenia noted. IMPRESSION: Comminuted fracture of distal femur just above femoral component of prosthesis, with mild posterior displacement and angulation. Electronically Signed   By: Earle Gell M.D.   On: 04/11/2016 12:40   Dg C-arm 1-60 Min-no Report  Result Date: 04/12/2016 CLINICAL DATA:  Operative fixation of a right distal femur fracture. EXAM: RIGHT FEMUR 2 VIEWS; DG C-ARM 1-60 MIN-NO REPORT COMPARISON:  Yesterday. FINDINGS: Five C-arm views of the right femur demonstrate interval intra medullary rod and screw fixation of the previously demonstrated distal femur fracture. Significantly improved position and alignment of the major fragments with minimal anterior displacement of the distal fragment. A right knee prosthesis is again demonstrated. IMPRESSION: Hardware fixation of the previously described distal femur fracture. Electronically Signed   By: Claudie Revering M.D.   On: 04/12/2016 16:38   Dg Femur, Min 2 Views Right  Result Date: 04/12/2016 CLINICAL DATA:  Operative fixation of a right distal femur fracture. EXAM: RIGHT FEMUR 2 VIEWS; DG C-ARM 1-60 MIN-NO REPORT COMPARISON:  Yesterday. FINDINGS: Five C-arm views of the right femur demonstrate interval intra medullary rod and screw  fixation of the previously demonstrated distal femur fracture. Significantly improved position and alignment of the major fragments with minimal anterior displacement of the distal fragment. A right knee prosthesis is again demonstrated. IMPRESSION: Hardware fixation of the previously described distal femur fracture. Electronically Signed   By: Claudie Revering M.D.   On: 04/12/2016 16:38    (Echo, Carotid, EGD, Colonoscopy, ERCP)    Subjective:   Discharge Exam: Vitals:   04/20/16 0410 04/20/16 0615  BP: (!) 131/38 (!) 129/44  Pulse: 94 (!) 102  Resp: 18 18  Temp: 98 F (36.7 C) 98.2 F (36.8 C)   Vitals:   04/20/16 0300 04/20/16 0355 04/20/16 0410 04/20/16 0615  BP: (!) 117/55 121/60 (!) 131/38 (!) 129/44  Pulse: (!) 111 (!) 101 94 (!) 102  Resp: 20 18 18 18   Temp: 99 F (37.2 C) 98 F (36.7 C) 98 F (36.7 C) 98.2 F (36.8 C)  TempSrc: Oral Oral Oral Oral  SpO2: 96% 98% 96% 96%  Weight:      Height:        General: Pt is alert, awake, not in acute distress Cardiovascular: RRR, S1/S2 +, no rubs, no gallops Respiratory: CTA bilaterally, no wheezing, no rhonchi Abdominal: Soft, NT, ND, bowel sounds + Extremities: no edema, no cyanosis    The results of significant diagnostics from this hospitalization (including imaging, microbiology,  ancillary and laboratory) are listed below for reference.     Microbiology: Recent Results (from the past 240 hour(s))  Surgical pcr screen     Status: None   Collection Time: 04/11/16  8:09 PM  Result Value Ref Range Status   MRSA, PCR NEGATIVE NEGATIVE Final   Staphylococcus aureus NEGATIVE NEGATIVE Final    Comment:        The Xpert SA Assay (FDA approved for NASAL specimens in patients over 38 years of age), is one component of a comprehensive surveillance program.  Test performance has been validated by [facility] for patients greater than or equal to 56 year old. It is not intended to diagnose infection nor to guide or  monitor treatment.      Labs: BNP (last 3 results) No results for input(s): BNP in the last 8760 hours. Basic Metabolic Panel:  Recent Labs Lab 04/14/16 0439 04/15/16 0420  NA 135 136  K 4.0 4.6  CL 106 105  CO2 25 25  GLUCOSE 103* 107*  BUN 21* 23*  CREATININE 0.73 0.63  CALCIUM 7.8* 7.9*   Liver Function Tests: No results for input(s): AST, ALT, ALKPHOS, BILITOT, PROT, ALBUMIN in the last 168 hours. No results for input(s): LIPASE, AMYLASE in the last 168 hours. No results for input(s): AMMONIA in the last 168 hours. CBC:  Recent Labs Lab 04/14/16 0439 04/15/16 0420 04/20/16 0921  WBC 9.6 8.8 9.1  HGB 7.3* 7.1* 10.0*  HCT 21.4* 21.2* 31.0*  MCV 91.1 92.2 94.5  PLT 126* 131* 198   Cardiac Enzymes: No results for input(s): CKTOTAL, CKMB, CKMBINDEX, TROPONINI in the last 168 hours. BNP: Invalid input(s): POCBNP CBG: No results for input(s): GLUCAP in the last 168 hours. D-Dimer No results for input(s): DDIMER in the last 72 hours. Hgb A1c No results for input(s): HGBA1C in the last 72 hours. Lipid Profile No results for input(s): CHOL, HDL, LDLCALC, TRIG, CHOLHDL, LDLDIRECT in the last 72 hours. Thyroid function studies No results for input(s): TSH, T4TOTAL, T3FREE, THYROIDAB in the last 72 hours.  Invalid input(s): FREET3 Anemia work up No results for input(s): VITAMINB12, FOLATE, FERRITIN, TIBC, IRON, RETICCTPCT in the last 72 hours. Urinalysis    Component Value Date/Time   COLORURINE YELLOW 03/01/2007 [place] 03/01/2007 1131   LABSPEC 1.016 03/01/2007 1131   PHURINE 6.5 03/01/2007 1131   GLUCOSEU NEGATIVE 03/01/2007 1131   HGBUR NEGATIVE 03/01/2007 1131   BILIRUBINUR NEGATIVE 03/01/2007 1131   KETONESUR NEGATIVE 03/01/2007 1131   PROTEINUR NEGATIVE 03/01/2007 1131   UROBILINOGEN 0.2 03/01/2007 1131   NITRITE NEGATIVE 03/01/2007 1131   LEUKOCYTESUR SMALL (A) 03/01/2007 1131   Sepsis Labs Invalid input(s): PROCALCITONIN,   WBC,  LACTICIDVEN Microbiology Recent Results (from the past 240 hour(s))  Surgical pcr screen     Status: None   Collection Time: 04/11/16  8:09 PM  Result Value Ref Range Status   MRSA, PCR NEGATIVE NEGATIVE Final   Staphylococcus aureus NEGATIVE NEGATIVE Final    Comment:        The Xpert SA Assay (FDA approved for NASAL specimens in patients over 58 years of age), is one component of a comprehensive surveillance program.  Test performance has been validated by [facility] for patients greater than or equal to 69 year old. It is not intended to diagnose infection nor to guide or monitor treatment.      Time coordinating discharge: Over 30 minutes  SIGNED:   Birdie Hopes, MD  [facility] 04/20/2016, 11:03 AM Pager   If 7PM-7AM, please contact night-coverage [email or web address] Password [email or web address]

## 2016-04-20 NOTE — Care Management Note (Signed)
Case Management Note  Patient Details  Name: Amanda Berry MRN: [id number] Date of Birth: 10-18-1933  Subjective/Objective:         CM following for progression and d/c planning.            Action/Plan: 04/20/2016 Met with pt re OBS status and d/c plans. Pt states that she is a pt at [facility] and wishes to return there to complete her rehab. This CM informed CSW, Crawford Givens. No DME or HH needs as pt will return to SNF.   Expected Discharge Date:                  Expected Discharge Plan:  Skilled Nursing Facility  In-House Referral:  Clinical Social Work  Discharge planning Services  CM Consult  Post Acute Care Choice:  NA Choice offered to:  NA  DME Arranged:   NA DME Agency:   NA  HH Arranged:   NA HH Agency:   NA  Status of Service:  Completed, signed off  If discussed at [place] of Stay Meetings, dates discussed:    Additional Comments:  Adron Bene, RN 04/20/2016, 10:56 AM

## 2016-04-20 NOTE — Progress Notes (Signed)
Amanda Berry to be D/C'd Skilled nursing facility per MD order.  Discussed prescriptions and follow up appointments with the patient. Prescriptions given to patient, medication list explained in detail. Pt verbalized understanding.    Medication List    TAKE these medications   aspirin 325 MG EC tablet Take 1 tablet (325 mg total) by mouth 2 (two) times daily. Take for 4 weeks.   docusate sodium 100 MG capsule Commonly known as:  COLACE Take 1 capsule (100 mg total) by mouth 2 (two) times daily.   ferrous sulfate 325 (65 FE) MG tablet Take 1 tablet (325 mg total) by mouth 3 (three) times daily after meals.   HYDROcodone-acetaminophen 5-325 MG tablet Commonly known as:  NORCO/VICODIN Take 1-2 tablets by mouth every 4 (four) hours as needed (for pain).   lisinopril 20 MG tablet Commonly known as:  PRINIVIL,ZESTRIL Take 20 mg by mouth daily.   oxybutynin 5 MG tablet Commonly known as:  DITROPAN Take 5 mg by mouth at bedtime.   polyethylene glycol packet Commonly known as:  MIRALAX / GLYCOLAX Take 17 g by mouth 2 (two) times daily.   ranitidine 300 MG tablet Commonly known as:  ZANTAC Take 300 mg by mouth at bedtime.   RED YEAST RICE PO Take 1 capsule by mouth every morning.   tiZANidine 4 MG tablet Commonly known as:  ZANAFLEX Take 1 tablet (4 mg total) by mouth every 6 (six) hours as needed for muscle spasms.       Vitals:   04/20/16 1123 04/20/16 1734  BP: 123/69 (!) 124/51  Pulse:  87  Resp:  18  Temp:  98.9 F (37.2 C)    Skin clean, dry and intact without evidence of skin break down, no evidence of skin tears noted. IV catheter discontinued intact. Site without signs and symptoms of complications. Dressing and pressure applied. Pt denies pain at this time. No complaints noted.  An After Visit Summary was printed and given to the patient. Patient escorted via stretcher, and D/C home via ambulance.  Retta Mac BSN, RN

## 2016-04-20 NOTE — Care Management Obs Status (Signed)
[place] NOTIFICATION   Patient Details  Name: Amanda Berry MRN: [id number] Date of Birth: 06-07-1934   Medicare Observation Status Notification Given:  Yes    Adylynn Hertenstein, Rory Percy, RN 04/20/2016, 10:54 AM

## 2016-04-20 NOTE — Evaluation (Signed)
Physical Therapy Evaluation Patient Details Name: Amanda Berry MRN: [id number] DOB: Nov 21, 1933 Today's Date: 04/20/2016   History of Present Illness  Pt adm from SNF for symptomatic anemia. Pt with recent hospitalization for rt periprosthetic femur fx with ORIF.   Clinical Impression  Pt admitted with above diagnosis and presents to PT with functional limitations due to deficits listed below (See PT problem list). Pt needs skilled PT to maximize independence and safety to allow discharge to back to [facility]. Pt continues to have significant limitations with mobility due to restrictions from femur fx. Continues to require skilled care at [facility].     Follow Up Recommendations SNF    Equipment Recommendations  None recommended by PT    Recommendations for Other Services       Precautions / Restrictions Precautions Precautions: Fall Precaution Comments: No ROM for 2 weeks per op note Required Braces or Orthoses: Knee Immobilizer - Right Knee Immobilizer - Right: On at all times Restrictions Weight Bearing Restrictions: Yes RLE Weight Bearing: Non weight bearing      Mobility  Bed Mobility Overal bed mobility: Needs Assistance Bed Mobility: Rolling;Supine to Sit;Sit to Supine Rolling: Min guard   Supine to sit: Min assist;HOB elevated Sit to supine: Min assist   General bed mobility comments: Assist to bring RLE off of bed and to elevate trunk into sitting. Assist to bring RLE back up into bed when returning to supine. Pt using rails for rolling  Transfers Overall transfer level: Needs assistance Equipment used: Rolling walker (2 wheeled) Transfers: Sit to/from Stand Sit to Stand: Mod assist         General transfer comment: Assist to raise hips into standing. Verbal cues for hand placement  Ambulation/Gait                Stairs            Wheelchair Mobility    Modified Rankin (Stroke Patients Only)       Balance Overall balance assessment: Needs  assistance Sitting-balance support: No upper extremity supported;Feet supported Sitting balance-Leahy Scale: Good     Standing balance support: Bilateral upper extremity supported Standing balance-Leahy Scale: Poor Standing balance comment: Walker and min A for static standing                             Pertinent Vitals/Pain Pain Assessment: Faces Faces Pain Scale: Hurts a little bit Pain Location: RLE Pain Descriptors / Indicators: Grimacing Pain Intervention(s): Limited activity within patient's tolerance;Monitored during session;Repositioned    Home Living Family/patient expects to be discharged to:: Skilled nursing facility                      Prior Function Level of Independence: Independent         Comments: Prior to femur fx. Since then assist for bed mobility and transfers and unable to amb     Hand Dominance        Extremity/Trunk Assessment   Upper Extremity Assessment: Overall WFL for tasks assessed           Lower Extremity Assessment: RLE deficits/detail RLE Deficits / Details: maintained KI, pt unable to perform SLR, able to perform ankle pumps       Communication   Communication: No difficulties  Cognition Arousal/Alertness: Awake/alert Behavior During Therapy: WFL for tasks assessed/performed Overall Cognitive Status: Within Functional Limits for tasks assessed  General Comments      Exercises        Assessment/Plan    PT Assessment All further PT needs can be met in the next venue of care  PT Diagnosis Difficulty walking;Generalized weakness   PT Problem List Decreased strength;Decreased balance;Decreased mobility;Decreased knowledge of use of DME;Decreased knowledge of precautions  PT Treatment Interventions     PT Goals (Current goals can be found in the Care Plan section) Acute Rehab PT Goals PT Goal Formulation:  (Pt returning to SNF today)    Frequency     Barriers to  discharge        Co-evaluation               End of Session Equipment Utilized During Treatment: Gait belt;Right knee immobilizer Activity Tolerance: Patient tolerated treatment well Patient left: in bed;with call bell/phone within reach      Functional Assessment Tool Used: clinical judgement Functional Limitation: Mobility: Walking and moving around Mobility: Walking and Moving Around Current Status (G8978): At least 60 percent but less than 80 percent impaired, limited or restricted Mobility: Walking and Moving Around Goal Status (G8979): At least 60 percent but less than 80 percent impaired, limited or restricted Mobility: Walking and Moving Around Discharge Status (G8980): At least 60 percent but less than 80 percent impaired, limited or restricted    Time: 1441-1500 PT Time Calculation (min) (ACUTE ONLY): 19 min   Charges:   PT Evaluation $PT Eval Moderate Complexity: 1 Procedure     PT G Codes:   PT G-Codes **NOT FOR INPATIENT CLASS** Functional Assessment Tool Used: clinical judgement Functional Limitation: Mobility: Walking and moving around Mobility: Walking and Moving Around Current Status (G8978): At least 60 percent but less than 80 percent impaired, limited or restricted Mobility: Walking and Moving Around Goal Status (G8979): At least 60 percent but less than 80 percent impaired, limited or restricted Mobility: Walking and Moving Around Discharge Status (G8980): At least 60 percent but less than 80 percent impaired, limited or restricted    , 04/20/2016, 3:22 PM   PT [phone]   

## 2016-04-20 NOTE — Clinical Social Work Note (Signed)
Patient will discharge back to [facility] today to continue her rehab.  Discharge clinicals transmitted to facility and patient will be transported by ambulance. Daughter, Alcide Evener contacted and informed of discharge and ambulance transport.  Mrs. Braccio contacted her husband to inform him of today's discharge.  Analena Gama Givens, MSW, LCSW Licensed Clinical Social Worker [place] [phone]

## 2016-04-20 NOTE — Clinical Social Work Note (Signed)
Clinical Social Work Assessment  Patient Details  Name: Amanda Berry MRN: [id number] Date of Birth: 08/12/1934  Date of referral:  04/20/16               Reason for consult:  Facility Placement (Patient from [facility])                Permission sought to share information with:  Family Supports Permission granted to share information::  Yes, Verbal Permission Granted  Name::     Alcide Evener  Agency::     Relationship::  Daughter  Contact Information:  [phone]  Housing/Transportation Living arrangements for the past 2 months:  [place] [employer]) Source of Information:  Patient, Other (Comment Required), Adult Children (Daughter Almyra Free) Patient Interpreter Needed:  None Criminal Activity/Legal Involvement Pertinent to Current Situation/Hospitalization:  No - Comment as needed Significant Relationships:  Adult Children, Spouse Lives with:  Facility Resident (At [facility] for [place] rehab) Do you feel safe going back to the place where you live?  Yes Need for family participation in patient care:  Yes (Comment)  Care giving concerns:  Patient and daughter in agreement with return to [facility] to continue rehab.   Social Worker assessment / plan:  CSW talked with patient at bedside regarding discharge planning and to confirm return to [facility]. Mrs. Yelinek was lying in bed and was alert, oriented, pleasant and open to talking with CSW regarding her discharge plan. CSW given permission to contact her daughter and patient indicated that she would contact her husband Jamse Belfast.  Daughter contacted and she is agreeable to patient returning to [facility]. Daughter and patient informed that insurance authorization would be initiated and they would be advised once authorization received.   Employment status:  Retired Forensic scientist:  Engineer, technical sales) PT Recommendations:  [place] / Referral to  community resources:  [place]  Patient/Family's Response to care:  No concerns expressed regarding care during hospitalization.  Patient/Family's Understanding of and Emotional Response to Diagnosis, Current Treatment, and Prognosis:  Not discussed.  Emotional Assessment Appearance:  Appears stated age Attitude/Demeanor/Rapport:  Other (Appropriate) Affect (typically observed):  Pleasant, Appropriate Orientation:  Oriented to Self, Oriented to Place, Oriented to  Time, Oriented to Situation Alcohol / Substance use:  Never Used Psych involvement (Current and /or in the community):  No (Comment)  Discharge Needs  Concerns to be addressed:  Discharge Planning Concerns Readmission within the last 30 days:  Yes Current discharge risk:  None Barriers to Discharge:  No Barriers Identified   Sable Feil, LCSW 04/20/2016, 4:20 PM

## 2016-04-21 ENCOUNTER — Non-Acute Institutional Stay (SKILLED_NURSING_FACILITY): Payer: PPO | Admitting: Adult Health

## 2016-04-21 ENCOUNTER — Encounter: Payer: Self-pay | Admitting: Adult Health

## 2016-04-21 DIAGNOSIS — I10 Essential (primary) hypertension: Secondary | ICD-10-CM | POA: Diagnosis not present

## 2016-04-21 DIAGNOSIS — D62 Acute posthemorrhagic anemia: Secondary | ICD-10-CM | POA: Diagnosis not present

## 2016-04-21 DIAGNOSIS — K219 Gastro-esophageal reflux disease without esophagitis: Secondary | ICD-10-CM | POA: Diagnosis not present

## 2016-04-21 DIAGNOSIS — R5381 Other malaise: Secondary | ICD-10-CM

## 2016-04-21 DIAGNOSIS — K5900 Constipation, unspecified: Secondary | ICD-10-CM

## 2016-04-21 DIAGNOSIS — S7291XS Unspecified fracture of right femur, sequela: Secondary | ICD-10-CM

## 2016-04-21 DIAGNOSIS — N3281 Overactive bladder: Secondary | ICD-10-CM

## 2016-04-21 LAB — TYPE AND SCREEN
ABO/RH(D): O NEG
Antibody Screen: NEGATIVE
UNIT DIVISION: 0
Unit division: 0

## 2016-04-21 NOTE — Progress Notes (Signed)
Patient ID: Amanda Berry, female   DOB: 06-Jan-1934, 82 y.o.   MRN: [id number]    DATE:  04/21/2016   MRN:  [id number]  BIRTHDAY: 1933/10/08  Facility:  Nursing Home Location:  [facility] [place] Room Number: [id number]  LEVEL OF CARE:  SNF [phone])  Contact Information    Name Relation Home Work Mobile   Amanda Berry Spouse Amanda Berry Daughter [phone]     Amanda Berry, Amanda Berry   [phone]       Code Status History    Date Active Date Inactive Code Status Order ID Comments User Context   04/20/2016 12:31 AM 04/21/2016  1:52 AM Full Code [id number]  Reubin Milan, MD Inpatient   04/11/2016  4:06 PM 04/12/2016  6:19 PM Full Code [id number]  Netta Cedars, MD Inpatient       Chief Complaint  Patient presents with  . Hospitalization Follow-up    HISTORY OF PRESENT ILLNESS:  This is an 82 year old female who has been re-admitted to [facility] on 04/20/16. She was having a short-term rehabilitation @ [facility] when she was noted to have hgb 6.6 and transferred to the hospital.  She had a right femur fracture for which she had ORIF. She was transfused  with 2 units PRBC.   She has been re-admitted for continued short-term rehabilitation.   PAST MEDICAL HISTORY:  Past Medical History:  Diagnosis Date  . Constipation   . Fracture of femur, right, closed ([facility]) 2017  . Hypertension   . Overactive bladder   . Symptomatic anemia 03/2016     CURRENT MEDICATIONS: Reviewed  Patient's Medications  New Prescriptions   No medications on file  Previous Medications   ASPIRIN EC 325 MG EC TABLET    Take 1 tablet (325 mg total) by mouth 2 (two) times daily. Take for 4 weeks.   DOCUSATE SODIUM (COLACE) 100 MG CAPSULE    Take 1 capsule (100 mg total) by mouth 2 (two) times daily.   FERROUS SULFATE 325 (65 FE) MG TABLET    Take 1 tablet (325 mg total) by mouth 3 (three) times daily after meals.   HYDROCODONE-ACETAMINOPHEN (NORCO/VICODIN)  5-325 MG TABLET    Take 1-2 tablets by mouth every 4 (four) hours as needed (for pain).   LISINOPRIL (PRINIVIL,ZESTRIL) 20 MG TABLET    Take 20 mg by mouth daily.    OXYBUTYNIN (DITROPAN) 5 MG TABLET    Take 5 mg by mouth at bedtime.    POLYETHYLENE GLYCOL (MIRALAX / GLYCOLAX) PACKET    Take 17 Berry by mouth 2 (two) times daily.   RANITIDINE (ZANTAC) 300 MG TABLET    Take 300 mg by mouth at bedtime.    TIZANIDINE (ZANAFLEX) 4 MG TABLET    Take 1 tablet (4 mg total) by mouth every 6 (six) hours as needed for muscle spasms.  Modified Medications   No medications on file  Discontinued Medications   RED YEAST RICE EXTRACT (RED YEAST RICE PO)    Take 1 capsule by mouth every morning.      No Known Allergies   REVIEW OF SYSTEMS:  GENERAL: no change in appetite, no fatigue, no weight changes, no fever, chills or weakness EYES: Denies change in vision, dry eyes, eye pain, itching or discharge EARS: Denies change in hearing, ringing in ears, or earache NOSE: Denies nasal congestion or epistaxis MOUTH and THROAT: Denies oral discomfort, gingival pain or bleeding, pain from teeth  or hoarseness   RESPIRATORY: no cough, SOB, DOE, wheezing, hemoptysis CARDIAC: no chest pain, edema or palpitations GI: no abdominal pain, diarrhea, constipation, heart burn, nausea or vomiting GU: Denies dysuria, frequency, hematuria, incontinence, or discharge PSYCHIATRIC: Denies feeling of depression or anxiety. No report of hallucinations, insomnia, paranoia, or agitation     PHYSICAL EXAMINATION  GENERAL APPEARANCE: Well nourished. In no acute distress. Obese SKIN:  Right hip has aquacel dressing, dry and no erythema HEAD: Normal in size and contour. No evidence of trauma EYES: Lids open and close normally. No blepharitis, entropion or ectropion. PERRL. Conjunctivae are clear and sclerae are white. Lenses are without opacity EARS: Pinnae are normal. Patient hears normal voice tunes of the examiner MOUTH and  THROAT: Lips are without lesions. Oral mucosa is moist and without lesions. Tongue is normal in shape, size, and color and without lesions NECK: supple, trachea midline, no neck masses, no thyroid tenderness, no thyromegaly LYMPHATICS: no LAN in the neck, no supraclavicular LAN RESPIRATORY: breathing is even & unlabored, BS CTAB CARDIAC: RRR, no murmur,no extra heart sounds, no edema GI: abdomen soft, normal BS, no masses, no tenderness, no hepatomegaly, no splenomegaly EXTREMITIES:  Able to move X 4 extremities; has ACE wrap and immobilizer on RLE PSYCHIATRIC: Alert and oriented X 3. Affect and behavior are appropriate  LABS/RADIOLOGY: Labs reviewed: Basic Metabolic Panel:  Recent Labs  04/13/16 0432 04/14/16 0439 04/15/16 0420  NA 135 135 136  K 4.2 4.0 4.6  CL 105 106 105  CO2 23 25 25   GLUCOSE 150* 103* 107*  BUN 13 21* 23*  CREATININE 0.62 0.73 0.63  CALCIUM 7.9* 7.8* 7.9*   CBC:  Recent Labs  04/11/16 1302  04/14/16 0439 04/15/16 0420 04/20/16 0921  WBC 10.3  < > 9.6 8.8 9.1  NEUTROABS 8.2*  --   --   --   --   HGB 11.8*  < > 7.3* 7.1* 10.0*  HCT 35.6*  < > 21.4* 21.2* 31.0*  MCV 91.5  < > 91.1 92.2 94.5  PLT 216  < > 126* 131* 198  < > = values in this interval not displayed.    Chest Portable 1 View  Result Date: 04/11/2016 CLINICAL DATA:  Preoperative evaluation EXAM: PORTABLE CHEST 1 VIEW COMPARISON:  09/05/2012 FINDINGS: Cardiac shadow is within normal limits. Aortic calcifications are again seen. Lungs are well-aerated without focal infiltrate or sizable effusion. No acute bony abnormality is noted. IMPRESSION: No active disease. Electronically Signed   By: Inez Catalina M.D.   On: 04/11/2016 16:20   Dg Knee Complete 4 Views Right  Result Date: 04/11/2016 CLINICAL DATA:  Fall today. Right knee pain. Previous right knee arthroplasty. Initial encounter. EXAM: RIGHT KNEE - COMPLETE 4+ VIEW COMPARISON:  None. FINDINGS: Total knee arthroplasty seen. A  comminuted fracture of the distal femur is seen just above the femoral component of the prosthesis. There is mild posterior displacement and angulation of the distal fracture fragment. No other fractures identified. No evidence of dislocation. Generalized osteopenia noted. IMPRESSION: Comminuted fracture of distal femur just above femoral component of prosthesis, with mild posterior displacement and angulation. Electronically Signed   By: [place] Gell M.D.   On: 04/11/2016 12:40   Dg C-arm 1-60 Min-no Report  Result Date: 04/12/2016 CLINICAL DATA:  Operative fixation of a right distal femur fracture. EXAM: RIGHT FEMUR 2 VIEWS; DG C-ARM 1-60 MIN-NO REPORT COMPARISON:  Yesterday. FINDINGS: Five C-arm views of the right femur demonstrate interval intra medullary  rod and screw fixation of the previously demonstrated distal femur fracture. Significantly improved position and alignment of the major fragments with minimal anterior displacement of the distal fragment. A right knee prosthesis is again demonstrated. IMPRESSION: Hardware fixation of the previously described distal femur fracture. Electronically Signed   By: Claudie Revering M.D.   On: 04/12/2016 16:38   Dg Femur, Min 2 Views Right  Result Date: 04/12/2016 CLINICAL DATA:  Operative fixation of a right distal femur fracture. EXAM: RIGHT FEMUR 2 VIEWS; DG C-ARM 1-60 MIN-NO REPORT COMPARISON:  Yesterday. FINDINGS: Five C-arm views of the right femur demonstrate interval intra medullary rod and screw fixation of the previously demonstrated distal femur fracture. Significantly improved position and alignment of the major fragments with minimal anterior displacement of the distal fragment. A right knee prosthesis is again demonstrated. IMPRESSION: Hardware fixation of the previously described distal femur fracture. Electronically Signed   By: Claudie Revering M.D.   On: 04/12/2016 16:38    ASSESSMENT/PLAN:  Physical deconditioning - for rehabilitation, PT and OT  for therapeutic strengthening exercises; follow-up precaution  Anemia, acute blood loss - S/P transfusion of 2 units packed RBC; will monitor; continue ferrous sulfate 325 mg 1 tab by mouth twice a day Lab Results  Component Value Date   HGB 10.0 (L) 04/20/2016   Right femur fracture S/P ORIF - continue rehabilitation, PT and OT follow-up with Dr. Alvan Dame, orthopedic surgeon, in 1 week; continue Xanax 4 mg 1 tab by mouth every 6 hours for muscle spasm; Norco 5/325 mg 1-2 tabs by mouth every 4 hours when necessary for pain; continue aspirin EC 325 mg 1 tab by mouth twice a day till 05/15/16 for DVT prophylaxis  Hypertension - continue lisinopril 20 mg 1 tab by mouth daily; check BMP  OAB - continue oxybutynin 5 mg 1 tab by mouth daily at bedtime  Constipation - continue Colace 100 mg 1 capsule by mouth twice a day and MiraLAX 17 Berry by mouth twice a day  GERD - continue Zantac 300 mg 1 tab by mouth daily at bedtime    Goals of care:  Short-term rehabilitation     Durenda Age, NP [place] [phone]

## 2016-04-21 NOTE — ED Provider Notes (Signed)
[facility] DEPT Provider Note   CSN: [id number] Arrival date & time: 04/19/16  1834     History   Chief Complaint Chief Complaint  Patient presents with  . Anemia    HPI Amanda Berry is a 82 y.o. female.  HPI Per [facility] patient from [facility] for anemia, reported hemoglobin 6.6. Patient denies any other complaints. Patient has right femur in brace due to femur fracture on 04/11/16. Had recent surgery. Patient alert and oriented at this time.  Past Medical History:  Diagnosis Date  . Constipation   . Fracture of femur, right, closed ([place]) 2017  . Hypertension   . Overactive bladder   . Symptomatic anemia 03/2016    Patient Active Problem List   Diagnosis Date Noted  . Symptomatic anemia 04/19/2016  . Hypertension 04/19/2016  . Constipation 04/19/2016  . Overactive bladder 04/19/2016  . Femur fracture, right, closed, initial encounter 04/11/2016    Past Surgical History:  Procedure Laterality Date  . KNEE ARTHROPLASTY Right    approx 10 years ago  . ORIF PERIPROSTHETIC FRACTURE Right 04/12/2016   Procedure: OPEN REDUCTION INTERNAL FIXATION (ORIF) RIGHT DISTAL FEMUR  PERIPROSTHETIC FRACTURE;  Surgeon: Paralee Cancel, MD;  Location: [facility];  Service: Orthopedics;  Laterality: Right;  . ROTATOR CUFF REPAIR Right 1990's    OB History    No data available       Home Medications    Prior to Admission medications   Medication Sig Start Date End Date Taking? Authorizing Provider  aspirin EC 325 MG EC tablet Take 1 tablet (325 mg total) by mouth 2 (two) times daily. Take for 4 weeks. 04/15/16 05/15/16 Yes Matthew Babish, PA-C  docusate sodium (COLACE) 100 MG capsule Take 1 capsule (100 mg total) by mouth 2 (two) times daily. 04/15/16  Yes Danae Orleans, PA-C  ferrous sulfate 325 (65 FE) MG tablet Take 1 tablet (325 mg total) by mouth 3 (three) times daily after meals. 04/15/16  Yes Matthew Babish, PA-C  lisinopril (PRINIVIL,ZESTRIL) 20 MG tablet Take 20 mg by mouth  daily.  01/15/16  Yes Historical Provider, MD  oxybutynin (DITROPAN) 5 MG tablet Take 5 mg by mouth at bedtime.  03/23/16  Yes Historical Provider, MD  polyethylene glycol (MIRALAX / GLYCOLAX) packet Take 17 g by mouth 2 (two) times daily. 04/15/16  Yes Danae Orleans, PA-C  ranitidine (ZANTAC) 300 MG tablet Take 300 mg by mouth at bedtime.  02/17/16  Yes Historical Provider, MD  tiZANidine (ZANAFLEX) 4 MG tablet Take 1 tablet (4 mg total) by mouth every 6 (six) hours as needed for muscle spasms. 04/15/16  Yes Danae Orleans, PA-C  HYDROcodone-acetaminophen (NORCO/VICODIN) 5-325 MG tablet Take 1-2 tablets by mouth every 4 (four) hours as needed (for pain). 04/20/16   Verlee Monte, MD    Family History Family History  Problem Relation Age of Onset  . Hypertension Mother   . Colon cancer Father   . Valvular heart disease Sister   . Kidney disease Brother   . Alcoholism Brother   . Lung cancer Sister     Social History Social History  Substance Use Topics  . Smoking status: Never Smoker  . Smokeless tobacco: Never Used  . Alcohol use No     Allergies   Review of patient's allergies indicates no known allergies.   Review of Systems Review of Systems  Constitutional: Negative for fever.  Respiratory: Negative for shortness of breath.   Gastrointestinal: Negative for anal bleeding and blood in stool.  All other systems reviewed and are negative.    Physical Exam Updated Vital Signs BP (!) 136/54   Pulse 96   Temp 98.6 F (37 C)   Resp (!) 21   Ht 5' (1.524 m)   Wt 177 lb 4 oz (80.4 kg)   SpO2 95%   BMI 34.62 kg/m   Physical Exam  Constitutional: She is oriented to person, place, and time. She appears well-developed and well-nourished. No distress.  HENT:  Head: Normocephalic and atraumatic.  Eyes: Pupils are equal, round, and reactive to light.  Neck: Normal range of motion.  Cardiovascular: Normal rate and intact distal pulses.   Pulmonary/Chest: No respiratory  distress.  Abdominal: Normal appearance. She exhibits no distension.  Musculoskeletal:  Knee immobilizer in place over recent fracture and surgical sight.  Neurological: She is alert and oriented to person, place, and time. No cranial nerve deficit.  Skin: Skin is warm and dry. No rash noted.  Psychiatric: She has a normal mood and affect. Her behavior is normal.  Nursing note and vitals reviewed.    ED Treatments / Results  Labs (all labs ordered are listed, but only abnormal results are displayed) Labs Reviewed  CBC - Abnormal; Notable for the following:       Result Value   RBC 3.28 (*)    Hemoglobin 10.0 (*)    HCT 31.0 (*)    RDW 16.0 (*)    All other components within normal limits  PREPARE RBC (CROSSMATCH)  TYPE AND SCREEN  ABO/RH    EKG  EKG Interpretation None       Radiology No results found.  Procedures Procedures (including critical care time)  Medications Ordered in ED Medications - No data to display   Initial Impression / Assessment and Plan / ED Course  I have reviewed the triage vital signs and the nursing notes.  Pertinent labs & imaging results that were available during my care of the patient were reviewed by me and considered in my medical decision making (see chart for details).  Clinical Course   Orthopedics consulted.  Patient was typed and crossmatched for 2 units PRBC   Final Clinical Impressions(s) / ED Diagnoses   Final diagnoses:  Symptomatic anemia    New Prescriptions Discharge Medication List as of 04/20/2016  4:04 PM       Leonard Schwartz, MD 04/21/16 1413

## 2016-04-22 DIAGNOSIS — Z79899 Other long term (current) drug therapy: Secondary | ICD-10-CM | POA: Diagnosis not present

## 2016-04-22 DIAGNOSIS — D649 Anemia, unspecified: Secondary | ICD-10-CM | POA: Diagnosis not present

## 2016-04-22 LAB — CBC AND DIFFERENTIAL
HEMATOCRIT: 31 % — AB (ref 36–46)
Hemoglobin: 10 g/dL — AB (ref 12.0–16.0)
PLATELETS: 266 10*3/uL (ref 150–399)
WBC: 7.7 10^3/mL

## 2016-04-22 LAB — BASIC METABOLIC PANEL
BUN: 19 mg/dL (ref 4–21)
Creatinine: 0.7 mg/dL (ref 0.5–1.1)
GLUCOSE: 88 mg/dL
Potassium: 4.8 mmol/L (ref 3.4–5.3)
Sodium: 140 mmol/L (ref 137–147)

## 2016-04-23 ENCOUNTER — Non-Acute Institutional Stay (SKILLED_NURSING_FACILITY): Payer: PPO | Admitting: Internal Medicine

## 2016-04-23 ENCOUNTER — Encounter: Payer: Self-pay | Admitting: Internal Medicine

## 2016-04-23 DIAGNOSIS — I10 Essential (primary) hypertension: Secondary | ICD-10-CM | POA: Diagnosis not present

## 2016-04-23 DIAGNOSIS — D62 Acute posthemorrhagic anemia: Secondary | ICD-10-CM

## 2016-04-23 DIAGNOSIS — R2681 Unsteadiness on feet: Secondary | ICD-10-CM

## 2016-04-23 DIAGNOSIS — S728X1S Other fracture of right femur, sequela: Secondary | ICD-10-CM

## 2016-04-23 DIAGNOSIS — K5900 Constipation, unspecified: Secondary | ICD-10-CM

## 2016-04-23 NOTE — Progress Notes (Signed)
LOCATION: [facility]  PCP: Shirline Frees, MD   Code Status: Full Code  Goals of care: Advanced Directive information Advanced Directives 04/20/2016  Does patient have an advance directive? No  Would patient like information on creating an advanced directive? No - patient declined information       Extended Emergency Contact Information Primary Emergency Contact: Reasner,Virgil G Address: [address]          [place] [address] [place] of [place] Phone: [phone] Relation: Spouse Secondary Emergency Contact: Christella Noa, [place] [address] [place] of [place] Phone: [phone] Relation: Daughter   No Known Allergies  Chief Complaint  Patient presents with  . Readmit To SNF    Readmission     HPI:  Patient is a 82 y.o. female seen today for short term rehabilitation post hospital re-admission from 04/19/16-04/20/16 with symptomatic anemia post blood loss from recent surgery. She received 2 u prbc transfusion and had improvement in her hemoglobin level.  She is seen in her room today. She was here undergoing short term rehabilitation post hospital admission from 04/11/16-04/15/16 with right femur closed fracture s/p open reduction and internal fixation. Her pain medication has been helpful.    Review of Systems:  Constitutional: Negative for fever, chills, diaphoresis. Energy level is slowly coming back.  HENT: Negative for headache, congestion, nasal discharge. Eyes: Negative for blurred vision, double vision and discharge.  Respiratory: Negative for cough, shortness of breath and wheezing.   Cardiovascular: Negative for chest pain, palpitations, leg swelling.  Gastrointestinal: Negative for heartburn, nausea, vomiting, abdominal pain. Passing flatus. She had bowel movement yesterday.  Genitourinary: Negative for dysuria.  Musculoskeletal: Negative for back pain, fall in the facility.  Skin: Negative for itching, rash.    Neurological: Negative for dizziness. Psychiatric/Behavioral: Negative for depression   Past Medical History:  Diagnosis Date  . Constipation   . Fracture of femur, right, closed ([place]) 2017  . Hypertension   . Overactive bladder   . Symptomatic anemia 03/2016   Past Surgical History:  Procedure Laterality Date  . KNEE ARTHROPLASTY Right    approx 10 years ago  . ORIF PERIPROSTHETIC FRACTURE Right 04/12/2016   Procedure: OPEN REDUCTION INTERNAL FIXATION (ORIF) RIGHT DISTAL FEMUR  PERIPROSTHETIC FRACTURE;  Surgeon: Paralee Cancel, MD;  Location: [facility];  Service: Orthopedics;  Laterality: Right;  . ROTATOR CUFF REPAIR Right 1990's   Social History:   reports that she has never smoked. She has never used smokeless tobacco. She reports that she does not drink alcohol or use drugs.  Family History  Problem Relation Age of Onset  . Hypertension Mother   . Colon cancer Father   . Valvular heart disease Sister   . Kidney disease Brother   . Alcoholism Brother   . Lung cancer Sister     Medications:   Medication List       Accurate as of 04/23/16  3:26 PM. Always use your most recent med list.          aspirin 325 MG EC tablet Take 325 mg by mouth daily. Stop date 05/15/16   docusate sodium 100 MG capsule Commonly known as:  COLACE Take 1 capsule (100 mg total) by mouth 2 (two) times daily.   ferrous sulfate 325 (65 FE) MG tablet Take 1 tablet (325 mg total) by mouth 3 (three) times daily after meals.   HYDROcodone-acetaminophen 5-325 MG tablet Commonly known as:  NORCO/VICODIN Take 1-2 tablets by mouth every 4 (four) hours as needed (for pain).   lisinopril 20 MG tablet Commonly known as:  PRINIVIL,ZESTRIL Take 20 mg by mouth daily.   oxybutynin 5 MG tablet Commonly known as:  DITROPAN Take 5 mg by mouth at bedtime.   polyethylene glycol packet Commonly known as:  MIRALAX / GLYCOLAX Take 17 g by mouth 2 (two) times daily.   PROCEL Powd Take 2 scoop by mouth  2 (two) times daily.   ranitidine 300 MG tablet Commonly known as:  ZANTAC Take 300 mg by mouth at bedtime.   Red Yeast Rice 600 MG Caps Take 1 capsule by mouth daily.   tiZANidine 4 MG tablet Commonly known as:  ZANAFLEX Take 1 tablet (4 mg total) by mouth every 6 (six) hours as needed for muscle spasms.       Immunizations: Immunization History  Administered Date(s) Administered  . PPD Test 04/15/2016     Physical Exam: Vitals:   04/23/16 1519  BP: (!) 128/58  Pulse: (!) 104  Resp: 18  Temp: 98.9 F (37.2 C)  TempSrc: Oral  SpO2: 96%  Weight: 159 lb (72.1 kg)  Height: 5' (1.524 m)   Body mass index is 31.05 kg/m.  Bedside HR 88/min resting  General- elderly female, obese, in no acute distress Head- normocephalic, atraumatic Throat- moist mucus membrane Eyes- PERRLA, EOMI, no pallor, no icterus Neck- no cervical lymphadenopathy Cardiovascular- normal s1,s2, no murmur, trace leg edema Respiratory- bilateral clear to auscultation, no wheeze, no rhonchi, no crackles, no use of accessory muscles Abdomen- bowel sounds present, soft, non tender Musculoskeletal- able to move all 4 extremities, limited right leg range of motion, right leg immobilizer in place Neurological- alert and oriented to person, place and time Skin- warm and dry Psychiatry- normal mood and affect    Labs reviewed: Basic Metabolic Panel:  Recent Labs  04/13/16 0432 04/14/16 0439 04/15/16 0420 04/19/16 04/22/16  NA 135 135 136 138 140  K 4.2 4.0 4.6 4.3 4.8  CL 105 106 105  --   --   CO2 23 25 25   --   --   GLUCOSE 150* 103* 107*  --   --   BUN 13 21* 23* 16 19  CREATININE 0.62 0.73 0.63 0.6 0.7  CALCIUM 7.9* 7.8* 7.9*  --   --    Liver Function Tests:  Recent Labs  04/19/16  AST 39*  ALT 41*  ALKPHOS 145*   No results for input(s): LIPASE, AMYLASE in the last 8760 hours. No results for input(s): AMMONIA in the last 8760 hours. CBC:  Recent Labs  04/11/16 1302   04/14/16 0439 04/15/16 0420 04/20/16 0921 04/22/16  WBC 10.3  < > 9.6 8.8 9.1 7.7  NEUTROABS 8.2*  --   --   --   --   --   HGB 11.8*  < > 7.3* 7.1* 10.0* 10.0*  HCT 35.6*  < > 21.4* 21.2* 31.0* 31*  MCV 91.5  < > 91.1 92.2 94.5  --   PLT 216  < > 126* 131* 198 266  < > = values in this interval not displayed. Cardiac Enzymes: No results for input(s): CKTOTAL, CKMB, CKMBINDEX, TROPONINI in the last 8760 hours. BNP: Invalid input(s): POCBNP CBG: No results for input(s): GLUCAP in the last 8760 hours.  Radiological Exams: Chest Portable 1 View  Result Date: 04/11/2016 CLINICAL DATA:  Preoperative evaluation EXAM: PORTABLE CHEST 1 VIEW COMPARISON:  09/05/2012 FINDINGS: Cardiac  shadow is within normal limits. Aortic calcifications are again seen. Lungs are well-aerated without focal infiltrate or sizable effusion. No acute bony abnormality is noted. IMPRESSION: No active disease. Electronically Signed   By: Inez Catalina M.D.   On: 04/11/2016 16:20   Dg Knee Complete 4 Views Right  Result Date: 04/11/2016 CLINICAL DATA:  Fall today. Right knee pain. Previous right knee arthroplasty. Initial encounter. EXAM: RIGHT KNEE - COMPLETE 4+ VIEW COMPARISON:  None. FINDINGS: Total knee arthroplasty seen. A comminuted fracture of the distal femur is seen just above the femoral component of the prosthesis. There is mild posterior displacement and angulation of the distal fracture fragment. No other fractures identified. No evidence of dislocation. Generalized osteopenia noted. IMPRESSION: Comminuted fracture of distal femur just above femoral component of prosthesis, with mild posterior displacement and angulation. Electronically Signed   By: Earle Gell M.D.   On: 04/11/2016 12:40   Dg C-arm 1-60 Min-no Report  Result Date: 04/12/2016 CLINICAL DATA:  Operative fixation of a right distal femur fracture. EXAM: RIGHT FEMUR 2 VIEWS; DG C-ARM 1-60 MIN-NO REPORT COMPARISON:  Yesterday. FINDINGS: Five C-arm  views of the right femur demonstrate interval intra medullary rod and screw fixation of the previously demonstrated distal femur fracture. Significantly improved position and alignment of the major fragments with minimal anterior displacement of the distal fragment. A right knee prosthesis is again demonstrated. IMPRESSION: Hardware fixation of the previously described distal femur fracture. Electronically Signed   By: Claudie Revering M.D.   On: 04/12/2016 16:38   Dg Femur, Min 2 Views Right  Result Date: 04/12/2016 CLINICAL DATA:  Operative fixation of a right distal femur fracture. EXAM: RIGHT FEMUR 2 VIEWS; DG C-ARM 1-60 MIN-NO REPORT COMPARISON:  Yesterday. FINDINGS: Five C-arm views of the right femur demonstrate interval intra medullary rod and screw fixation of the previously demonstrated distal femur fracture. Significantly improved position and alignment of the major fragments with minimal anterior displacement of the distal fragment. A right knee prosthesis is again demonstrated. IMPRESSION: Hardware fixation of the previously described distal femur fracture. Electronically Signed   By: Claudie Revering M.D.   On: 04/12/2016 16:38    Assessment/Plan   Unsteady gait With right femoral fracture. Will have patient work with PT/OT as tolerated to regain strength and restore function.  Fall precautions are in place.  Blood loss anemia Improved H&H post blood transfusion. Continue feso4 325 mg tid and monitor cbc  Right femur fracture S/p ORIF, has orthopedics follow up. Continue norco 5-325 mg 1-2 tab q4h prn pain and aspirin 325 mg bid for dvt prophylaxis. Continue zanaflex 4 mg q6h prn muscle spasm. Will have her work with physical therapy and occupational therapy team to help with gait training and muscle strengthening exercises.fall precautions. Skin care. Encourage to be out of bed. Add ted hose to both legs to help with edema  Constipation On miralax 17 g bid with colace 100 mg bid.    HTN Monitor bp reading, continue lisinopril 20 mg daily. Check bmp     Goals of care: short term rehabilitation   Labs/tests ordered: cbc in 1 week  Family/ staff Communication: reviewed care plan with patient and nursing supervisor    Blanchie Serve, MD Internal Medicine [place], [place] [address] Cell Phone (Monday-Friday 8 am - 5 pm): [phone] On Call: [phone] and follow prompts after 5 pm and on weekends Office Phone: [phone] Office Fax: [phone]

## 2016-04-28 DIAGNOSIS — M978XXA Periprosthetic fracture around other internal prosthetic joint, initial encounter: Secondary | ICD-10-CM | POA: Diagnosis not present

## 2016-04-30 DIAGNOSIS — T84124D Displacement of internal fixation device of right femur, subsequent encounter: Secondary | ICD-10-CM | POA: Diagnosis not present

## 2016-04-30 DIAGNOSIS — Z4789 Encounter for other orthopedic aftercare: Secondary | ICD-10-CM | POA: Diagnosis not present

## 2016-04-30 DIAGNOSIS — D62 Acute posthemorrhagic anemia: Secondary | ICD-10-CM | POA: Diagnosis not present

## 2016-04-30 DIAGNOSIS — R262 Difficulty in walking, not elsewhere classified: Secondary | ICD-10-CM | POA: Diagnosis not present

## 2016-04-30 DIAGNOSIS — D649 Anemia, unspecified: Secondary | ICD-10-CM | POA: Diagnosis not present

## 2016-04-30 DIAGNOSIS — M979XXD Periprosthetic fracture around unspecified internal prosthetic joint, subsequent encounter: Secondary | ICD-10-CM | POA: Diagnosis not present

## 2016-04-30 DIAGNOSIS — M6281 Muscle weakness (generalized): Secondary | ICD-10-CM | POA: Diagnosis not present

## 2016-05-05 ENCOUNTER — Non-Acute Institutional Stay (SKILLED_NURSING_FACILITY): Payer: PPO | Admitting: Adult Health

## 2016-05-05 ENCOUNTER — Encounter: Payer: Self-pay | Admitting: Adult Health

## 2016-05-05 DIAGNOSIS — K219 Gastro-esophageal reflux disease without esophagitis: Secondary | ICD-10-CM

## 2016-05-05 DIAGNOSIS — K5900 Constipation, unspecified: Secondary | ICD-10-CM | POA: Diagnosis not present

## 2016-05-05 DIAGNOSIS — N3281 Overactive bladder: Secondary | ICD-10-CM

## 2016-05-05 DIAGNOSIS — D62 Acute posthemorrhagic anemia: Secondary | ICD-10-CM

## 2016-05-05 DIAGNOSIS — I10 Essential (primary) hypertension: Secondary | ICD-10-CM | POA: Diagnosis not present

## 2016-05-05 DIAGNOSIS — Z9181 History of falling: Secondary | ICD-10-CM | POA: Diagnosis not present

## 2016-05-05 DIAGNOSIS — R5381 Other malaise: Secondary | ICD-10-CM

## 2016-05-05 DIAGNOSIS — S728X1S Other fracture of right femur, sequela: Secondary | ICD-10-CM | POA: Diagnosis not present

## 2016-05-05 DIAGNOSIS — Z4789 Encounter for other orthopedic aftercare: Secondary | ICD-10-CM | POA: Diagnosis not present

## 2016-05-05 NOTE — Progress Notes (Signed)
Patient ID: Amanda Berry, female   DOB: 1934-05-14, 82 y.o.   MRN: [id number]    DATE:  05/05/2016   MRN:  [id number]  BIRTHDAY: 1933-11-17  Facility:  Nursing Home Location:  [place] Room Number: [id number]  LEVEL OF CARE:  SNF [phone])  Contact Information    Name Relation Home Work Mobile   Citro,[name] [place] Daughter [phone]     Clintonia, Cartaya   [phone]       Code Status History    Date Active Date Inactive Code Status Order ID Comments User Context   04/20/2016 12:31 AM 04/21/2016  1:52 AM Full Code [id number]  Reubin Milan, MD Inpatient   04/11/2016  4:06 PM 04/12/2016  6:19 PM Full Code [id number]  Netta Cedars, MD Inpatient       Chief Complaint  Patient presents with  . Discharge Note    HISTORY OF PRESENT ILLNESS:  This is an 82 year old female who is for discharge home with Home health PT, OT and CNA. She will discharge with medications and prescriptions. DME:  Standard wheelchair, elevating leg rests, removable arm rest, cushion, anti-tippers and bedside commode.  She has been re-admitted to [facility] on 04/20/16. She was having a short-term rehabilitation @ [facility] when she was noted to have hgb 6.6 and transferred to the hospital.  She had a right femur fracture for which she had ORIF. She was transfused  with 2 units PRBC.   Patient was admitted to this facility for short-term rehabilitation after the patient's recent hospitalization.  Patient has completed SNF rehabilitation and therapy has cleared the patient for discharge.   PAST MEDICAL HISTORY:  Past Medical History:  Diagnosis Date  . Constipation   . Fracture of femur, right, closed ([place]) 2017  . Hypertension   . Overactive bladder   . Symptomatic anemia 03/2016     CURRENT MEDICATIONS: Reviewed  Patient's Medications  New Prescriptions   No medications on file  Previous Medications   ASPIRIN 325 MG EC TABLET     Take 325 mg by mouth daily. Stop date 05/15/16    DOCUSATE SODIUM (COLACE) 100 MG CAPSULE    Take 1 capsule (100 mg total) by mouth 2 (two) times daily.   FERROUS SULFATE 325 (65 FE) MG TABLET    Take 325 mg by mouth 2 (two) times daily with a meal.   HYDROCODONE-ACETAMINOPHEN (NORCO/VICODIN) 5-325 MG TABLET    Take 1-2 tablets by mouth every 4 (four) hours as needed (for pain).   LISINOPRIL (PRINIVIL,ZESTRIL) 20 MG TABLET    Take 20 mg by mouth daily.    OXYBUTYNIN (DITROPAN) 5 MG TABLET    Take 5 mg by mouth at bedtime.    POLYETHYLENE GLYCOL (MIRALAX / GLYCOLAX) PACKET    Take 17 g by mouth 2 (two) times daily.   PROTEIN (PROCEL) POWD    Take 2 scoop by mouth 2 (two) times daily.   RANITIDINE (ZANTAC) 300 MG TABLET    Take 300 mg by mouth at bedtime.    RED YEAST RICE 600 MG CAPS    Take 1 capsule by mouth daily.   TIZANIDINE (ZANAFLEX) 4 MG TABLET    Take 1 tablet (4 mg total) by mouth every 6 (six) hours as needed for muscle spasms.  Modified Medications   No medications on file  Discontinued Medications   FERROUS SULFATE 325 (65 FE) MG TABLET  Take 1 tablet (325 mg total) by mouth 3 (three) times daily after meals.     No Known Allergies   REVIEW OF SYSTEMS:  GENERAL: no change in appetite, no fatigue, no weight changes, no fever, chills or weakness EYES: Denies change in vision, dry eyes, eye pain, itching or discharge EARS: Denies change in hearing, ringing in ears, or earache NOSE: Denies nasal congestion or epistaxis MOUTH and THROAT: Denies oral discomfort, gingival pain or bleeding, pain from teeth or hoarseness   RESPIRATORY: no cough, SOB, DOE, wheezing, hemoptysis CARDIAC: no chest pain, edema or palpitations GI: no abdominal pain, diarrhea, constipation, heart burn, nausea or vomiting GU: Denies dysuria, frequency, hematuria, incontinence, or discharge PSYCHIATRIC: Denies feeling of depression or anxiety. No report of hallucinations, insomnia, paranoia, or  agitation     PHYSICAL EXAMINATION  GENERAL APPEARANCE: Well nourished. In no acute distress. Obese SKIN:  Right thigh incision is healed HEAD: Normal in size and contour. No evidence of trauma EYES: Lids open and close normally. No blepharitis, entropion or ectropion. PERRL. Conjunctivae are clear and sclerae are white. Lenses are without opacity EARS: Pinnae are normal. Patient hears normal voice tunes of the examiner MOUTH and THROAT: Lips are without lesions. Oral mucosa is moist and without lesions. Tongue is normal in shape, size, and color and without lesions NECK: supple, trachea midline, no neck masses, no thyroid tenderness, no thyromegaly LYMPHATICS: no LAN in the neck, no supraclavicular LAN RESPIRATORY: breathing is even & unlabored, BS CTAB CARDIAC: RRR, no murmur,no extra heart sounds, no edema GI: abdomen soft, normal BS, no masses, no tenderness, no hepatomegaly, no splenomegaly EXTREMITIES:  Able to move X 4 extremities; has immobilizer on RLE PSYCHIATRIC: Alert and oriented X 3. Affect and behavior are appropriate  LABS/RADIOLOGY: Labs reviewed: Basic Metabolic Panel:  Recent Labs  04/13/16 0432 04/14/16 0439 04/15/16 0420 04/19/16 04/22/16  NA 135 135 136 138 140  K 4.2 4.0 4.6 4.3 4.8  CL 105 106 105  --   --   CO2 23 25 25   --   --   GLUCOSE 150* 103* 107*  --   --   BUN 13 21* 23* 16 19  CREATININE 0.62 0.73 0.63 0.6 0.7  CALCIUM 7.9* 7.8* 7.9*  --   --    CBC:  Recent Labs  04/11/16 1302  04/14/16 0439 04/15/16 0420 04/20/16 0921 04/22/16  WBC 10.3  < > 9.6 8.8 9.1 7.7  NEUTROABS 8.2*  --   --   --   --   --   HGB 11.8*  < > 7.3* 7.1* 10.0* 10.0*  HCT 35.6*  < > 21.4* 21.2* 31.0* 31*  MCV 91.5  < > 91.1 92.2 94.5  --   PLT 216  < > 126* 131* 198 266  < > = values in this interval not displayed.    Chest Portable 1 View  Result Date: 04/11/2016 CLINICAL DATA:  Preoperative evaluation EXAM: PORTABLE CHEST 1 VIEW COMPARISON:  09/05/2012  FINDINGS: Cardiac shadow is within normal limits. Aortic calcifications are again seen. Lungs are well-aerated without focal infiltrate or sizable effusion. No acute bony abnormality is noted. IMPRESSION: No active disease. Electronically Signed   By: Inez Catalina M.D.   On: 04/11/2016 16:20   Dg Knee Complete 4 Views Right  Result Date: 04/11/2016 CLINICAL DATA:  Fall today. Right knee pain. Previous right knee arthroplasty. Initial encounter. EXAM: RIGHT KNEE - COMPLETE 4+ VIEW COMPARISON:  None. FINDINGS: Total knee  arthroplasty seen. A comminuted fracture of the distal femur is seen just above the femoral component of the prosthesis. There is mild posterior displacement and angulation of the distal fracture fragment. No other fractures identified. No evidence of dislocation. Generalized osteopenia noted. IMPRESSION: Comminuted fracture of distal femur just above femoral component of prosthesis, with mild posterior displacement and angulation. Electronically Signed   By: Earle Gell M.D.   On: 04/11/2016 12:40   Dg C-arm 1-60 Min-no Report  Result Date: 04/12/2016 CLINICAL DATA:  Operative fixation of a right distal femur fracture. EXAM: RIGHT FEMUR 2 VIEWS; DG C-ARM 1-60 MIN-NO REPORT COMPARISON:  Yesterday. FINDINGS: Five C-arm views of the right femur demonstrate interval intra medullary rod and screw fixation of the previously demonstrated distal femur fracture. Significantly improved position and alignment of the major fragments with minimal anterior displacement of the distal fragment. A right knee prosthesis is again demonstrated. IMPRESSION: Hardware fixation of the previously described distal femur fracture. Electronically Signed   By: Claudie Revering M.D.   On: 04/12/2016 16:38   Dg Femur, Min 2 Views Right  Result Date: 04/12/2016 CLINICAL DATA:  Operative fixation of a right distal femur fracture. EXAM: RIGHT FEMUR 2 VIEWS; DG C-ARM 1-60 MIN-NO REPORT COMPARISON:  Yesterday. FINDINGS: Five  C-arm views of the right femur demonstrate interval intra medullary rod and screw fixation of the previously demonstrated distal femur fracture. Significantly improved position and alignment of the major fragments with minimal anterior displacement of the distal fragment. A right knee prosthesis is again demonstrated. IMPRESSION: Hardware fixation of the previously described distal femur fracture. Electronically Signed   By: Claudie Revering M.D.   On: 04/12/2016 16:38    ASSESSMENT/PLAN:  Physical deconditioning - for Home health CNA, PT and OT, for therapeutic strengthening exercises; fall precaution  Anemia, acute blood loss - S/P transfusion of 2 units packed RBC;  continue ferrous sulfate 325 mg 1 tab by mouth twice a day X 2 more weeks Lab Results  Component Value Date   HGB 10.0 (A) 04/22/2016   Right femur fracture S/P ORIF - Home health CNA PT and OT follow-up with Dr. Alvan Dame, orthopedic surgeon, in 1 week; continue Xanax 4 mg 1 tab by mouth every 6 hours for muscle spasm; Norco 5/325 mg 1-2 tabs by mouth every 4 hours when necessary for pain; continue aspirin EC 325 mg 1 tab by mouth twice a day till 05/15/16 for DVT prophylaxis  Hypertension - well-controlled; continue lisinopril 20 mg 1 tab by mouth daily  OAB - continue oxybutynin 5 mg 1 tab by mouth daily at bedtime  Constipation - continue Colace 100 mg 1 capsule by mouth twice a day and MiraLAX 17 g by mouth twice a day  GERD - continue Zantac 300 mg 1 tab by mouth daily at bedtime     I have filled out patient's discharge paperwork and written prescriptions.  Patient will receive home health PT, OT and CNA.  DME provided:  Standard wheelchair, elevating leg rests, removable arm rest, cushion, anti-tippers and bedside commode Greater than 50% was spent in counseling and coordination of care with the patient.   Total discharge time: Greater than 30 minutes  Discharge time involved coordination of the discharge process with  social worker, nursing staff and therapy department. Medical justification for home health services/DME verified.   Durenda Age, NP [employer] [phone]

## 2016-05-08 DIAGNOSIS — Z96651 Presence of right artificial knee joint: Secondary | ICD-10-CM | POA: Diagnosis not present

## 2016-05-08 DIAGNOSIS — W19XXXD Unspecified fall, subsequent encounter: Secondary | ICD-10-CM | POA: Diagnosis not present

## 2016-05-08 DIAGNOSIS — M9711XD Periprosthetic fracture around internal prosthetic right knee joint, subsequent encounter: Secondary | ICD-10-CM | POA: Diagnosis not present

## 2016-05-08 DIAGNOSIS — D62 Acute posthemorrhagic anemia: Secondary | ICD-10-CM | POA: Diagnosis not present

## 2016-05-08 DIAGNOSIS — I10 Essential (primary) hypertension: Secondary | ICD-10-CM | POA: Diagnosis not present

## 2016-05-08 DIAGNOSIS — Z79891 Long term (current) use of opiate analgesic: Secondary | ICD-10-CM | POA: Diagnosis not present

## 2016-05-08 DIAGNOSIS — Z7982 Long term (current) use of aspirin: Secondary | ICD-10-CM | POA: Diagnosis not present

## 2016-05-08 DIAGNOSIS — S72401D Unspecified fracture of lower end of right femur, subsequent encounter for closed fracture with routine healing: Secondary | ICD-10-CM | POA: Diagnosis not present

## 2016-05-08 DIAGNOSIS — Z9181 History of falling: Secondary | ICD-10-CM | POA: Diagnosis not present

## 2016-05-10 DIAGNOSIS — I10 Essential (primary) hypertension: Secondary | ICD-10-CM | POA: Diagnosis not present

## 2016-05-10 DIAGNOSIS — S72401D Unspecified fracture of lower end of right femur, subsequent encounter for closed fracture with routine healing: Secondary | ICD-10-CM | POA: Diagnosis not present

## 2016-05-10 DIAGNOSIS — W19XXXD Unspecified fall, subsequent encounter: Secondary | ICD-10-CM | POA: Diagnosis not present

## 2016-05-10 DIAGNOSIS — Z7982 Long term (current) use of aspirin: Secondary | ICD-10-CM | POA: Diagnosis not present

## 2016-05-10 DIAGNOSIS — D62 Acute posthemorrhagic anemia: Secondary | ICD-10-CM | POA: Diagnosis not present

## 2016-05-10 DIAGNOSIS — Z96651 Presence of right artificial knee joint: Secondary | ICD-10-CM | POA: Diagnosis not present

## 2016-05-10 DIAGNOSIS — M9711XD Periprosthetic fracture around internal prosthetic right knee joint, subsequent encounter: Secondary | ICD-10-CM | POA: Diagnosis not present

## 2016-05-10 DIAGNOSIS — Z79891 Long term (current) use of opiate analgesic: Secondary | ICD-10-CM | POA: Diagnosis not present

## 2016-05-10 DIAGNOSIS — Z9181 History of falling: Secondary | ICD-10-CM | POA: Diagnosis not present

## 2016-05-17 DIAGNOSIS — Z96651 Presence of right artificial knee joint: Secondary | ICD-10-CM | POA: Diagnosis not present

## 2016-05-17 DIAGNOSIS — Z7982 Long term (current) use of aspirin: Secondary | ICD-10-CM | POA: Diagnosis not present

## 2016-05-17 DIAGNOSIS — Z9181 History of falling: Secondary | ICD-10-CM | POA: Diagnosis not present

## 2016-05-17 DIAGNOSIS — W19XXXD Unspecified fall, subsequent encounter: Secondary | ICD-10-CM | POA: Diagnosis not present

## 2016-05-17 DIAGNOSIS — S72401D Unspecified fracture of lower end of right femur, subsequent encounter for closed fracture with routine healing: Secondary | ICD-10-CM | POA: Diagnosis not present

## 2016-05-17 DIAGNOSIS — Z79891 Long term (current) use of opiate analgesic: Secondary | ICD-10-CM | POA: Diagnosis not present

## 2016-05-17 DIAGNOSIS — I10 Essential (primary) hypertension: Secondary | ICD-10-CM | POA: Diagnosis not present

## 2016-05-17 DIAGNOSIS — M9711XD Periprosthetic fracture around internal prosthetic right knee joint, subsequent encounter: Secondary | ICD-10-CM | POA: Diagnosis not present

## 2016-05-17 DIAGNOSIS — D62 Acute posthemorrhagic anemia: Secondary | ICD-10-CM | POA: Diagnosis not present

## 2016-05-18 DIAGNOSIS — Z9181 History of falling: Secondary | ICD-10-CM | POA: Diagnosis not present

## 2016-05-18 DIAGNOSIS — I10 Essential (primary) hypertension: Secondary | ICD-10-CM | POA: Diagnosis not present

## 2016-05-18 DIAGNOSIS — S72401D Unspecified fracture of lower end of right femur, subsequent encounter for closed fracture with routine healing: Secondary | ICD-10-CM | POA: Diagnosis not present

## 2016-05-18 DIAGNOSIS — Z96651 Presence of right artificial knee joint: Secondary | ICD-10-CM | POA: Diagnosis not present

## 2016-05-18 DIAGNOSIS — Z7982 Long term (current) use of aspirin: Secondary | ICD-10-CM | POA: Diagnosis not present

## 2016-05-18 DIAGNOSIS — D62 Acute posthemorrhagic anemia: Secondary | ICD-10-CM | POA: Diagnosis not present

## 2016-05-18 DIAGNOSIS — M9711XD Periprosthetic fracture around internal prosthetic right knee joint, subsequent encounter: Secondary | ICD-10-CM | POA: Diagnosis not present

## 2016-05-18 DIAGNOSIS — W19XXXD Unspecified fall, subsequent encounter: Secondary | ICD-10-CM | POA: Diagnosis not present

## 2016-05-18 DIAGNOSIS — Z79891 Long term (current) use of opiate analgesic: Secondary | ICD-10-CM | POA: Diagnosis not present

## 2016-05-24 DIAGNOSIS — Z79891 Long term (current) use of opiate analgesic: Secondary | ICD-10-CM | POA: Diagnosis not present

## 2016-05-24 DIAGNOSIS — M9711XD Periprosthetic fracture around internal prosthetic right knee joint, subsequent encounter: Secondary | ICD-10-CM | POA: Diagnosis not present

## 2016-05-24 DIAGNOSIS — Z9181 History of falling: Secondary | ICD-10-CM | POA: Diagnosis not present

## 2016-05-24 DIAGNOSIS — D62 Acute posthemorrhagic anemia: Secondary | ICD-10-CM | POA: Diagnosis not present

## 2016-05-24 DIAGNOSIS — I10 Essential (primary) hypertension: Secondary | ICD-10-CM | POA: Diagnosis not present

## 2016-05-24 DIAGNOSIS — S72401D Unspecified fracture of lower end of right femur, subsequent encounter for closed fracture with routine healing: Secondary | ICD-10-CM | POA: Diagnosis not present

## 2016-05-24 DIAGNOSIS — Z7982 Long term (current) use of aspirin: Secondary | ICD-10-CM | POA: Diagnosis not present

## 2016-05-24 DIAGNOSIS — W19XXXD Unspecified fall, subsequent encounter: Secondary | ICD-10-CM | POA: Diagnosis not present

## 2016-05-24 DIAGNOSIS — Z96651 Presence of right artificial knee joint: Secondary | ICD-10-CM | POA: Diagnosis not present

## 2016-05-25 DIAGNOSIS — Z9181 History of falling: Secondary | ICD-10-CM | POA: Diagnosis not present

## 2016-05-25 DIAGNOSIS — M9711XD Periprosthetic fracture around internal prosthetic right knee joint, subsequent encounter: Secondary | ICD-10-CM | POA: Diagnosis not present

## 2016-05-25 DIAGNOSIS — I10 Essential (primary) hypertension: Secondary | ICD-10-CM | POA: Diagnosis not present

## 2016-05-25 DIAGNOSIS — Z96651 Presence of right artificial knee joint: Secondary | ICD-10-CM | POA: Diagnosis not present

## 2016-05-25 DIAGNOSIS — Z79891 Long term (current) use of opiate analgesic: Secondary | ICD-10-CM | POA: Diagnosis not present

## 2016-05-25 DIAGNOSIS — D62 Acute posthemorrhagic anemia: Secondary | ICD-10-CM | POA: Diagnosis not present

## 2016-05-25 DIAGNOSIS — W19XXXD Unspecified fall, subsequent encounter: Secondary | ICD-10-CM | POA: Diagnosis not present

## 2016-05-25 DIAGNOSIS — Z7982 Long term (current) use of aspirin: Secondary | ICD-10-CM | POA: Diagnosis not present

## 2016-05-25 DIAGNOSIS — S72401D Unspecified fracture of lower end of right femur, subsequent encounter for closed fracture with routine healing: Secondary | ICD-10-CM | POA: Diagnosis not present

## 2016-05-28 DIAGNOSIS — M978XXD Periprosthetic fracture around other internal prosthetic joint, subsequent encounter: Secondary | ICD-10-CM | POA: Diagnosis not present

## 2016-05-28 DIAGNOSIS — Z96641 Presence of right artificial hip joint: Secondary | ICD-10-CM | POA: Diagnosis not present

## 2016-05-31 DIAGNOSIS — Z79891 Long term (current) use of opiate analgesic: Secondary | ICD-10-CM | POA: Diagnosis not present

## 2016-05-31 DIAGNOSIS — Z96651 Presence of right artificial knee joint: Secondary | ICD-10-CM | POA: Diagnosis not present

## 2016-05-31 DIAGNOSIS — D62 Acute posthemorrhagic anemia: Secondary | ICD-10-CM | POA: Diagnosis not present

## 2016-05-31 DIAGNOSIS — S72401D Unspecified fracture of lower end of right femur, subsequent encounter for closed fracture with routine healing: Secondary | ICD-10-CM | POA: Diagnosis not present

## 2016-05-31 DIAGNOSIS — Z9181 History of falling: Secondary | ICD-10-CM | POA: Diagnosis not present

## 2016-05-31 DIAGNOSIS — Z7982 Long term (current) use of aspirin: Secondary | ICD-10-CM | POA: Diagnosis not present

## 2016-05-31 DIAGNOSIS — I10 Essential (primary) hypertension: Secondary | ICD-10-CM | POA: Diagnosis not present

## 2016-05-31 DIAGNOSIS — M9711XD Periprosthetic fracture around internal prosthetic right knee joint, subsequent encounter: Secondary | ICD-10-CM | POA: Diagnosis not present

## 2016-05-31 DIAGNOSIS — W19XXXD Unspecified fall, subsequent encounter: Secondary | ICD-10-CM | POA: Diagnosis not present

## 2016-06-07 DIAGNOSIS — Z7982 Long term (current) use of aspirin: Secondary | ICD-10-CM | POA: Diagnosis not present

## 2016-06-07 DIAGNOSIS — Z79891 Long term (current) use of opiate analgesic: Secondary | ICD-10-CM | POA: Diagnosis not present

## 2016-06-07 DIAGNOSIS — Z9181 History of falling: Secondary | ICD-10-CM | POA: Diagnosis not present

## 2016-06-07 DIAGNOSIS — W19XXXD Unspecified fall, subsequent encounter: Secondary | ICD-10-CM | POA: Diagnosis not present

## 2016-06-07 DIAGNOSIS — D62 Acute posthemorrhagic anemia: Secondary | ICD-10-CM | POA: Diagnosis not present

## 2016-06-07 DIAGNOSIS — M9711XD Periprosthetic fracture around internal prosthetic right knee joint, subsequent encounter: Secondary | ICD-10-CM | POA: Diagnosis not present

## 2016-06-07 DIAGNOSIS — Z96651 Presence of right artificial knee joint: Secondary | ICD-10-CM | POA: Diagnosis not present

## 2016-06-07 DIAGNOSIS — S72401D Unspecified fracture of lower end of right femur, subsequent encounter for closed fracture with routine healing: Secondary | ICD-10-CM | POA: Diagnosis not present

## 2016-06-07 DIAGNOSIS — I10 Essential (primary) hypertension: Secondary | ICD-10-CM | POA: Diagnosis not present

## 2016-06-14 DIAGNOSIS — Z96651 Presence of right artificial knee joint: Secondary | ICD-10-CM | POA: Diagnosis not present

## 2016-06-14 DIAGNOSIS — M9711XD Periprosthetic fracture around internal prosthetic right knee joint, subsequent encounter: Secondary | ICD-10-CM | POA: Diagnosis not present

## 2016-06-14 DIAGNOSIS — Z9181 History of falling: Secondary | ICD-10-CM | POA: Diagnosis not present

## 2016-06-14 DIAGNOSIS — W19XXXD Unspecified fall, subsequent encounter: Secondary | ICD-10-CM | POA: Diagnosis not present

## 2016-06-14 DIAGNOSIS — Z79891 Long term (current) use of opiate analgesic: Secondary | ICD-10-CM | POA: Diagnosis not present

## 2016-06-14 DIAGNOSIS — D62 Acute posthemorrhagic anemia: Secondary | ICD-10-CM | POA: Diagnosis not present

## 2016-06-14 DIAGNOSIS — Z7982 Long term (current) use of aspirin: Secondary | ICD-10-CM | POA: Diagnosis not present

## 2016-06-14 DIAGNOSIS — S72401D Unspecified fracture of lower end of right femur, subsequent encounter for closed fracture with routine healing: Secondary | ICD-10-CM | POA: Diagnosis not present

## 2016-06-14 DIAGNOSIS — I10 Essential (primary) hypertension: Secondary | ICD-10-CM | POA: Diagnosis not present

## 2016-06-16 DIAGNOSIS — Z96651 Presence of right artificial knee joint: Secondary | ICD-10-CM | POA: Diagnosis not present

## 2016-06-16 DIAGNOSIS — W19XXXD Unspecified fall, subsequent encounter: Secondary | ICD-10-CM | POA: Diagnosis not present

## 2016-06-16 DIAGNOSIS — M9711XD Periprosthetic fracture around internal prosthetic right knee joint, subsequent encounter: Secondary | ICD-10-CM | POA: Diagnosis not present

## 2016-06-16 DIAGNOSIS — I10 Essential (primary) hypertension: Secondary | ICD-10-CM | POA: Diagnosis not present

## 2016-06-16 DIAGNOSIS — Z7982 Long term (current) use of aspirin: Secondary | ICD-10-CM | POA: Diagnosis not present

## 2016-06-16 DIAGNOSIS — S72401D Unspecified fracture of lower end of right femur, subsequent encounter for closed fracture with routine healing: Secondary | ICD-10-CM | POA: Diagnosis not present

## 2016-06-16 DIAGNOSIS — Z9181 History of falling: Secondary | ICD-10-CM | POA: Diagnosis not present

## 2016-06-16 DIAGNOSIS — D62 Acute posthemorrhagic anemia: Secondary | ICD-10-CM | POA: Diagnosis not present

## 2016-06-16 DIAGNOSIS — Z79891 Long term (current) use of opiate analgesic: Secondary | ICD-10-CM | POA: Diagnosis not present

## 2016-06-21 DIAGNOSIS — M9711XD Periprosthetic fracture around internal prosthetic right knee joint, subsequent encounter: Secondary | ICD-10-CM | POA: Diagnosis not present

## 2016-06-21 DIAGNOSIS — S72401D Unspecified fracture of lower end of right femur, subsequent encounter for closed fracture with routine healing: Secondary | ICD-10-CM | POA: Diagnosis not present

## 2016-06-21 DIAGNOSIS — I10 Essential (primary) hypertension: Secondary | ICD-10-CM | POA: Diagnosis not present

## 2016-06-21 DIAGNOSIS — D62 Acute posthemorrhagic anemia: Secondary | ICD-10-CM | POA: Diagnosis not present

## 2016-06-21 DIAGNOSIS — Z96651 Presence of right artificial knee joint: Secondary | ICD-10-CM | POA: Diagnosis not present

## 2016-06-21 DIAGNOSIS — Z9181 History of falling: Secondary | ICD-10-CM | POA: Diagnosis not present

## 2016-06-21 DIAGNOSIS — Z7982 Long term (current) use of aspirin: Secondary | ICD-10-CM | POA: Diagnosis not present

## 2016-06-21 DIAGNOSIS — Z79891 Long term (current) use of opiate analgesic: Secondary | ICD-10-CM | POA: Diagnosis not present

## 2016-06-21 DIAGNOSIS — W19XXXD Unspecified fall, subsequent encounter: Secondary | ICD-10-CM | POA: Diagnosis not present

## 2016-07-09 DIAGNOSIS — M978XXD Periprosthetic fracture around other internal prosthetic joint, subsequent encounter: Secondary | ICD-10-CM | POA: Diagnosis not present

## 2016-07-09 DIAGNOSIS — Z96641 Presence of right artificial hip joint: Secondary | ICD-10-CM | POA: Diagnosis not present

## 2016-07-09 DIAGNOSIS — Z471 Aftercare following joint replacement surgery: Secondary | ICD-10-CM | POA: Diagnosis not present

## 2016-07-21 DIAGNOSIS — H8113 Benign paroxysmal vertigo, bilateral: Secondary | ICD-10-CM | POA: Diagnosis not present

## 2016-08-05 ENCOUNTER — Other Ambulatory Visit: Payer: Self-pay | Admitting: Nurse Practitioner

## 2016-09-09 DIAGNOSIS — Z4789 Encounter for other orthopedic aftercare: Secondary | ICD-10-CM | POA: Diagnosis not present

## 2016-09-09 DIAGNOSIS — S72491D Other fracture of lower end of right femur, subsequent encounter for closed fracture with routine healing: Secondary | ICD-10-CM | POA: Diagnosis not present

## 2016-09-22 DIAGNOSIS — E7800 Pure hypercholesterolemia, unspecified: Secondary | ICD-10-CM | POA: Diagnosis not present

## 2016-09-22 DIAGNOSIS — H8113 Benign paroxysmal vertigo, bilateral: Secondary | ICD-10-CM | POA: Diagnosis not present

## 2016-09-22 DIAGNOSIS — I10 Essential (primary) hypertension: Secondary | ICD-10-CM | POA: Diagnosis not present

## 2016-09-22 DIAGNOSIS — M159 Polyosteoarthritis, unspecified: Secondary | ICD-10-CM | POA: Diagnosis not present

## 2016-09-22 DIAGNOSIS — K219 Gastro-esophageal reflux disease without esophagitis: Secondary | ICD-10-CM | POA: Diagnosis not present

## 2016-09-22 DIAGNOSIS — N3281 Overactive bladder: Secondary | ICD-10-CM | POA: Diagnosis not present

## 2016-10-22 DIAGNOSIS — Z4789 Encounter for other orthopedic aftercare: Secondary | ICD-10-CM | POA: Diagnosis not present

## 2016-10-22 DIAGNOSIS — Z96641 Presence of right artificial hip joint: Secondary | ICD-10-CM | POA: Diagnosis not present

## 2016-10-22 DIAGNOSIS — M978XXD Periprosthetic fracture around other internal prosthetic joint, subsequent encounter: Secondary | ICD-10-CM | POA: Diagnosis not present

## 2016-10-22 DIAGNOSIS — S72491D Other fracture of lower end of right femur, subsequent encounter for closed fracture with routine healing: Secondary | ICD-10-CM | POA: Diagnosis not present

## 2017-03-22 DIAGNOSIS — J069 Acute upper respiratory infection, unspecified: Secondary | ICD-10-CM | POA: Diagnosis not present

## 2017-03-22 DIAGNOSIS — Z1389 Encounter for screening for other disorder: Secondary | ICD-10-CM | POA: Diagnosis not present

## 2017-03-22 DIAGNOSIS — E7800 Pure hypercholesterolemia, unspecified: Secondary | ICD-10-CM | POA: Diagnosis not present

## 2017-03-22 DIAGNOSIS — N3281 Overactive bladder: Secondary | ICD-10-CM | POA: Diagnosis not present

## 2017-03-22 DIAGNOSIS — I10 Essential (primary) hypertension: Secondary | ICD-10-CM | POA: Diagnosis not present

## 2017-03-22 DIAGNOSIS — K219 Gastro-esophageal reflux disease without esophagitis: Secondary | ICD-10-CM | POA: Diagnosis not present

## 2017-05-09 DIAGNOSIS — I5023 Acute on chronic systolic (congestive) heart failure: Secondary | ICD-10-CM | POA: Diagnosis not present

## 2017-05-09 DIAGNOSIS — I2510 Atherosclerotic heart disease of native coronary artery without angina pectoris: Secondary | ICD-10-CM | POA: Diagnosis not present

## 2017-05-09 DIAGNOSIS — M810 Age-related osteoporosis without current pathological fracture: Secondary | ICD-10-CM | POA: Diagnosis not present

## 2017-05-09 DIAGNOSIS — H3530 Unspecified macular degeneration: Secondary | ICD-10-CM | POA: Diagnosis not present

## 2017-05-09 DIAGNOSIS — Z9981 Dependence on supplemental oxygen: Secondary | ICD-10-CM | POA: Diagnosis not present

## 2017-05-09 DIAGNOSIS — F0280 Dementia in other diseases classified elsewhere without behavioral disturbance: Secondary | ICD-10-CM | POA: Diagnosis not present

## 2017-05-09 DIAGNOSIS — J9621 Acute and chronic respiratory failure with hypoxia: Secondary | ICD-10-CM | POA: Diagnosis not present

## 2017-05-09 DIAGNOSIS — E1122 Type 2 diabetes mellitus with diabetic chronic kidney disease: Secondary | ICD-10-CM | POA: Diagnosis not present

## 2017-05-09 DIAGNOSIS — I482 Chronic atrial fibrillation: Secondary | ICD-10-CM | POA: Diagnosis not present

## 2017-05-09 DIAGNOSIS — G4733 Obstructive sleep apnea (adult) (pediatric): Secondary | ICD-10-CM | POA: Diagnosis not present

## 2017-05-09 DIAGNOSIS — J449 Chronic obstructive pulmonary disease, unspecified: Secondary | ICD-10-CM | POA: Diagnosis not present

## 2017-05-09 DIAGNOSIS — S32020D Wedge compression fracture of second lumbar vertebra, subsequent encounter for fracture with routine healing: Secondary | ICD-10-CM | POA: Diagnosis not present

## 2017-05-09 DIAGNOSIS — G309 Alzheimer's disease, unspecified: Secondary | ICD-10-CM | POA: Diagnosis not present

## 2017-05-09 DIAGNOSIS — N184 Chronic kidney disease, stage 4 (severe): Secondary | ICD-10-CM | POA: Diagnosis not present

## 2017-05-09 DIAGNOSIS — R1312 Dysphagia, oropharyngeal phase: Secondary | ICD-10-CM | POA: Diagnosis not present

## 2017-05-09 DIAGNOSIS — I130 Hypertensive heart and chronic kidney disease with heart failure and stage 1 through stage 4 chronic kidney disease, or unspecified chronic kidney disease: Secondary | ICD-10-CM | POA: Diagnosis not present

## 2017-05-09 DIAGNOSIS — E43 Unspecified severe protein-calorie malnutrition: Secondary | ICD-10-CM | POA: Diagnosis not present

## 2017-05-09 DIAGNOSIS — Z95810 Presence of automatic (implantable) cardiac defibrillator: Secondary | ICD-10-CM | POA: Diagnosis not present

## 2017-09-28 DIAGNOSIS — K219 Gastro-esophageal reflux disease without esophagitis: Secondary | ICD-10-CM | POA: Diagnosis not present

## 2017-09-28 DIAGNOSIS — E7800 Pure hypercholesterolemia, unspecified: Secondary | ICD-10-CM | POA: Diagnosis not present

## 2017-09-28 DIAGNOSIS — N3281 Overactive bladder: Secondary | ICD-10-CM | POA: Diagnosis not present

## 2017-09-28 DIAGNOSIS — Z23 Encounter for immunization: Secondary | ICD-10-CM | POA: Diagnosis not present

## 2017-09-28 DIAGNOSIS — I10 Essential (primary) hypertension: Secondary | ICD-10-CM | POA: Diagnosis not present

## 2017-12-15 DIAGNOSIS — M8588 Other specified disorders of bone density and structure, other site: Secondary | ICD-10-CM | POA: Diagnosis not present

## 2017-12-23 DIAGNOSIS — R946 Abnormal results of thyroid function studies: Secondary | ICD-10-CM | POA: Diagnosis not present

## 2018-03-29 DIAGNOSIS — I10 Essential (primary) hypertension: Secondary | ICD-10-CM | POA: Diagnosis not present

## 2018-03-29 DIAGNOSIS — K219 Gastro-esophageal reflux disease without esophagitis: Secondary | ICD-10-CM | POA: Diagnosis not present

## 2018-03-29 DIAGNOSIS — M85852 Other specified disorders of bone density and structure, left thigh: Secondary | ICD-10-CM | POA: Diagnosis not present

## 2018-03-29 DIAGNOSIS — N3281 Overactive bladder: Secondary | ICD-10-CM | POA: Diagnosis not present

## 2018-03-29 DIAGNOSIS — E7800 Pure hypercholesterolemia, unspecified: Secondary | ICD-10-CM | POA: Diagnosis not present

## 2018-10-02 DIAGNOSIS — N3281 Overactive bladder: Secondary | ICD-10-CM | POA: Diagnosis not present

## 2018-10-02 DIAGNOSIS — M85852 Other specified disorders of bone density and structure, left thigh: Secondary | ICD-10-CM | POA: Diagnosis not present

## 2018-10-02 DIAGNOSIS — E7800 Pure hypercholesterolemia, unspecified: Secondary | ICD-10-CM | POA: Diagnosis not present

## 2018-10-02 DIAGNOSIS — Z0000 Encounter for general adult medical examination without abnormal findings: Secondary | ICD-10-CM | POA: Diagnosis not present

## 2018-10-02 DIAGNOSIS — I10 Essential (primary) hypertension: Secondary | ICD-10-CM | POA: Diagnosis not present

## 2018-10-02 DIAGNOSIS — Z23 Encounter for immunization: Secondary | ICD-10-CM | POA: Diagnosis not present

## 2018-10-02 DIAGNOSIS — K219 Gastro-esophageal reflux disease without esophagitis: Secondary | ICD-10-CM | POA: Diagnosis not present

## 2019-01-23 DIAGNOSIS — B349 Viral infection, unspecified: Secondary | ICD-10-CM | POA: Diagnosis not present

## 2019-01-25 ENCOUNTER — Emergency Department (HOSPITAL_COMMUNITY): Payer: PPO

## 2019-01-25 ENCOUNTER — Inpatient Hospital Stay (HOSPITAL_COMMUNITY)
Admission: EM | Admit: 2019-01-25 | Discharge: 2019-01-31 | DRG: 871 | Disposition: A | Discharge status: Home Health | Payer: PPO | Attending: Internal Medicine | Admitting: Internal Medicine

## 2019-01-25 ENCOUNTER — Encounter (HOSPITAL_COMMUNITY): Payer: Self-pay | Admitting: *Deleted

## 2019-01-25 ENCOUNTER — Other Ambulatory Visit: Payer: Self-pay

## 2019-01-25 DIAGNOSIS — J181 Lobar pneumonia, unspecified organism: Secondary | ICD-10-CM

## 2019-01-25 DIAGNOSIS — E860 Dehydration: Secondary | ICD-10-CM | POA: Diagnosis not present

## 2019-01-25 DIAGNOSIS — A4189 Other specified sepsis: Secondary | ICD-10-CM

## 2019-01-25 DIAGNOSIS — J90 Pleural effusion, not elsewhere classified: Secondary | ICD-10-CM | POA: Diagnosis not present

## 2019-01-25 DIAGNOSIS — N3281 Overactive bladder: Secondary | ICD-10-CM | POA: Diagnosis present

## 2019-01-25 DIAGNOSIS — E861 Hypovolemia: Secondary | ICD-10-CM | POA: Diagnosis not present

## 2019-01-25 DIAGNOSIS — R059 Cough, unspecified: Secondary | ICD-10-CM

## 2019-01-25 DIAGNOSIS — E871 Hypo-osmolality and hyponatremia: Secondary | ICD-10-CM | POA: Diagnosis not present

## 2019-01-25 DIAGNOSIS — Z8249 Family history of ischemic heart disease and other diseases of the circulatory system: Secondary | ICD-10-CM | POA: Diagnosis not present

## 2019-01-25 DIAGNOSIS — Z811 Family history of alcohol abuse and dependence: Secondary | ICD-10-CM | POA: Diagnosis not present

## 2019-01-25 DIAGNOSIS — N179 Acute kidney failure, unspecified: Secondary | ICD-10-CM

## 2019-01-25 DIAGNOSIS — J9601 Acute respiratory failure with hypoxia: Secondary | ICD-10-CM

## 2019-01-25 DIAGNOSIS — K7689 Other specified diseases of liver: Secondary | ICD-10-CM | POA: Diagnosis not present

## 2019-01-25 DIAGNOSIS — Z79899 Other long term (current) drug therapy: Secondary | ICD-10-CM

## 2019-01-25 DIAGNOSIS — R0600 Dyspnea, unspecified: Secondary | ICD-10-CM | POA: Diagnosis not present

## 2019-01-25 DIAGNOSIS — Z79891 Long term (current) use of opiate analgesic: Secondary | ICD-10-CM | POA: Diagnosis not present

## 2019-01-25 DIAGNOSIS — I129 Hypertensive chronic kidney disease with stage 1 through stage 4 chronic kidney disease, or unspecified chronic kidney disease: Secondary | ICD-10-CM | POA: Diagnosis present

## 2019-01-25 DIAGNOSIS — N183 Chronic kidney disease, stage 3 (moderate): Secondary | ICD-10-CM | POA: Diagnosis present

## 2019-01-25 DIAGNOSIS — R6520 Severe sepsis without septic shock: Secondary | ICD-10-CM | POA: Diagnosis not present

## 2019-01-25 DIAGNOSIS — R945 Abnormal results of liver function studies: Secondary | ICD-10-CM | POA: Diagnosis not present

## 2019-01-25 DIAGNOSIS — I480 Paroxysmal atrial fibrillation: Secondary | ICD-10-CM

## 2019-01-25 DIAGNOSIS — A481 Legionnaires' disease: Secondary | ICD-10-CM | POA: Diagnosis present

## 2019-01-25 DIAGNOSIS — A4159 Other Gram-negative sepsis: Principal | ICD-10-CM | POA: Diagnosis present

## 2019-01-25 DIAGNOSIS — A419 Sepsis, unspecified organism: Secondary | ICD-10-CM | POA: Diagnosis not present

## 2019-01-25 DIAGNOSIS — I4892 Unspecified atrial flutter: Secondary | ICD-10-CM | POA: Diagnosis not present

## 2019-01-25 DIAGNOSIS — R6889 Other general symptoms and signs: Secondary | ICD-10-CM | POA: Diagnosis not present

## 2019-01-25 DIAGNOSIS — Z841 Family history of disorders of kidney and ureter: Secondary | ICD-10-CM | POA: Diagnosis not present

## 2019-01-25 DIAGNOSIS — E872 Acidosis: Secondary | ICD-10-CM | POA: Diagnosis not present

## 2019-01-25 DIAGNOSIS — I4819 Other persistent atrial fibrillation: Secondary | ICD-10-CM | POA: Diagnosis not present

## 2019-01-25 DIAGNOSIS — D631 Anemia in chronic kidney disease: Secondary | ICD-10-CM | POA: Diagnosis present

## 2019-01-25 DIAGNOSIS — Z96651 Presence of right artificial knee joint: Secondary | ICD-10-CM | POA: Diagnosis not present

## 2019-01-25 DIAGNOSIS — Z20828 Contact with and (suspected) exposure to other viral communicable diseases: Secondary | ICD-10-CM | POA: Diagnosis present

## 2019-01-25 DIAGNOSIS — R05 Cough: Secondary | ICD-10-CM | POA: Diagnosis not present

## 2019-01-25 DIAGNOSIS — Z800 Family history of malignant neoplasm of digestive organs: Secondary | ICD-10-CM

## 2019-01-25 DIAGNOSIS — Z801 Family history of malignant neoplasm of trachea, bronchus and lung: Secondary | ICD-10-CM | POA: Diagnosis not present

## 2019-01-25 DIAGNOSIS — J129 Viral pneumonia, unspecified: Secondary | ICD-10-CM | POA: Diagnosis not present

## 2019-01-25 DIAGNOSIS — R7989 Other specified abnormal findings of blood chemistry: Secondary | ICD-10-CM

## 2019-01-25 DIAGNOSIS — Z20822 Contact with and (suspected) exposure to covid-19: Secondary | ICD-10-CM

## 2019-01-25 DIAGNOSIS — R509 Fever, unspecified: Secondary | ICD-10-CM | POA: Diagnosis not present

## 2019-01-25 LAB — CBC WITH DIFFERENTIAL/PLATELET
Abs Immature Granulocytes: 0.11 10*3/uL — ABNORMAL HIGH (ref 0.00–0.07)
Band Neutrophils: 0 %
Basophils Absolute: 0 10*3/uL (ref 0.0–0.1)
Basophils Absolute: 0 10*3/uL (ref 0.0–0.1)
Basophils Relative: 0 %
Basophils Relative: 0 %
Blasts: 0 %
Eosinophils Absolute: 0 10*3/uL (ref 0.0–0.5)
Eosinophils Absolute: 0 10*3/uL (ref 0.0–0.5)
Eosinophils Relative: 0 %
Eosinophils Relative: 0 %
HCT: 35.2 % — ABNORMAL LOW (ref 36.0–46.0)
HCT: 33 % — ABNORMAL LOW (ref 36.0–46.0)
Hemoglobin: 11.5 g/dL — ABNORMAL LOW (ref 12.0–15.0)
Hemoglobin: 11 g/dL — ABNORMAL LOW (ref 12.0–15.0)
Immature Granulocytes: 1 %
Lymphocytes Relative: 7 %
Lymphocytes Relative: 5 %
Lymphs Abs: 0.7 10*3/uL (ref 0.7–4.0)
Lymphs Abs: 0.4 10*3/uL — ABNORMAL LOW (ref 0.7–4.0)
MCH: 30.7 pg (ref 26.0–34.0)
MCH: 30.8 pg (ref 26.0–34.0)
MCHC: 32.7 g/dL (ref 30.0–36.0)
MCHC: 33.3 g/dL (ref 30.0–36.0)
MCV: 93.9 fL (ref 80.0–100.0)
MCV: 92.4 fL (ref 80.0–100.0)
Metamyelocytes Relative: 2 %
Monocytes Absolute: 0 10*3/uL — ABNORMAL LOW (ref 0.1–1.0)
Monocytes Absolute: 0.2 10*3/uL (ref 0.1–1.0)
Monocytes Relative: 0 %
Monocytes Relative: 3 %
Myelocytes: 0 %
Neutro Abs: 9.2 10*3/uL — ABNORMAL HIGH (ref 1.7–7.7)
Neutro Abs: 7.7 10*3/uL (ref 1.7–7.7)
Neutrophils Relative %: 91 %
Neutrophils Relative %: 91 %
Other: 0 %
Platelets: 154 10*3/uL (ref 150–400)
Platelets: 143 10*3/uL — ABNORMAL LOW (ref 150–400)
Promyelocytes Relative: 0 %
RBC: 3.75 MIL/uL — ABNORMAL LOW (ref 3.87–5.11)
RBC: 3.57 MIL/uL — ABNORMAL LOW (ref 3.87–5.11)
RDW: 14.1 % (ref 11.5–15.5)
RDW: 14.1 % (ref 11.5–15.5)
WBC: 9.9 10*3/uL (ref 4.0–10.5)
WBC: 8.5 10*3/uL (ref 4.0–10.5)
nRBC: 0 % (ref 0.0–0.2)
nRBC: 0 /100 WBC
nRBC: 0 % (ref 0.0–0.2)

## 2019-01-25 LAB — COMPREHENSIVE METABOLIC PANEL
ALT: 42 U/L (ref 0–44)
AST: 63 U/L — ABNORMAL HIGH (ref 15–41)
Albumin: 2.6 g/dL — ABNORMAL LOW (ref 3.5–5.0)
Alkaline Phosphatase: 83 U/L (ref 38–126)
Anion gap: 11 (ref 5–15)
BUN: 39 mg/dL — ABNORMAL HIGH (ref 8–23)
CO2: 20 mmol/L — ABNORMAL LOW (ref 22–32)
Calcium: 8.4 mg/dL — ABNORMAL LOW (ref 8.9–10.3)
Chloride: 94 mmol/L — ABNORMAL LOW (ref 98–111)
Creatinine, Ser: 2.02 mg/dL — ABNORMAL HIGH (ref 0.44–1.00)
GFR calc Af Amer: 26 mL/min — ABNORMAL LOW (ref >60)
GFR calc non Af Amer: 22 mL/min — ABNORMAL LOW (ref >60)
Glucose, Bld: 123 mg/dL — ABNORMAL HIGH (ref 70–99)
Potassium: 3.8 mmol/L (ref 3.5–5.1)
Sodium: 125 mmol/L — ABNORMAL LOW (ref 135–145)
Total Bilirubin: 1 mg/dL (ref 0.3–1.2)
Total Protein: 6.3 g/dL — ABNORMAL LOW (ref 6.5–8.1)

## 2019-01-25 LAB — BASIC METABOLIC PANEL
Anion gap: 10 (ref 5–15)
BUN: 38 mg/dL — ABNORMAL HIGH (ref 8–23)
CO2: 19 mmol/L — ABNORMAL LOW (ref 22–32)
Calcium: 7.8 mg/dL — ABNORMAL LOW (ref 8.9–10.3)
Chloride: 99 mmol/L (ref 98–111)
Creatinine, Ser: 1.66 mg/dL — ABNORMAL HIGH (ref 0.44–1.00)
GFR calc Af Amer: 33 mL/min — ABNORMAL LOW (ref >60)
GFR calc non Af Amer: 28 mL/min — ABNORMAL LOW (ref >60)
Glucose, Bld: 109 mg/dL — ABNORMAL HIGH (ref 70–99)
Potassium: 4 mmol/L (ref 3.5–5.1)
Sodium: 128 mmol/L — ABNORMAL LOW (ref 135–145)

## 2019-01-25 LAB — URINALYSIS, ROUTINE W REFLEX MICROSCOPIC
Bilirubin Urine: NEGATIVE
Glucose, UA: NEGATIVE mg/dL
Ketones, ur: NEGATIVE mg/dL
Nitrite: NEGATIVE
Protein, ur: 30 mg/dL — AB
Specific Gravity, Urine: 1.018 (ref 1.005–1.030)
pH: 5 (ref 5.0–8.0)

## 2019-01-25 LAB — LACTIC ACID, PLASMA
Lactic Acid, Venous: 2.1 mmol/L (ref 0.5–1.9)
Lactic Acid, Venous: 2.4 mmol/L (ref 0.5–1.9)

## 2019-01-25 LAB — SARS CORONAVIRUS 2 BY RT PCR (HOSPITAL ORDER, PERFORMED IN ~~LOC~~ HOSPITAL LAB): SARS Coronavirus 2: NEGATIVE

## 2019-01-25 LAB — MRSA PCR SCREENING: MRSA by PCR: NEGATIVE

## 2019-01-25 MED ORDER — SODIUM CHLORIDE 0.9 % IV SOLN: INTRAVENOUS | Status: DC

## 2019-01-25 MED ORDER — ONDANSETRON HCL 4 MG/2ML IJ SOLN: 4.0000 mg | Freq: Four times a day (QID) | INTRAMUSCULAR | Status: DC | PRN

## 2019-01-25 MED ORDER — ACETAMINOPHEN 325 MG PO TABS
650.0000 mg | ORAL_TABLET | Freq: Four times a day (QID) | ORAL | Status: DC | PRN
  Administered 2019-01-26 – 2019-01-29 (×7): 650 mg via ORAL
  Filled 2019-01-25 (×7): qty 2

## 2019-01-25 MED ORDER — DM-GUAIFENESIN ER 30-600 MG PO TB12
2.0000 | ORAL_TABLET | Freq: Two times a day (BID) | ORAL | Status: DC
  Administered 2019-01-25 – 2019-01-26 (×2): 2 via ORAL
  Filled 2019-01-25 (×2): qty 2

## 2019-01-25 MED ORDER — OXYBUTYNIN CHLORIDE 5 MG PO TABS
5.0000 mg | ORAL_TABLET | Freq: Every day | ORAL | Status: DC
  Administered 2019-01-25 – 2019-01-30 (×6): 5 mg via ORAL
  Filled 2019-01-25 (×6): qty 1

## 2019-01-25 MED ORDER — METOCLOPRAMIDE HCL 5 MG/ML IJ SOLN
5.0000 mg | Freq: Once | INTRAMUSCULAR | Status: AC
  Administered 2019-01-25: 5 mg via INTRAVENOUS
  Filled 2019-01-25: qty 2

## 2019-01-25 MED ORDER — ALBUTEROL SULFATE (2.5 MG/3ML) 0.083% IN NEBU: 3.0000 mL | INHALATION_SOLUTION | Freq: Four times a day (QID) | RESPIRATORY_TRACT | Status: DC

## 2019-01-25 MED ORDER — ACETAMINOPHEN 500 MG PO TABS
1000.0000 mg | ORAL_TABLET | Freq: Once | ORAL | Status: AC
  Administered 2019-01-25: 1000 mg via ORAL
  Filled 2019-01-25: qty 2

## 2019-01-25 MED ORDER — SODIUM CHLORIDE 0.9 % IV SOLN
2.0000 g | INTRAVENOUS | Status: DC
  Administered 2019-01-26 – 2019-01-27 (×2): 2 g via INTRAVENOUS
  Filled 2019-01-25 (×5): qty 2

## 2019-01-25 MED ORDER — SODIUM CHLORIDE 0.9 % IV BOLUS
500.0000 mL | Freq: Once | INTRAVENOUS | Status: AC
  Administered 2019-01-25: 500 mL via INTRAVENOUS

## 2019-01-25 MED ORDER — KETOROLAC TROMETHAMINE 15 MG/ML IJ SOLN
15.0000 mg | Freq: Once | INTRAMUSCULAR | Status: AC
  Administered 2019-01-25: 15 mg via INTRAVENOUS
  Filled 2019-01-25: qty 1

## 2019-01-25 MED ORDER — METRONIDAZOLE IN NACL 5-0.79 MG/ML-% IV SOLN
500.0000 mg | Freq: Once | INTRAVENOUS | Status: AC
  Administered 2019-01-25: 500 mg via INTRAVENOUS
  Filled 2019-01-25: qty 100

## 2019-01-25 MED ORDER — SODIUM CHLORIDE 0.9 % IV SOLN
2.0000 g | Freq: Once | INTRAVENOUS | Status: AC
  Administered 2019-01-25: 2 g via INTRAVENOUS
  Filled 2019-01-25: qty 2

## 2019-01-25 MED ORDER — VANCOMYCIN HCL 10 G IV SOLR: 1250.0000 mg | INTRAVENOUS | Status: DC

## 2019-01-25 MED ORDER — VANCOMYCIN HCL IN DEXTROSE 1-5 GM/200ML-% IV SOLN: 1000.0000 mg | Freq: Once | INTRAVENOUS | Status: DC

## 2019-01-25 MED ORDER — VANCOMYCIN HCL 10 G IV SOLR
1500.0000 mg | Freq: Once | INTRAVENOUS | Status: AC
  Administered 2019-01-25: 1500 mg via INTRAVENOUS
  Filled 2019-01-25: qty 1500

## 2019-01-25 MED ORDER — ENOXAPARIN SODIUM 30 MG/0.3ML ~~LOC~~ SOLN
30.0000 mg | SUBCUTANEOUS | Status: DC
  Administered 2019-01-25: 30 mg via SUBCUTANEOUS
  Filled 2019-01-25: qty 0.3

## 2019-01-25 MED ORDER — SODIUM CHLORIDE 0.9 % IV SOLN
INTRAVENOUS | Status: AC
  Administered 2019-01-25 – 2019-01-27 (×4): via INTRAVENOUS

## 2019-01-25 MED ORDER — DIPHENHYDRAMINE HCL 50 MG/ML IJ SOLN
25.0000 mg | Freq: Once | INTRAMUSCULAR | Status: AC
  Administered 2019-01-25: 25 mg via INTRAVENOUS
  Filled 2019-01-25: qty 1

## 2019-01-25 MED ORDER — ALBUTEROL SULFATE HFA 108 (90 BASE) MCG/ACT IN AERS
2.0000 | INHALATION_SPRAY | Freq: Four times a day (QID) | RESPIRATORY_TRACT | Status: DC
  Administered 2019-01-25 – 2019-01-29 (×13): 2 via RESPIRATORY_TRACT
  Filled 2019-01-25 (×2): qty 6.7

## 2019-01-25 MED ORDER — FAMOTIDINE 20 MG PO TABS
20.0000 mg | ORAL_TABLET | Freq: Every day | ORAL | Status: DC
  Administered 2019-01-26 – 2019-01-31 (×6): 20 mg via ORAL
  Filled 2019-01-25 (×6): qty 1

## 2019-01-25 MED ORDER — SODIUM CHLORIDE 0.9 % IV BOLUS
1000.0000 mL | Freq: Once | INTRAVENOUS | Status: AC
  Administered 2019-01-25: 16:00:00 1000 mL via INTRAVENOUS

## 2019-01-25 NOTE — ED Notes (Signed)
Report given to [place], [place].  Pt to be transported around 1930

## 2019-01-25 NOTE — ED Provider Notes (Signed)
I received pt in signout from Dr. Stark Jock. Pt presented w/ fever, headache along with cough and SOB. Febrile on arrival. At time of signout, pt getting IVF bolus for AKI and hyponatremia, lactate borderline at 2.1. High suspicion for COVID-19 based on story therefore held off on antibiotics/code sepsis for now. Pending COVID results, CXR, UA, and admission at time of signout.  COVID test negative, therefore initiated code sepsis w/ broad antibiotics, Vanc/Cefepime/Flagyl. CXR negative acute. UA is still pending. Because of strong suspicion for COVID-19, recommended continuing precautions for now. Discussed admission w/ [facility], Dr. Nevada Crane, and pt admitted for further w/u and treatment.  CRITICAL CARE Performed by: Wenda Overland Fidencio Duddy   Total critical care time: 30 minutes  Critical care time was exclusive of separately billable procedures and treating other patients.  Critical care was necessary to treat or prevent imminent or life-threatening deterioration.  Critical care was time spent personally by me on the following activities: development of treatment plan with patient and/or surrogate as well as nursing, discussions with consultants, evaluation of patient's response to treatment, examination of patient, obtaining history from patient or surrogate, ordering and performing treatments and interventions, ordering and review of laboratory studies, ordering and review of radiographic studies, pulse oximetry and re-evaluation of patient's condition.   Alpha C Farewell was evaluated in Emergency Department on 01/25/2019 for the symptoms described in the history of present illness. She was evaluated in the context of the global COVID-19 pandemic, which necessitated consideration that the patient might be at risk for infection with the SARS-CoV-2 virus that causes COVID-19. Institutional protocols and algorithms that pertain to the evaluation of patients at risk for COVID-19 are in a state of rapid change based  on information released by regulatory bodies including the CDC and federal and state organizations. These policies and algorithms were followed during the patient's care in the ED.    Gorge Almanza, Wenda Overland, MD 01/25/19 [phone]

## 2019-01-25 NOTE — ED Notes (Signed)
Attempted to call report.  Per Joellen Jersey, RN they are trying to figure out the assignment.  Will attempt to call again

## 2019-01-25 NOTE — ED Triage Notes (Signed)
Pt reports fever, headache, right arm tremors since Sunday.

## 2019-01-25 NOTE — ED Notes (Signed)
2nd attempt to call report.  Spoke to [employer], Therapist, sports.  Staffing issue on the unit.  Will be notified when pt able to transport

## 2019-01-25 NOTE — Progress Notes (Signed)
Pharmacy Antibiotic Note  Amanda Berry is a 85 y.o. female admitted on 01/25/2019 with sepsis.  Pharmacy has been consulted for vancomycin/cefepime dosing. Tmax 102.9. WBC 9.9, LA 2.1>2.4. Scr 2.02 (baseline 0.6-0.7).  Vancomycin 1250 mg IV Q48hrs. Goal AUC 400-550. Expected AUC: 48 SCr used: 2.02   Plan: Vancomycin 1500 mg IV x1 then vancomycin 1250 mg IV q48h Cefepime 2g IV q24h Monitor clinical status, renal function, cultures, and length of therapy Monitor vancomycin levels as indicated  Temp (24hrs), Avg:103 F (39.4 C), Min:102.9 F (39.4 C), Max:103.1 F (39.5 C)  Recent Labs  Lab 01/25/19 1411 01/25/19 1626  WBC 9.9  --   CREATININE 2.02*  --   LATICACIDVEN 2.1* 2.4*    Estimated Creatinine Clearance: 18.8 mL/min (A) (by C-G formula based on SCr of 2.02 mg/dL (H)).    No Known Allergies  Antimicrobials this admission: Cefepime 5/28 >> Vancomycin 5/28 >> Flagyl 5/28 x1  Dose adjustments this admission:   Microbiology results: 5/28 BCx: 5/28: covid negative   Thank you for allowing pharmacy to be a part of this patient's care.   Claiborne Billings, PharmD PGY2 Cardiology Pharmacy Resident Please check AMION for all Pharmacist numbers by unit 01/25/2019 5:25 PM

## 2019-01-25 NOTE — ED Provider Notes (Signed)
[facility] EMERGENCY DEPARTMENT Provider Note   CSN: [id number] Arrival date & time: 01/25/19  1334    History   Chief Complaint Chief Complaint  Patient presents with  . Fever  . Headache    HPI Amanda Berry is a 85 y.o. female.     Patient is an 85 year old female with past medical history of hypertension, anemia, prior femur fracture.  She presents today for evaluation of fever.  She describes headache that started this past Sunday along with fevers up to 102.  This is been occurring intermittently since.  Patient does describe occasional cough and shortness of breath.  She apparently had a COVID test obtained, but has not been informed of the results.  She denies bowel or bladder complaints.  She denies any ill contacts, but did attend church this past Sunday.  The history is provided by the patient.  Fever  Max temp prior to arrival:  102 Temp source:  Oral Severity:  Moderate Onset quality:  Sudden Duration:  4 days Timing:  Intermittent Progression:  Worsening Chronicity:  New Relieved by:  Nothing Worsened by:  Nothing Ineffective treatments:  Acetaminophen Associated symptoms: chills   Associated symptoms: no dysuria     Past Medical History:  Diagnosis Date  . Constipation   . Fracture of femur, right, closed ([place]) 2017  . Hypertension   . Overactive bladder   . Symptomatic anemia 03/2016    Patient Active Problem List   Diagnosis Date Noted  . Symptomatic anemia 04/19/2016  . Hypertension 04/19/2016  . Constipation 04/19/2016  . Overactive bladder 04/19/2016  . Femur fracture, right, closed, initial encounter 04/11/2016    Past Surgical History:  Procedure Laterality Date  . KNEE ARTHROPLASTY Right    approx 10 years ago  . ORIF PERIPROSTHETIC FRACTURE Right 04/12/2016   Procedure: OPEN REDUCTION INTERNAL FIXATION (ORIF) RIGHT DISTAL FEMUR  PERIPROSTHETIC FRACTURE;  Surgeon: Paralee Cancel, MD;  Location: [facility] ORS;  Service:  Orthopedics;  Laterality: Right;  . ROTATOR CUFF REPAIR Right 1990's     OB History   No obstetric history on file.      Home Medications    Prior to Admission medications   Medication Sig Start Date End Date Taking? Authorizing Provider  docusate sodium (COLACE) 100 MG capsule Take 1 capsule (100 mg total) by mouth 2 (two) times daily. 04/15/16   Danae Orleans, PA-C  HYDROcodone-acetaminophen (NORCO/VICODIN) 5-325 MG tablet Take 1-2 tablets by mouth every 4 (four) hours as needed (for pain). 04/20/16   Verlee Monte, MD  lisinopril (PRINIVIL,ZESTRIL) 20 MG tablet Take 20 mg by mouth daily.  01/15/16   [provider]  oxybutynin (DITROPAN) 5 MG tablet Take 5 mg by mouth at bedtime.  03/23/16   [provider]  polyethylene glycol (MIRALAX / GLYCOLAX) packet Take 17 g by mouth 2 (two) times daily. 04/15/16   Danae Orleans, PA-C  Protein (PROCEL) POWD Take 2 scoop by mouth 2 (two) times daily.    [provider]  ranitidine (ZANTAC) 300 MG tablet Take 300 mg by mouth at bedtime.  02/17/16   [provider]  Red Yeast Rice 600 MG CAPS Take 1 capsule by mouth daily.    [provider]  tiZANidine (ZANAFLEX) 4 MG tablet Take 1 tablet (4 mg total) by mouth every 6 (six) hours as needed for muscle spasms. 04/15/16   Danae Orleans, PA-C    Family History Family History  Problem Relation Age of Onset  .  Hypertension Mother   . Colon cancer Father   . Valvular heart disease Sister   . Kidney disease Brother   . Alcoholism Brother   . Lung cancer Sister     Social History Social History   Tobacco Use  . Smoking status: Never Smoker  . Smokeless tobacco: Never Used  Substance Use Topics  . Alcohol use: No  . Drug use: No     Allergies   Patient has no known allergies.   Review of Systems Review of Systems  Constitutional: Positive for chills.  Genitourinary: Negative for dysuria.  All other systems reviewed and are negative.     Physical Exam Updated Vital Signs BP (!) 141/51 (BP Location: Right Arm)   Pulse (!) 107   Temp (!) 102.9 F (39.4 C) (Oral)   Resp 14   SpO2 94%   Physical Exam Vitals signs and nursing note reviewed.  Constitutional:      General: She is not in acute distress.    Appearance: She is well-developed. She is not diaphoretic.  HENT:     Head: Normocephalic and atraumatic.  Neck:     Musculoskeletal: Normal range of motion and neck supple.  Cardiovascular:     Rate and Rhythm: Normal rate and regular rhythm.     Heart sounds: No murmur. No friction rub. No gallop.   Pulmonary:     Effort: Pulmonary effort is normal. No respiratory distress.     Breath sounds: Rales present. No wheezing.     Comments: There are slight rales bilaterally. Abdominal:     General: Bowel sounds are normal. There is no distension.     Palpations: Abdomen is soft.     Tenderness: There is no abdominal tenderness.  Musculoskeletal: Normal range of motion.  Skin:    General: Skin is warm and dry.  Neurological:     Mental Status: She is alert and oriented to person, place, and time.      ED Treatments / Results  Labs (all labs ordered are listed, but only abnormal results are displayed) Labs Reviewed  CULTURE, BLOOD (ROUTINE X 2)  CULTURE, BLOOD (ROUTINE X 2)  SARS CORONAVIRUS 2 (HOSPITAL ORDER, [place] [facility])  COMPREHENSIVE METABOLIC PANEL  CBC WITH DIFFERENTIAL/PLATELET  URINALYSIS, ROUTINE W REFLEX MICROSCOPIC  LACTIC ACID, PLASMA  LACTIC ACID, PLASMA    EKG None  Radiology No results found.  Procedures Procedures (including critical care time)  Medications Ordered in ED Medications  sodium chloride 0.9 % bolus 500 mL (has no administration in time range)     Initial Impression / Assessment and Plan / ED Course  I have reviewed the triage vital signs and the nursing notes.  Pertinent labs & imaging results that were available during my care of the  patient were reviewed by me and considered in my medical decision making (see chart for details).  Patient presenting with fever, headache, and not feeling well.  Patient's laboratory studies show acute kidney injury, but no elevation of white count.  Her chest x-ray is clear and COVID swab is pending.  Patient with febrile illness of undetermined etiology.  Urinalysis is pending as are blood cultures.  Care will be signed out to Dr. Rex Kras at shift change.  She will obtain the results of the urinalysis and laboratory studies and determine the final disposition.  Final Clinical Impressions(s) / ED Diagnoses   Final diagnoses:  None    ED Discharge Orders    None  Veryl Speak, MD 01/27/19 [phone]

## 2019-01-25 NOTE — H&P (Addendum)
History and Physical  Amanda Berry ZOX:[id number] DOB: 28-Aug-1934 DOA: 01/25/2019  Referring physician: Dr Rex Kras  PCP: Shirline Frees, MD  Outpatient Specialists: None Patient coming from: Home  Chief Complaint: High fevers  HPI: Amanda Berry is a 85 y.o. female with medical history significant for hypertension, overactive bladder who presented to [facility] ED with complaints of persistent high fevers up to 103 with onset on Sunday 5 days ago.  Associated with significant headaches, intermittent diarrhea, non productive cough, sore throat and shortness of breath with minimal exertion.  States she was doing fine up until Sunday after she attended church.  States she practices social distancing at [employer].  Denies any known exposure to COVID-19.  States her primary care provider had her tested and it was negative.  Contacted her primary care provider first who recommended that she comes to the ED for further evaluation.  States she took over-the-counter medications and alternated Tylenol with ibuprofen with no improvement of her fevers and headaches.  Denies chest pain.  States she has never felt this sick before.  She lives at home with her husband and he seems fine.  Denies prior use of tobacco or oxygen supplementation.  ED Course: Upon presentation to the ED, fever with T-max of 102.9, heart rate 110, respiration rate 26.  Lab studies remarkable for elevated lactic acid 2.1, elevated creatinine 2.02, low sodium 125.  First COVID-19 testing negative.  Chest x-ray showed no sign of lobular infiltrates.  Independently reviewed chest x-ray with showed possible patchy infiltrates right greater than left.  TRH asked to admit for sepsis of unclear etiology.  Review of Systems: Review of systems as noted in the HPI. All other systems reviewed and are negative.   Past Medical History:  Diagnosis Date  . Constipation   . Fracture of femur, right, closed ([place]) 2017  . Hypertension   . Overactive bladder    . Symptomatic anemia 03/2016   Past Surgical History:  Procedure Laterality Date  . KNEE ARTHROPLASTY Right    approx 10 years ago  . ORIF PERIPROSTHETIC FRACTURE Right 04/12/2016   Procedure: OPEN REDUCTION INTERNAL FIXATION (ORIF) RIGHT DISTAL FEMUR  PERIPROSTHETIC FRACTURE;  Surgeon: Paralee Cancel, MD;  Location: [facility];  Service: Orthopedics;  Laterality: Right;  . ROTATOR CUFF REPAIR Right 1990's    Social History:  reports that she has never smoked. She has never used smokeless tobacco. She reports that she does not drink alcohol or use drugs.   No Known Allergies  Family History  Problem Relation Age of Onset  . Hypertension Mother   . Colon cancer Father   . Valvular heart disease Sister   . Kidney disease Brother   . Alcoholism Brother   . Lung cancer Sister       Prior to Admission medications   Medication Sig Start Date End Date Taking? Authorizing Provider  docusate sodium (COLACE) 100 MG capsule Take 1 capsule (100 mg total) by mouth 2 (two) times daily. 04/15/16   Danae Orleans, PA-C  HYDROcodone-acetaminophen (NORCO/VICODIN) 5-325 MG tablet Take 1-2 tablets by mouth every 4 (four) hours as needed (for pain). 04/20/16   Verlee Monte, MD  lisinopril (PRINIVIL,ZESTRIL) 20 MG tablet Take 20 mg by mouth daily.  01/15/16   [provider]  oxybutynin (DITROPAN) 5 MG tablet Take 5 mg by mouth at bedtime.  03/23/16   [provider]  polyethylene glycol (MIRALAX / GLYCOLAX) packet Take 17 g by mouth 2 (two) times daily. 04/15/16  Danae Orleans, PA-C  Protein (PROCEL) POWD Take 2 scoop by mouth 2 (two) times daily.    [provider]  ranitidine (ZANTAC) 300 MG tablet Take 300 mg by mouth at bedtime.  02/17/16   [provider]  Red Yeast Rice 600 MG CAPS Take 1 capsule by mouth daily.    [provider]  tiZANidine (ZANAFLEX) 4 MG tablet Take 1 tablet (4 mg total) by mouth every 6 (six) hours as needed for muscle spasms. 04/15/16    Danae Orleans, PA-C    Physical Exam: BP (!) 112/49   Pulse 95   Temp (!) 103.1 F (39.5 C) (Oral)   Resp (!) 28   Ht 5' (1.524 m)   Wt 72.6 kg   SpO2 94%   BMI 31.25 kg/m   . General: 85 y.o. year-old female well developed well nourished in no acute distress.  Alert and oriented x3. . Cardiovascular: Regular rate and rhythm with no rubs or gallops.  No thyromegaly or JVD noted.  No lower extremity edema. 2/4 pulses in all 4 extremities. Marland Kitchen Respiratory: Diffuse rhonchorous sounds and wheezes bilaterally. Good inspiratory effort. . Abdomen: Soft nontender nondistended with normal bowel sounds x4 quadrants. . Muskuloskeletal: No cyanosis, clubbing or edema noted bilaterally . Neuro: CN II-XII intact, strength, sensation, reflexes . Skin: No ulcerative lesions noted or rashes . Psychiatry: Judgement and insight appear normal. Mood is appropriate for condition and setting          Labs on Admission:  Basic Metabolic Panel: Recent Labs  Lab 01/25/19 1411  NA 125*  K 3.8  CL 94*  CO2 20*  GLUCOSE 123*  BUN 39*  CREATININE 2.02*  CALCIUM 8.4*   Liver Function Tests: Recent Labs  Lab 01/25/19 1411  AST 63*  ALT 42  ALKPHOS 83  BILITOT 1.0  PROT 6.3*  ALBUMIN 2.6*   No results for input(s): LIPASE, AMYLASE in the last 168 hours. No results for input(s): AMMONIA in the last 168 hours. CBC: Recent Labs  Lab 01/25/19 1411  WBC 9.9  NEUTROABS 9.2*  HGB 11.5*  HCT 35.2*  MCV 93.9  PLT 154   Cardiac Enzymes: No results for input(s): CKTOTAL, CKMB, CKMBINDEX, TROPONINI in the last 168 hours.  BNP (last 3 results) No results for input(s): BNP in the last 8760 hours.  ProBNP (last 3 results) No results for input(s): PROBNP in the last 8760 hours.  CBG: No results for input(s): GLUCAP in the last 168 hours.  Radiological Exams on Admission: Dg Chest Port 1 View  Result Date: 01/25/2019 CLINICAL DATA:  Fever EXAM: PORTABLE CHEST 1 VIEW COMPARISON:   04/11/2016 FINDINGS: The heart size and mediastinal contours are within normal limits. Both lungs are clear. The visualized skeletal structures are unremarkable. IMPRESSION: No active disease. Electronically Signed   By: Kathreen Devoid   On: 01/25/2019 16:43    EKG: I independently viewed the EKG done and my findings are as followed: Sinus tachycardia rate of 104.  No specific ST-T changes.  No QTC prolongation.  Assessment/Plan Present on Admission: . Sepsis ([place])  Active Problems:   Sepsis ([place])  Sepsis of unclear etiology at this point Presented with fever with T-max 102.9, tachycardia with heart rate 114, tachypnea with respiration rate of 26, elevated lactic acid 2.1 Chest x-ray independently reviewed showed possible patchy infiltrates bibasilar right greater than left Started on IV antibiotics empirically IV Vanco and Zosyn First test of COVID-19 negative High suspicion for COVID-19 due  to symptoms on presentation with persistent high fevers, headaches, cough, shortness of breath, intermittent GI symptoms with diarrhea. We will repeat COVID-19 with send out test UA pending Monitor fever curve and WBC Blood cultures x2 pending Repeat CBC in the morning  Acute hypoxic respiratory failure possibly secondary to viral lung infection No leukocytosis thus far Obtain procalcitonin COVID-19 send out pending No prior history of oxygen supplementation No prior history of tobacco use Requiring 2 L of oxygen by nasal cannula to maintain O2 sat greater than 90% Maintain O2 saturation greater than 90% Albuterol inhalers Closely monitor  Hypovolemic hyponatremia Presented with sodium 125 Continue IV fluid Repeat BMP Obtain urine lites in the morning if no improvement  AKI, likely prerenal Baseline creatinine appears to be 0.6 with GFR greater than 60 Presented with creatinine of 2.02 Monitor urine output Avoid nephrotoxins Continue IV fluid hydration Repeat BMP in the morning   Headache  Treat symptomatically  Hypertension Blood pressure is soft Lisinopril due to AKI and due to soft blood pressures Continue to monitor vital signs closely  Intermittent diarrhea Likely of viral etiology Monitor for recurrence  Risks: High risk for decompensation due to sepsis of unclear etiology, acute hypoxic respiratory failure, and advanced age.  Patient will require least 2 midnights for further evaluation and treatment of present condition.   DVT prophylaxis: Subcu Lovenox daily  Code Status: Full code  Family Communication: We will contact family if okay with patient.  Disposition Plan: Admit to telemetry medical, PUI for COVID-19  Consults called: None  Admission status: Inpatient status    Kayleen Memos MD [facility] Pager [phone]  If 7PM-7AM, please contact night-coverage [email or web address] Password [facility]  01/25/2019, 6:17 PM

## 2019-01-25 NOTE — ED Notes (Signed)
ED TO INPATIENT HANDOFF REPORT  ED Nurse Name and Phone #: Louine Tenpenny [phone]  S Name/Age/Gender Amanda Berry 85 y.o. female Room/Bed: 020C/020C  Code Status   Code Status: Prior  Home/SNF/Other Home Patient oriented to: self, place, time and situation Is this baseline? Yes   Triage Complete: Triage complete  Chief Complaint sent by dr/poss fever  Triage Note Pt reports fever, headache, right arm tremors since "Sunday.    Allergies No Known Allergies  Level of Care/Admitting Diagnosis ED Disposition    ED Disposition Condition Comment   Admit  [facility]ospital Area[facility] [[id number]]  Level of Care: Telemetry Medical [104]  Covid Evaluation: Person Under Investigation (PUI)  Isolation Risk Level: Low Risk/Droplet (Less than 4L [place] supplementation)  Diagnosis: Sepsis (H[facility]) [1191708]  Admitting Physician: HALL, CAROLE N [[id number]]  Attending Physician: HALL, CAROLE N [[id number]]  Estimated length of stay: past midnight tomorrow  Certification:: I certify this patient will need inpatient services for at least 2 midnights  PT Class (Do Not Modify): Inpatient [101]  PT Acc Code (Do Not Modify): Private [1]       B Medical/Surgery History Past Medical History:  Diagnosis Date  . Constipation   . Fracture of femur, right, closed ([facility]) 2017  . Hypertension   . Overactive bladder   . Symptomatic anemia 03/2016   Past Surgical History:  Procedure Laterality Date  . KNEE ARTHROPLASTY Right    approx 10 years ago  . ORIF PERIPROSTHETIC FRACTURE Right 04/12/2016   Procedure: OPEN REDUCTION INTERNAL FIXATION (ORIF) RIGHT DISTAL FEMUR  PERIPROSTHETIC FRACTURE;  Surgeon: Matthew Olin, MD;  Location: [facility];  Service: Orthopedics;  Laterality: Right;  . ROTATOR CUFF REPAIR Right 1990's     A IV Location/Drains/Wounds Patient Lines/Drains/Airways Status   Active Line/Drains/Airways    Name:   Placement date:   Placement time:   Site:   Days:   Peripheral IV  01/25/19 Left Antecubital   01/25/19    1427    Antecubital   less than 1   Incision (Closed) 04/12/16 Knee Right   04/12/16    1616     10" 18          Intake/Output Last 24 hours  Intake/Output Summary (Last 24 hours) at 01/25/2019 1804 Last data filed at 01/25/2019 1744 Gross per 24 hour  Intake 825 ml  Output -  Net 825 ml    Labs/Imaging Results for orders placed or performed during the hospital encounter of 01/25/19 (from the past 48 hour(s))  Comprehensive metabolic panel     Status: Abnormal   Collection Time: 01/25/19  2:11 PM  Result Value Ref Range   Sodium 125 (L) 135 - 145 mmol/L   Potassium 3.8 3.5 - 5.1 mmol/L   Chloride 94 (L) 98 - 111 mmol/L   CO2 20 (L) 22 - 32 mmol/L   Glucose, Bld 123 (H) 70 - 99 mg/dL   BUN 39 (H) 8 - 23 mg/dL   Creatinine, Ser 2.02 (H) 0.44 - 1.00 mg/dL   Calcium 8.4 (L) 8.9 - 10.3 mg/dL   Total Protein 6.3 (L) 6.5 - 8.1 g/dL   Albumin 2.6 (L) 3.5 - 5.0 g/dL   AST 63 (H) 15 - 41 U/L   ALT 42 0 - 44 U/L   Alkaline Phosphatase 83 38 - 126 U/L   Total Bilirubin 1.0 0.3 - 1.2 mg/dL   GFR calc non Af Amer 22 (L) >60 mL/min   GFR calc Af  Amer 26 (L) >60 mL/min   Anion gap 11 5 - 15    Comment: Performed at [facility], [place] [address] [place], [place] [address]  CBC with Differential     Status: Abnormal   Collection Time: 01/25/19  2:11 PM  Result Value Ref Range   WBC 9.9 4.0 - 10.5 K/uL   RBC 3.75 (L) 3.87 - 5.11 MIL/uL   Hemoglobin 11.5 (L) 12.0 - 15.0 g/dL   HCT 35.2 (L) 36.0 - 46.0 %   MCV 93.9 80.0 - 100.0 fL   MCH 30.7 26.0 - 34.0 pg   MCHC 32.7 30.0 - 36.0 g/dL   RDW 14.1 11.5 - 15.5 %   Platelets 154 150 - 400 K/uL   nRBC 0.0 0.0 - 0.2 %   Neutrophils Relative % 91 %   Lymphocytes Relative 7 %   Monocytes Relative 0 %   Eosinophils Relative 0 %   Basophils Relative 0 %   Band Neutrophils 0 %   Metamyelocytes Relative 2 %   Myelocytes 0 %   Promyelocytes Relative 0 %   Blasts 0 %   nRBC 0 0 /100 WBC   Other 0  %   Neutro Abs 9.2 (H) 1.7 - 7.7 K/uL   Lymphs Abs 0.7 0.7 - 4.0 K/uL   Monocytes Absolute 0.0 (L) 0.1 - 1.0 K/uL   Eosinophils Absolute 0.0 0.0 - 0.5 K/uL   Basophils Absolute 0.0 0.0 - 0.1 K/uL   RBC Morphology BURR CELLS     Comment: TEARDROP CELLS   WBC Morphology VACUOLATED NEUTROPHILS     Comment: Performed at [facility], [place] [address], [place], [place] [address]  Lactic acid, plasma     Status: Abnormal   Collection Time: 01/25/19  2:11 PM  Result Value Ref Range   Lactic Acid, Venous 2.1 (HH) 0.5 - 1.9 mmol/L    Comment: CRITICAL RESULT CALLED TO, READ BACK BY AND VERIFIED WITH: DAIN,C RN @1458  ON [id number] BY FLEMINGS Performed at [facility] [address], [place], [place] [address]   SARS Coronavirus 2 (CEPHEID- Performed in [place] hospital lab), Hosp Order     Status: None   Collection Time: 01/25/19  2:57 PM  Result Value Ref Range   SARS Coronavirus 2 NEGATIVE NEGATIVE    Comment: (NOTE) If result is NEGATIVE SARS-CoV-2 target nucleic acids are NOT DETECTED. The SARS-CoV-2 RNA is generally detectable in upper and lower  respiratory specimens during the acute phase of infection. The lowest  concentration of SARS-CoV-2 viral copies this assay can detect is 250  copies / mL. A negative result does not preclude SARS-CoV-2 infection  and should not be used as the sole basis for treatment or other  patient management decisions.  A negative result may occur with  improper specimen collection / handling, submission of specimen other  than nasopharyngeal swab, presence of viral mutation(s) within the  areas targeted by this assay, and inadequate number of viral copies  (<250 copies / mL). A negative result must be combined with clinical  observations, patient history, and epidemiological information. If result is POSITIVE SARS-CoV-2 target nucleic acids are DETECTED. The SARS-CoV-2 RNA is generally detectable in upper and lower  respiratory specimens  dur ing the acute phase of infection.  Positive  results are indicative of active infection with SARS-CoV-2.  Clinical  correlation with patient history and other diagnostic information is  necessary to determine patient infection status.  Positive results do  not rule out bacterial infection or co-infection with other viruses. If result is PRESUMPTIVE POSTIVE SARS-CoV-2 nucleic acids MAY BE PRESENT.   A presumptive positive result was obtained on the submitted specimen  and confirmed on repeat testing.  While 2019 novel coronavirus  (SARS-CoV-2) nucleic acids may be present in the submitted sample  additional confirmatory testing may be necessary for epidemiological  and / or clinical management purposes  to differentiate between  SARS-CoV-2 and other Sarbecovirus currently known to infect humans.  If clinically indicated additional testing with an alternate test  methodology [phone]) is advised. The SARS-CoV-2 RNA is generally  detectable in upper and lower respiratory sp ecimens during the acute  phase of infection. The expected result is Negative. Fact Sheet for Patients:  StrictlyIdeas.no Fact Sheet for Healthcare Providers: BankingDealers.co.za This test is not yet approved or cleared by the [place] FDA and has been authorized for detection and/or diagnosis of SARS-CoV-2 by FDA under an Emergency Use Authorization (EUA).  This EUA will remain in effect (meaning this test can be used) for the duration of the COVID-19 declaration under Section 564(b)(1) of the Act, 21 U.S.C. section 360bbb-3(b)(1), unless the authorization is terminated or revoked sooner. Performed at [facility], [place] [address] [place], [place] [address]   Lactic acid, plasma     Status: Abnormal   Collection Time: 01/25/19  4:26 PM  Result Value Ref Range   Lactic Acid, Venous 2.4 (HH) 0.5 - 1.9 mmol/L    Comment: CRITICAL RESULT CALLED TO, READ  BACK BY AND VERIFIED WITH: T Nikitta Sobiech,RN 1722 01/25/2019 D BRADLEY Performed at [facility], [place] [address], [place], [place] [address]   Urinalysis, Routine w reflex microscopic     Status: Abnormal   Collection Time: 01/25/19  5:30 PM  Result Value Ref Range   Color, Urine AMBER (A) YELLOW    Comment: BIOCHEMICALS MAY BE AFFECTED BY COLOR   APPearance CLOUDY (A) CLEAR   Specific Gravity, Urine 1.018 1.005 - 1.030   pH 5.0 5.0 - 8.0   Glucose, UA NEGATIVE NEGATIVE mg/dL   Hgb urine dipstick SMALL (A) NEGATIVE   Bilirubin Urine NEGATIVE NEGATIVE   Ketones, ur NEGATIVE NEGATIVE mg/dL   Protein, ur 30 (A) NEGATIVE mg/dL   Nitrite NEGATIVE NEGATIVE   Leukocytes,Ua TRACE (A) NEGATIVE   RBC / HPF 6-10 0 - 5 RBC/hpf   WBC, UA 6-10 0 - 5 WBC/hpf   Bacteria, UA FEW (A) NONE SEEN   Squamous Epithelial / LPF 0-5 0 - 5    Comment: Performed at [facility], [place] [address] [place], [place] [address]   Dg Chest Port 1 View  Result Date: 01/25/2019 CLINICAL DATA:  Fever EXAM: PORTABLE CHEST 1 VIEW COMPARISON:  04/11/2016 FINDINGS: The heart size and mediastinal contours are within normal limits. Both lungs are clear. The visualized skeletal structures are unremarkable. IMPRESSION: No active disease. Electronically Signed   By: Kathreen Devoid   On: 01/25/2019 16:43    Pending Labs Unresulted Labs (From admission, onward)    Start     Ordered   01/25/19 1753  Urine culture  Add-on,   STAT     01/25/19 1752   01/25/19 1411  Blood culture (routine x 2)  BLOOD CULTURE X 2,   STAT    Question:  Patient immune status  Answer:  Normal   01/25/19 1411  Vitals/Pain Today's Vitals   01/25/19 1711 01/25/19 1711 01/25/19 1715 01/25/19 1730  BP:  (!) 134/54 (!) 108/54 123/64  Pulse:  (!) 103 97 (!) 107  Resp:  (!) 33 (!) 24 (!) 26  Temp:  (!) 103.1 F (39.5 C)    TempSrc:  Oral    SpO2:  98% 98% 94%  Weight:  72.6 kg    Height:  5' (1.524 m)    PainSc: 0-No pain 0-No  pain      Isolation Precautions Droplet and Contact precautions  Medications Medications  metroNIDAZOLE (FLAGYL) IVPB 500 mg (500 mg Intravenous New Bag/Given 01/25/19 1749)  vancomycin (VANCOCIN) 1,500 mg in sodium chloride 0.9 % 500 mL IVPB (has no administration in time range)  vancomycin (VANCOCIN) 1,250 mg in sodium chloride 0.9 % 250 mL IVPB (has no administration in time range)  ceFEPIme (MAXIPIME) 2 g in sodium chloride 0.9 % 100 mL IVPB (has no administration in time range)  sodium chloride 0.9 % bolus 500 mL (0 mLs Intravenous Stopped 01/25/19 1556)  acetaminophen (TYLENOL) tablet 1,000 mg (1,000 mg Oral Given 01/25/19 1556)  sodium chloride 0.9 % bolus 1,000 mL (1,000 mLs Intravenous New Bag/Given 01/25/19 1556)  ceFEPIme (MAXIPIME) 2 g in sodium chloride 0.9 % 100 mL IVPB (0 g Intravenous Stopped 01/25/19 1744)    Mobility walks with device Moderate fall risk   Focused Assessments Pulmonary Assessment Handoff:  Lung sounds: Bilateral Breath Sounds: Diminished L Breath Sounds: Diminished R Breath Sounds: Diminished O2 Device: Room Air        R Recommendations: See Admitting Provider Note  Report given to:   Additional Notes: Tylenol given for fever and it went up.  Pt receiving Flagyl now.  Purewick in place and works best for her.

## 2019-01-26 ENCOUNTER — Encounter (HOSPITAL_COMMUNITY): Payer: Self-pay | Admitting: Cardiology

## 2019-01-26 DIAGNOSIS — N179 Acute kidney failure, unspecified: Secondary | ICD-10-CM

## 2019-01-26 DIAGNOSIS — Z20822 Contact with and (suspected) exposure to covid-19: Secondary | ICD-10-CM

## 2019-01-26 DIAGNOSIS — E871 Hypo-osmolality and hyponatremia: Secondary | ICD-10-CM

## 2019-01-26 DIAGNOSIS — R6889 Other general symptoms and signs: Secondary | ICD-10-CM

## 2019-01-26 DIAGNOSIS — J9601 Acute respiratory failure with hypoxia: Secondary | ICD-10-CM

## 2019-01-26 DIAGNOSIS — I480 Paroxysmal atrial fibrillation: Secondary | ICD-10-CM

## 2019-01-26 DIAGNOSIS — I4819 Other persistent atrial fibrillation: Secondary | ICD-10-CM

## 2019-01-26 LAB — PROCALCITONIN: Procalcitonin: 6.31 ng/mL

## 2019-01-26 LAB — RESPIRATORY PANEL BY PCR

## 2019-01-26 LAB — BASIC METABOLIC PANEL
Anion gap: 13 (ref 5–15)
BUN: 37 mg/dL — ABNORMAL HIGH (ref 8–23)
CO2: 17 mmol/L — ABNORMAL LOW (ref 22–32)
Calcium: 7.4 mg/dL — ABNORMAL LOW (ref 8.9–10.3)
Chloride: 102 mmol/L (ref 98–111)
Creatinine, Ser: 1.55 mg/dL — ABNORMAL HIGH (ref 0.44–1.00)
GFR calc Af Amer: 36 mL/min — ABNORMAL LOW (ref >60)
GFR calc non Af Amer: 31 mL/min — ABNORMAL LOW (ref >60)
Glucose, Bld: 103 mg/dL — ABNORMAL HIGH (ref 70–99)
Potassium: 3.6 mmol/L (ref 3.5–5.1)
Sodium: 132 mmol/L — ABNORMAL LOW (ref 135–145)

## 2019-01-26 LAB — URINE CULTURE

## 2019-01-26 MED ORDER — METOPROLOL TARTRATE 12.5 MG HALF TABLET
12.5000 mg | ORAL_TABLET | Freq: Two times a day (BID) | ORAL | Status: DC
  Administered 2019-01-26 – 2019-01-27 (×3): 12.5 mg via ORAL
  Filled 2019-01-26 (×4): qty 1

## 2019-01-26 MED ORDER — DILTIAZEM HCL 25 MG/5ML IV SOLN
10.0000 mg | Freq: Once | INTRAVENOUS | Status: AC
  Administered 2019-01-26: 10 mg via INTRAVENOUS
  Filled 2019-01-26: qty 5

## 2019-01-26 MED ORDER — GUAIFENESIN ER 600 MG PO TB12
1200.0000 mg | ORAL_TABLET | Freq: Two times a day (BID) | ORAL | Status: DC
  Administered 2019-01-26 – 2019-01-31 (×10): 1200 mg via ORAL
  Filled 2019-01-26 (×10): qty 2

## 2019-01-26 MED ORDER — ENOXAPARIN SODIUM 80 MG/0.8ML ~~LOC~~ SOLN
75.0000 mg | SUBCUTANEOUS | Status: DC
  Administered 2019-01-26 – 2019-01-28 (×3): 75 mg via SUBCUTANEOUS
  Filled 2019-01-26 (×3): qty 0.8

## 2019-01-26 MED ORDER — VANCOMYCIN HCL IN DEXTROSE 750-5 MG/150ML-% IV SOLN: 750.0000 mg | INTRAVENOUS | Status: DC

## 2019-01-26 MED ORDER — DILTIAZEM HCL-DEXTROSE 100-5 MG/100ML-% IV SOLN (PREMIX)
5.0000 mg/h | INTRAVENOUS | Status: AC
  Administered 2019-01-26: 10 mg/h via INTRAVENOUS
  Administered 2019-01-26: 5 mg/h via INTRAVENOUS
  Filled 2019-01-26 (×2): qty 100

## 2019-01-26 NOTE — Progress Notes (Signed)
PROGRESS NOTE  Amanda Berry XNA:[id number] DOB: 09-Feb-1934 DOA: 01/25/2019 PCP: Shirline Frees, MD  HPI/Recap of past 24 hours:  Martin Majestic into afib/aflutter /RVR last night, she is started on cardizem drip   Remain febrile, congested cough, no hypoxia at rest  Reports no bm for the last two days   Assessment/Plan: Active Problems:   Sepsis ([place])  Sepsis presented on presentation with fever, lactic acidosis, tachycardia, tachypnea -Awaiting for respiratory viral panel, if negative, will repeat COVID testing -mrsa screen negative, blood culture negative, d/c vanc, continue zosyn for now -Initial chest x  Ray no acute findings, Consider repeat cxr tomorrow after adequate hydration -f/u on urine culture result -no diarrhea since in the hospital  Acute hypoxic respiratory failure, likely due to viral pneumonia/bronchitis, initial cxr no acute findings, will repeat cxr tomorrow after adequate hydration Awaiting for respiratory viral panel, if negative, will repeat COVID testing Baseline not o2 dependent, o2 dropped to 89 on room air on presentation, she is started on 2liter o2  AKI  Urine culture in process Likely prerenal Improving on ivf Cr 2.1>1,6>1.5 Repeat lab in am, renal dosing meds  Hyponatremia Sodium 125 on presentation, likely due to dehydration,  Improving on ivf  New onset of aflutter/afib cardizem drip started, cardiology consulted, will follow recommendation   Code Status: full  Family Communication: patient   Disposition Plan: not ready to discharge   Consultants:  cardiology  Procedures:  none  Antibiotics:  vanc on admission  Zosyn since admission    Objective: BP (!) 143/74 (BP Location: Left Arm)   Pulse (!) 146   Temp (!) 103.2 F (39.6 C) (Oral)   Resp (!) 24   Ht 5' (1.524 m)   Wt 72.6 kg   SpO2 91%   BMI 31.25 kg/m   Intake/Output Summary (Last 24 hours) at 01/26/2019 0726 Last data filed at 01/26/2019 0600 Gross per 24  hour  Intake 1682.39 ml  Output 200 ml  Net 1482.39 ml   Filed Weights   01/25/19 1711  Weight: 72.6 kg    Exam: Patient is examined daily including today on 01/26/2019, exams remain the same as of yesterday except that has changed    General:  Weak but NAD, aaox3  Cardiovascular: IRRR  Respiratory: + rhonchi   Abdomen: Soft/ND/NT, positive BS  Musculoskeletal: No Edema  Neuro: alert, oriented   Data Reviewed: Basic Metabolic Panel: Recent Labs  Lab 01/25/19 1411 01/25/19 2039  NA 125* 128*  K 3.8 4.0  CL 94* 99  CO2 20* 19*  GLUCOSE 123* 109*  BUN 39* 38*  CREATININE 2.02* 1.66*  CALCIUM 8.4* 7.8*   Liver Function Tests: Recent Labs  Lab 01/25/19 1411  AST 63*  ALT 42  ALKPHOS 83  BILITOT 1.0  PROT 6.3*  ALBUMIN 2.6*   No results for input(s): LIPASE, AMYLASE in the last 168 hours. No results for input(s): AMMONIA in the last 168 hours. CBC: Recent Labs  Lab 01/25/19 1411 01/25/19 2039  WBC 9.9 8.5  NEUTROABS 9.2* 7.7  HGB 11.5* 11.0*  HCT 35.2* 33.0*  MCV 93.9 92.4  PLT 154 143*   Cardiac Enzymes:   No results for input(s): CKTOTAL, CKMB, CKMBINDEX, TROPONINI in the last 168 hours. BNP (last 3 results) No results for input(s): BNP in the last 8760 hours.  ProBNP (last 3 results) No results for input(s): PROBNP in the last 8760 hours.  CBG: No results for input(s): GLUCAP in the last 168 hours.  Recent Results (from the past 240 hour(s))  SARS Coronavirus 2 (CEPHEID- Performed in [facility] lab), Hosp Order     Status: None   Collection Time: 01/25/19  2:57 PM  Result Value Ref Range Status   SARS Coronavirus 2 NEGATIVE NEGATIVE Final    Comment: (NOTE) If result is NEGATIVE SARS-CoV-2 target nucleic acids are NOT DETECTED. The SARS-CoV-2 RNA is generally detectable in upper and lower  respiratory specimens during the acute phase of infection. The lowest  concentration of SARS-CoV-2 viral copies this assay can detect is  250  copies / mL. A negative result does not preclude SARS-CoV-2 infection  and should not be used as the sole basis for treatment or other  patient management decisions.  A negative result may occur with  improper specimen collection / handling, submission of specimen other  than nasopharyngeal swab, presence of viral mutation(s) within the  areas targeted by this assay, and inadequate number of viral copies  (<250 copies / mL). A negative result must be combined with clinical  observations, patient history, and epidemiological information. If result is POSITIVE SARS-CoV-2 target nucleic acids are DETECTED. The SARS-CoV-2 RNA is generally detectable in upper and lower  respiratory specimens dur ing the acute phase of infection.  Positive  results are indicative of active infection with SARS-CoV-2.  Clinical  correlation with patient history and other diagnostic information is  necessary to determine patient infection status.  Positive results do  not rule out bacterial infection or co-infection with other viruses. If result is PRESUMPTIVE POSTIVE SARS-CoV-2 nucleic acids MAY BE PRESENT.   A presumptive positive result was obtained on the submitted specimen  and confirmed on repeat testing.  While 2019 novel coronavirus  (SARS-CoV-2) nucleic acids may be present in the submitted sample  additional confirmatory testing may be necessary for epidemiological  and / or clinical management purposes  to differentiate between  SARS-CoV-2 and other Sarbecovirus currently known to infect humans.  If clinically indicated additional testing with an alternate test  methodology [phone]) is advised. The SARS-CoV-2 RNA is generally  detectable in upper and lower respiratory sp ecimens during the acute  phase of infection. The expected result is Negative. Fact Sheet for Patients:  StrictlyIdeas.no Fact Sheet for Healthcare Providers:  BankingDealers.co.za This test is not yet approved or cleared by the [place] FDA and has been authorized for detection and/or diagnosis of SARS-CoV-2 by FDA under an Emergency Use Authorization (EUA).  This EUA will remain in effect (meaning this test can be used) for the duration of the COVID-19 declaration under Section 564(b)(1) of the Act, 21 U.S.C. section 360bbb-3(b)(1), unless the authorization is terminated or revoked sooner. Performed at [facility], [place] [address], [place], [place] [address]   MRSA PCR Screening     Status: None   Collection Time: 01/25/19  9:27 PM  Result Value Ref Range Status   MRSA by PCR NEGATIVE NEGATIVE Final    Comment:        The GeneXpert MRSA Assay (FDA approved for NASAL specimens only), is one component of a comprehensive MRSA colonization surveillance program. It is not intended to diagnose MRSA infection nor to guide or monitor treatment for MRSA infections. Performed at [facility], [place] [address] [place], [place] [address]      Studies: Dg Chest Port 1 View  Result Date: 01/25/2019 CLINICAL DATA:  Fever EXAM: PORTABLE CHEST 1 VIEW COMPARISON:  04/11/2016 FINDINGS: The heart size and mediastinal  contours are within normal limits. Both lungs are clear. The visualized skeletal structures are unremarkable. IMPRESSION: No active disease. Electronically Signed   By: Kathreen Devoid   On: 01/25/2019 16:43    Scheduled Meds: . albuterol  2 puff Inhalation Q6H  . dextromethorphan-guaiFENesin  2 tablet Oral BID  . enoxaparin (LOVENOX) injection  30 mg Subcutaneous Q24H  . famotidine  20 mg Oral Daily  . oxybutynin  5 mg Oral QHS    Continuous Infusions: . sodium chloride 75 mL/hr at 01/26/19 0539  . ceFEPime (MAXIPIME) IV    . diltiazem (CARDIZEM) infusion    . [START ON 01/27/2019] vancomycin       Time spent: 19mins I have personally reviewed and interpreted on  01/26/2019 daily labs, tele  strips, imagings as discussed above under date review session and assessment and plans.  I reviewed all nursing notes, pharmacy notes, consultant notes,  vitals, pertinent old records  I have discussed plan of care as described above with RN , patient  on 01/26/2019   Florencia Reasons MD, PhD  [facility] Pager [phone]. If 7PM-7AM, please contact night-coverage at [email or web address], password [facility] 01/26/2019, 7:26 AM  LOS: 1 day

## 2019-01-26 NOTE — Progress Notes (Signed)
Pharmacy Antibiotic Note  Amanda Berry is a 85 y.o. female admitted on 01/25/2019 with sepsis.  Pharmacy has been consulted for vancomycin/cefepime dosing. Tmax 102.9. WBC 9.9, LA 2.1>2.4. Scr 2.02 (baseline 0.6-0.7).  Plan: Vancomycin 1500 mg x1 then 750mg  q36hr Cefepime 2g IV q24h Monitor clinical status, renal function, cultures, and length of therapy Monitor vancomycin levels as indicated  Temp (24hrs), Avg:101.6 F (38.7 C), Min:99 F (37.2 C), Max:103.2 F (39.6 C)  Recent Labs  Lab 01/25/19 1411 01/25/19 1626 01/25/19 2039 01/26/19 0745  WBC 9.9  --  8.5  --   CREATININE 2.02*  --  1.66* 1.55*  LATICACIDVEN 2.1* 2.4*  --   --     Estimated Creatinine Clearance: 24.4 mL/min (A) (by C-G formula based on SCr of 1.55 mg/dL (H)).    No Known Allergies  Antimicrobials this admission: Cefepime 5/28 >> Vancomycin 5/28 >> Flagyl 5/28 x1  Dose adjustments this admission: 5/28 Vanc 1250 mg IV Q48hrs calc AUC 48?, SCr 2.02 5/29 SCr decr to 1.55, recalc to 750mg  q36h  -- for AUC 450, sCr 1.55, Vd 0.5 (BMI 31+)  Microbiology results: 5/28 BCx: ngtd 5/28 Covid in house: neg 5/28 Covid sent out: sent  5/28 UCx: sent 5/28 MRSA PCR: neg  Thank you for allowing pharmacy to be a part of this patient's care.   Minda Ditto  PharmD [phone] Please check AMION for all Pharmacist numbers by unit 01/26/2019 11:57 AM

## 2019-01-26 NOTE — Progress Notes (Signed)
At approximately 0500, patient requested to use the restroom.  The nurse tech assisted patient to the bathroom.  Patient's heart rate increased into the 170's-200's.  Patient was assisted back to bed.  Heart rate decreased slightly to the 160's.  Patient stated she felt fine.  An EKG was done.  It showed atrial flutter.  On call NP was paged and he submitted an order for cardizem injection 10 mg.  Medication was administered at 0528.  Heart rate decreased to the 130's-140's.  Will continue to monitor.

## 2019-01-26 NOTE — Progress Notes (Signed)
[place] for Lovenox Indication: atrial fibrillation  No Known Allergies  Patient Measurements: Height: 5' (152.4 cm) Weight: 160 lb (72.6 kg) IBW/kg (Calculated) : 45.5  Vital Signs: Temp: 99 F (37.2 C) (05/29 0810) Temp Source: Oral (05/29 0810) BP: 105/83 (05/29 0810) Pulse Rate: 143 (05/29 0810)  Labs: Recent Labs    01/25/19 1411 01/25/19 2039 01/26/19 0745  HGB 11.5* 11.0*  --   HCT 35.2* 33.0*  --   PLT 154 143*  --   CREATININE 2.02* 1.66* 1.55*   Estimated Creatinine Clearance: 24.4 mL/min (A) (by C-G formula based on SCr of 1.55 mg/dL (H)).  Medical History: Past Medical History:  Diagnosis Date  . Constipation   . Fracture of femur, right, closed ([place]) 2017  . Hypertension   . Overactive bladder   . Symptomatic anemia 03/2016   Medications:  Scheduled:  . albuterol  2 puff Inhalation Q6H  . dextromethorphan-guaiFENesin  2 tablet Oral BID  . enoxaparin (LOVENOX) injection  75 mg Subcutaneous Q24H  . famotidine  20 mg Oral Daily  . metoprolol tartrate  12.5 mg Oral BID  . oxybutynin  5 mg Oral QHS   Infusions:  . sodium chloride 75 mL/hr at 01/26/19 0539  . ceFEPime (MAXIPIME) IV    . diltiazem (CARDIZEM) infusion 10 mg/hr (01/26/19 1115)  . [START ON 01/27/2019] vancomycin     Assessment: 44 yoF admit 5/28, met sepsis parameters. Antibiotics started, treating as Covid +, Covid neg x1, rpt Covid send out. New onset Afib, on diltiazem infusion, Cards added po Metoprolol. Begin Lovenox for Afib, planning transition to Apixaban  Goal of Therapy:  Monitor CBC, s/s bleed   Plan:   Discontinue Lovenox 48m q24, prophylactic dose  Lovenox 731mSQ q24, therapeutic dose with clearance < 30 ml/min  GrMinda DittoharmD [phone]/29/2020,12:28 PM

## 2019-01-26 NOTE — Plan of Care (Signed)
  Problem: Nutrition: Goal: Adequate nutrition will be maintained Outcome: Progressing   Problem: Coping: Goal: Level of anxiety will decrease Outcome: Progressing   Problem: Pain Managment: Goal: General experience of comfort will improve Outcome: Progressing   Problem: Safety: Goal: Ability to remain free from injury will improve Outcome: Progressing   

## 2019-01-26 NOTE — Consult Note (Signed)
Cardiology Consultation:   Due to the COVID-19 pandemic, this visit was completed with telemedicine (audio/video) technology to reduce patient and provider exposure as well as to preserve personal protective equipment.   Patient ID: Amanda Berry MRN: [id number]; DOB: 1934/03/06  Admit date: 01/25/2019 Date of Consult: 01/26/2019  Primary Care Provider: Shirline Frees, MD Primary Cardiologist: New; Dr Stanford Breed   Patient Profile:   Amanda Berry is a 84 y.o. female with a hx of hypertension who is being seen today for the evaluation of atrial fibillation/flutter at the request of Florencia Reasons MD.  History of Present Illness:   Patient has no prior cardiac history.  She typically has dyspnea with more vigorous activities but not routine activities.  She does not have a history of orthopnea, PND, pedal edema, chest pain, palpitations or syncope.  No history of bleeding.  Over the past 5 days she complains of headache, fevers up to 103, chills, intermittent nausea and diarrhea.  She denies dyspnea or productive cough and there is no chest pain or palpitations.  She has general malaise.  She has been admitted and placed on antibiotics.  She is also being ruled out for COVID.  She developed atrial flutter and then fibrillation and cardiology asked to evaluate.  Past Medical History:  Diagnosis Date  . Constipation   . Fracture of femur, right, closed ([place]) 2017  . Hypertension   . Overactive bladder   . Symptomatic anemia 03/2016    Past Surgical History:  Procedure Laterality Date  . KNEE ARTHROPLASTY Right    approx 10 years ago  . ORIF PERIPROSTHETIC FRACTURE Right 04/12/2016   Procedure: OPEN REDUCTION INTERNAL FIXATION (ORIF) RIGHT DISTAL FEMUR  PERIPROSTHETIC FRACTURE;  Surgeon: Paralee Cancel, MD;  Location: [facility] ORS;  Service: Orthopedics;  Laterality: Right;  . ROTATOR CUFF REPAIR Right 1990's     Inpatient Medications: Scheduled Meds: . albuterol  2 puff Inhalation Q6H  .  dextromethorphan-guaiFENesin  2 tablet Oral BID  . enoxaparin (LOVENOX) injection  30 mg Subcutaneous Q24H  . famotidine  20 mg Oral Daily  . oxybutynin  5 mg Oral QHS   Continuous Infusions: . sodium chloride 75 mL/hr at 01/26/19 0539  . ceFEPime (MAXIPIME) IV    . diltiazem (CARDIZEM) infusion 10 mg/hr (01/26/19 1115)  . [START ON 01/27/2019] vancomycin     PRN Meds: acetaminophen, ondansetron (ZOFRAN) IV  Allergies:   No Known Allergies  Social History:   Social History   Socioeconomic History  . Marital status: Married    Spouse name: Not on file  . Number of children: 3  . Years of education: Not on file  . Highest education level: Not on file  Occupational History  . Not on file  Social Needs  . Financial resource strain: Not on file  . Food insecurity:    Worry: Not on file    Inability: Not on file  . Transportation needs:    Medical: Not on file    Non-medical: Not on file  Tobacco Use  . Smoking status: Never Smoker  . Smokeless tobacco: Never Used  Substance and Sexual Activity  . Alcohol use: No  . Drug use: No  . Sexual activity: Not on file  Lifestyle  . Physical activity:    Days per week: Not on file    Minutes per session: Not on file  . Stress: Not on file  Relationships  . Social connections:    Talks on phone: Not on file  Gets together: Not on file    Attends religious service: Not on file    Active member of club or organization: Not on file    Attends meetings of clubs or organizations: Not on file    Relationship status: Not on file  . Intimate partner violence:    Fear of current or ex partner: Not on file    Emotionally abused: Not on file    Physically abused: Not on file    Forced sexual activity: Not on file  Other Topics Concern  . Not on file  Social History Narrative  . Not on file    Family History:    Family History  Problem Relation Age of Onset  . Hypertension Mother   . Colon cancer Father   . Valvular heart  disease Sister   . Kidney disease Brother   . Alcoholism Brother   . Lung cancer Sister      ROS:  Please see the history of present illness.  Patient complains of headaches, fevers, chills, nausea, diarrhea and general malaise. All other ROS reviewed and negative.     Physical Exam/Data:   Vitals:   01/25/19 1925 01/25/19 2000 01/26/19 0509 01/26/19 0810  BP:  (!) 121/45 (!) 143/74 105/83  Pulse:  88 (!) 146 (!) 143  Resp:    18  Temp: 99.3 F (37.4 C) (!) 100.8 F (38.2 C) (!) 103.2 F (39.6 C) 99 F (37.2 C)  TempSrc: Oral Oral Oral Oral  SpO2:  93% 91% 92%  Weight:      Height:        Intake/Output Summary (Last 24 hours) at 01/26/2019 1204 Last data filed at 01/26/2019 1115 Gross per 24 hour  Intake 1948.03 ml  Output 500 ml  Net 1448.03 ml   Last 3 Weights 01/25/2019 05/05/2016 04/23/2016  Weight (lbs) 160 lb 159 lb 159 lb  Weight (kg) 72.576 kg 72.122 kg 72.122 kg     Body mass index is 31.25 kg/m.   VITAL SIGNS:  reviewed  Patient alert and oriented. No acute distress Remainder physical examination not performed as this was a virtual visit due to coronavirus pandemic.  EKG:  The EKG was personally reviewed and demonstrates: Sinus tachycardia with PACs.  Follow-up electrocardiogram revealed atrial flutter with nonspecific ST changes. Telemetry:  Telemetry was personally reviewed and demonstrates: Atrial flutter followed by atrial fibrillation with rapid ventricular response.  Laboratory Data:  Chemistry Recent Labs  Lab 01/25/19 1411 01/25/19 2039 01/26/19 0745  NA 125* 128* 132*  K 3.8 4.0 3.6  CL 94* 99 102  CO2 20* 19* 17*  GLUCOSE 123* 109* 103*  BUN 39* 38* 37*  CREATININE 2.02* 1.66* 1.55*  CALCIUM 8.4* 7.8* 7.4*  GFRNONAA 22* 28* 31*  GFRAA 26* 33* 36*  ANIONGAP 11 10 13     Recent Labs  Lab 01/25/19 1411  PROT 6.3*  ALBUMIN 2.6*  AST 63*  ALT 42  ALKPHOS 83  BILITOT 1.0   Hematology Recent Labs  Lab 01/25/19 1411 01/25/19  2039  WBC 9.9 8.5  RBC 3.75* 3.57*  HGB 11.5* 11.0*  HCT 35.2* 33.0*  MCV 93.9 92.4  MCH 30.7 30.8  MCHC 32.7 33.3  RDW 14.1 14.1  PLT 154 143*   Radiology/Studies:  Dg Chest Port 1 View  Result Date: 01/25/2019 CLINICAL DATA:  Fever EXAM: PORTABLE CHEST 1 VIEW COMPARISON:  04/11/2016 FINDINGS: The heart size and mediastinal contours are within normal limits. Both lungs are clear. The  visualized skeletal structures are unremarkable. IMPRESSION: No active disease. Electronically Signed   By: Kathreen Devoid   On: 01/25/2019 16:43    Assessment and Plan:   1. New onset atrial fibrillation-patient initially had atrial flutter but now appears to be in atrial fibrillation.  This is likely related to stress of acute illness.  She is asymptomatic and denies palpitations, chest pain or dyspnea.  I agree with IV Cardizem and increase as needed for rate control.  I will add metoprolol 12.5 mg twice daily.  If blood pressure will not tolerate additional increases in calcium blocker or beta-blocker, amiodarone could be used. CHADSvasc 4.  I will treat with Lovenox.  Transition to apixaban when it is clear she will not require any procedures.  We could plan to proceed with cardioversion in 4 to 6 weeks once she recovers from present illness.  Check TSH. 2. Febrile illness-COVID 19 test pending.  She is also on antibiotics and blood cultures are pending.  Further management per primary care. 3. Acute kidney disease-likely from dehydration.  Continue IV fluids. 4. Hyponatremia-improving with hydration.  Continue to follow sodium.  For questions or updates, please contact [place] Please consult [email or web address] for contact info under     Signed, Kirk Ruths, MD  01/26/2019 12:04 PM

## 2019-01-27 ENCOUNTER — Inpatient Hospital Stay (HOSPITAL_COMMUNITY): Payer: PPO

## 2019-01-27 DIAGNOSIS — R7989 Other specified abnormal findings of blood chemistry: Secondary | ICD-10-CM

## 2019-01-27 DIAGNOSIS — I480 Paroxysmal atrial fibrillation: Secondary | ICD-10-CM

## 2019-01-27 DIAGNOSIS — J181 Lobar pneumonia, unspecified organism: Secondary | ICD-10-CM

## 2019-01-27 LAB — CBC WITH DIFFERENTIAL/PLATELET
Abs Immature Granulocytes: 0.11 10*3/uL — ABNORMAL HIGH (ref 0.00–0.07)
Basophils Absolute: 0 10*3/uL (ref 0.0–0.1)
Basophils Relative: 0 %
Eosinophils Absolute: 0 10*3/uL (ref 0.0–0.5)
Eosinophils Relative: 0 %
HCT: 30.9 % — ABNORMAL LOW (ref 36.0–46.0)
Hemoglobin: 10.6 g/dL — ABNORMAL LOW (ref 12.0–15.0)
Immature Granulocytes: 2 %
Lymphocytes Relative: 9 %
Lymphs Abs: 0.7 10*3/uL (ref 0.7–4.0)
MCH: 31 pg (ref 26.0–34.0)
MCHC: 34.3 g/dL (ref 30.0–36.0)
MCV: 90.4 fL (ref 80.0–100.0)
Monocytes Absolute: 0.3 10*3/uL (ref 0.1–1.0)
Monocytes Relative: 4 %
Neutro Abs: 6 10*3/uL (ref 1.7–7.7)
Neutrophils Relative %: 85 %
Platelets: UNDETERMINED 10*3/uL (ref 150–400)
RBC: 3.42 MIL/uL — ABNORMAL LOW (ref 3.87–5.11)
RDW: 14.9 % (ref 11.5–15.5)
WBC: 7.1 10*3/uL (ref 4.0–10.5)
nRBC: 0 % (ref 0.0–0.2)

## 2019-01-27 LAB — COMPREHENSIVE METABOLIC PANEL
ALT: 47 U/L — ABNORMAL HIGH (ref 0–44)
AST: 65 U/L — ABNORMAL HIGH (ref 15–41)
Albumin: 1.8 g/dL — ABNORMAL LOW (ref 3.5–5.0)
Alkaline Phosphatase: 80 U/L (ref 38–126)
Anion gap: 13 (ref 5–15)
BUN: 35 mg/dL — ABNORMAL HIGH (ref 8–23)
CO2: 17 mmol/L — ABNORMAL LOW (ref 22–32)
Calcium: 7.3 mg/dL — ABNORMAL LOW (ref 8.9–10.3)
Chloride: 101 mmol/L (ref 98–111)
Creatinine, Ser: 1.57 mg/dL — ABNORMAL HIGH (ref 0.44–1.00)
GFR calc Af Amer: 35 mL/min — ABNORMAL LOW (ref >60)
GFR calc non Af Amer: 30 mL/min — ABNORMAL LOW (ref >60)
Glucose, Bld: 97 mg/dL (ref 70–99)
Potassium: 3.7 mmol/L (ref 3.5–5.1)
Sodium: 131 mmol/L — ABNORMAL LOW (ref 135–145)
Total Bilirubin: 0.6 mg/dL (ref 0.3–1.2)
Total Protein: 5.2 g/dL — ABNORMAL LOW (ref 6.5–8.1)

## 2019-01-27 LAB — LACTIC ACID, PLASMA: Lactic Acid, Venous: 1.2 mmol/L (ref 0.5–1.9)

## 2019-01-27 LAB — PROCALCITONIN: Procalcitonin: 6.97 ng/mL

## 2019-01-27 LAB — NOVEL CORONAVIRUS, NAA (HOSP ORDER, SEND-OUT TO REF LAB; TAT 18-24 HRS): SARS-CoV-2, NAA: NOT DETECTED

## 2019-01-27 LAB — STREP PNEUMONIAE URINARY ANTIGEN: Strep Pneumo Urinary Antigen: NEGATIVE

## 2019-01-27 LAB — SARS CORONAVIRUS 2 BY RT PCR (HOSPITAL ORDER, PERFORMED IN ~~LOC~~ HOSPITAL LAB): SARS Coronavirus 2: NEGATIVE

## 2019-01-27 MED ORDER — DILTIAZEM HCL 60 MG PO TABS
60.0000 mg | ORAL_TABLET | Freq: Four times a day (QID) | ORAL | Status: AC
  Administered 2019-01-27 – 2019-01-29 (×10): 60 mg via ORAL
  Filled 2019-01-27 (×12): qty 1

## 2019-01-27 MED ORDER — LACTATED RINGERS IV SOLN
INTRAVENOUS | Status: AC
  Administered 2019-01-27 – 2019-01-28 (×2): via INTRAVENOUS

## 2019-01-27 NOTE — Progress Notes (Signed)
PROGRESS NOTE  Amanda Berry IHK:[id number] DOB: [birth year] DOA: 01/25/2019 PCP: Shirline Frees, MD  HPI/Recap of past 24 hours:  afib/aflutter Amanda Berry  Resolved, now back to normal sinus rhythm  Last fever 103 at 10pm yesterday, continue to have congested cough, no hypoxia at rest  No appetite   Assessment/Plan: Active Problems:   Sepsis ([place])   Acute respiratory failure with hypoxia (HCC)   AKI (acute kidney injury) ([place])   Suspected Covid-19 Virus Infection   Hyponatremia   PAF (paroxysmal atrial fibrillation) (HCC)  Sepsis presented on presentation with fever, lactic acidosis, tachycardia, tachypnea - respiratory viral panel negative,  repeat COVID testing negative, mrsa screen negative, blood culture negative, urine culture with multiple species, she denies urinary symptoms -procalcitonin 6.3-6.9, -lactic acid 2.4-1.2 -repeat cxr  -urine strep pneumo antigen, urine legionella antigen pending collection -no diarrhea since in the hospital -d/c vanc, continue cefepime for now -no diarrhea since in the hospital  Acute hypoxic respiratory failure, likely due to viral pneumonia/bronchitis, initial cxr no acute findings,repeat cxr today Baseline not o2 dependent, o2 dropped to 89 on room air on presentation, she is started on 2liter o2  AKI on CKDIII with anemia of chronic disease (hgb  Around 11) Urine culture with multiple species  Likely prerenal Improving on ivf Cr 2.1>1,6>1.55-1.57 renal dosing meds  Hyponatremia Sodium 125 on presentation, likely due to dehydration,  Improving on ivf  Elevated lft: (tbili, alk phos wnl, mild elevated ast/alt ) From sepsis?  Will get ab [place] , acute hepatitis panel and ck Trend lft  New onset of aflutter/afib Back to sinus rhythm with cardizem drip , now on oral cardizem and anticoagulation, cardiology input appreciated, case manager assistance for apixaban at discharge  HTN:  Was on lisinopril at home, lisinopril held on  presentation due to [facility] She is currently on cardizem, bp low normal Will monitor  Likely will discontinue lisinopril (due to renal impairment) and discharge on cardizem alone at discharge  Code Status: full  Family Communication: patient   Disposition Plan: not ready to discharge   Consultants:  cardiology  Procedures:  none  Antibiotics:  vanc on admission  Zosyn since admission    Objective: BP (!) 107/51 (BP Location: Left Arm)   Pulse 79   Temp 98.8 F (37.1 C) (Oral)   Resp 20   Ht 5' (1.524 m)   Wt 72.6 kg   SpO2 93%   BMI 31.25 kg/m   Intake/Output Summary (Last 24 hours) at 01/27/2019 1527 Last data filed at 01/26/2019 1844 Gross per 24 hour  Intake 850.32 ml  Output 400 ml  Net 450.32 ml   Filed Weights   01/25/19 1711  Weight: 72.6 kg    Exam: Patient is examined daily including today on 01/27/2019, exams remain the same as of yesterday except that has changed    General:  Weak but NAD, aaox3  Cardiovascular: RRR  Respiratory: + rhonchi   Abdomen: Soft/ND/NT, positive BS  Musculoskeletal: No Edema  Neuro: alert, oriented   Data Reviewed: Basic Metabolic Panel: Recent Labs  Lab 01/25/19 1411 01/25/19 2039 01/26/19 0745 01/27/19 0636  NA 125* 128* 132* 131*  K 3.8 4.0 3.6 3.7  CL 94* 99 102 101  CO2 20* 19* 17* 17*  GLUCOSE 123* 109* 103* 97  BUN 39* 38* 37* 35*  CREATININE 2.02* 1.66* 1.55* 1.57*  CALCIUM 8.4* 7.8* 7.4* 7.3*   Liver Function Tests: Recent Labs  Lab 01/25/19 1411 01/27/19 0636  AST  63* 65*  ALT 42 47*  ALKPHOS 83 80  BILITOT 1.0 0.6  PROT 6.3* 5.2*  ALBUMIN 2.6* 1.8*   No results for input(s): LIPASE, AMYLASE in the last 168 hours. No results for input(s): AMMONIA in the last 168 hours. CBC: Recent Labs  Lab 01/25/19 1411 01/25/19 2039 01/27/19 0636  WBC 9.9 8.5 7.1  NEUTROABS 9.2* 7.7 6.0  HGB 11.5* 11.0* 10.6*  HCT 35.2* 33.0* 30.9*  MCV 93.9 92.4 90.4  PLT 154 143* PLATELET CLUMPS  NOTED ON SMEAR, UNABLE TO ESTIMATE   Cardiac Enzymes:   No results for input(s): CKTOTAL, CKMB, CKMBINDEX, TROPONINI in the last 168 hours. BNP (last 3 results) No results for input(s): BNP in the last 8760 hours.  ProBNP (last 3 results) No results for input(s): PROBNP in the last 8760 hours.  CBG: No results for input(s): GLUCAP in the last 168 hours.  Recent Results (from the past 240 hour(s))  Blood culture (routine x 2)     Status: None (Preliminary result)   Collection Time: 01/25/19  2:11 PM  Result Value Ref Range Status   Specimen Description BLOOD RIGHT ANTECUBITAL  Final   Special Requests   Final    BOTTLES DRAWN AEROBIC AND ANAEROBIC Blood Culture adequate volume   Culture   Final    NO GROWTH 2 DAYS Performed at [facility], [address] [place], [place] [address]    Report Status PENDING  Incomplete  Blood culture (routine x 2)     Status: None (Preliminary result)   Collection Time: 01/25/19  2:16 PM  Result Value Ref Range Status   Specimen Description BLOOD LEFT ANTECUBITAL  Final   Special Requests   Final    AEROBIC BOTTLE ONLY Blood Culture results may not be optimal due to an inadequate volume of blood received in culture bottles   Culture   Final    NO GROWTH 2 DAYS Performed at [facility], [place] [address] [place], [place] [address]    Report Status PENDING  Incomplete  SARS Coronavirus 2 (CEPHEID- Performed in [place] hospital lab), Hosp Order     Status: None   Collection Time: 01/25/19  2:57 PM  Result Value Ref Range Status   SARS Coronavirus 2 NEGATIVE NEGATIVE Final    Comment: (NOTE) If result is NEGATIVE SARS-CoV-2 target nucleic acids are NOT DETECTED. The SARS-CoV-2 RNA is generally detectable in upper and lower  respiratory specimens during the acute phase of infection. The lowest  concentration of SARS-CoV-2 viral copies this assay can detect is 250  copies / mL. A negative result does not preclude SARS-CoV-2  infection  and should not be used as the sole basis for treatment or other  patient management decisions.  A negative result may occur with  improper specimen collection / handling, submission of specimen other  than nasopharyngeal swab, presence of viral mutation(s) within the  areas targeted by this assay, and inadequate number of viral copies  (<250 copies / mL). A negative result must be combined with clinical  observations, patient history, and epidemiological information. If result is POSITIVE SARS-CoV-2 target nucleic acids are DETECTED. The SARS-CoV-2 RNA is generally detectable in upper and lower  respiratory specimens dur ing the acute phase of infection.  Positive  results are indicative of active infection with SARS-CoV-2.  Clinical  correlation with patient history and other diagnostic information is  necessary to determine patient infection status.  Positive results do  not  rule out bacterial infection or co-infection with other viruses. If result is PRESUMPTIVE POSTIVE SARS-CoV-2 nucleic acids MAY BE PRESENT.   A presumptive positive result was obtained on the submitted specimen  and confirmed on repeat testing.  While 2019 novel coronavirus  (SARS-CoV-2) nucleic acids may be present in the submitted sample  additional confirmatory testing may be necessary for epidemiological  and / or clinical management purposes  to differentiate between  SARS-CoV-2 and other Sarbecovirus currently known to infect humans.  If clinically indicated additional testing with an alternate test  methodology [phone]) is advised. The SARS-CoV-2 RNA is generally  detectable in upper and lower respiratory sp ecimens during the acute  phase of infection. The expected result is Negative. Fact Sheet for Patients:  StrictlyIdeas.no Fact Sheet for Healthcare Providers: BankingDealers.co.za This test is not yet approved or cleared by the [place]  FDA and has been authorized for detection and/or diagnosis of SARS-CoV-2 by FDA under an Emergency Use Authorization (EUA).  This EUA will remain in effect (meaning this test can be used) for the duration of the COVID-19 declaration under Section 564(b)(1) of the Act, 21 U.S.C. section 360bbb-3(b)(1), unless the authorization is terminated or revoked sooner. Performed at [facility], [place] [address], [place], [place] [address]   Urine culture     Status: Abnormal   Collection Time: 01/25/19  5:30 PM  Result Value Ref Range Status   Specimen Description URINE, RANDOM  Final   Special Requests   Final    NONE Performed at [facility], [place] [address], [place], [place] [address]    Culture MULTIPLE SPECIES PRESENT, SUGGEST RECOLLECTION (A)  Final   Report Status 01/26/2019 FINAL  Final  Novel Coronavirus, NAA (hospital order; send-out to ref lab)     Status: None   Collection Time: 01/25/19  6:49 PM  Result Value Ref Range Status   SARS-CoV-2, NAA NOT DETECTED NOT DETECTED Final    Comment: (NOTE) This test was developed and its performance characteristics determined by [employer]. This test has not been FDA cleared or approved. This test has been authorized by FDA under an Emergency Use Authorization (EUA). This test is only authorized for the duration of time the declaration that circumstances exist justifying the authorization of the emergency use of in vitro diagnostic tests for detection of SARS-CoV-2 virus and/or diagnosis of COVID-19 infection under section 564(b)(1) of the Act, 21 U.S.C. 188CZY-6(A)(6), unless the authorization is terminated or revoked sooner. When diagnostic testing is negative, the possibility of a false negative result should be considered in the context of a patient's recent exposures and the presence of clinical signs and symptoms consistent with COVID-19. An individual without symptoms of COVID-19 and who is not shedding  SARS-CoV-2 virus would expect to have a negative (not detected) result in this assay. Performed  At: [facility] [address] [place], [place] [id number] Rush Farmer MD AT:[id number]    [place]  Final    Comment: Performed at [facility], [place] [address] [place],  [address]  MRSA PCR Screening     Status: None   Collection Time: 01/25/19  9:27 PM  Result Value Ref Range Status   MRSA by PCR NEGATIVE NEGATIVE Final    Comment:        The GeneXpert MRSA Assay (FDA approved for NASAL specimens only), is one component of a comprehensive MRSA colonization surveillance program. It is not intended to diagnose MRSA infection  nor to guide or monitor treatment for MRSA infections. Performed at [facility], [place] [address], [place], [place] [address]   Respiratory Panel by PCR     Status: None   Collection Time: 01/26/19  7:29 AM  Result Value Ref Range Status   Adenovirus NOT DETECTED NOT DETECTED Final   Coronavirus 229E NOT DETECTED NOT DETECTED Final    Comment: (NOTE) The Coronavirus on the Respiratory Panel, DOES NOT test for the novel  Coronavirus (2019 nCoV)    Coronavirus HKU1 NOT DETECTED NOT DETECTED Final   Coronavirus NL63 NOT DETECTED NOT DETECTED Final   Coronavirus OC43 NOT DETECTED NOT DETECTED Final   Metapneumovirus NOT DETECTED NOT DETECTED Final   Rhinovirus / Enterovirus NOT DETECTED NOT DETECTED Final   Influenza A NOT DETECTED NOT DETECTED Final   Influenza B NOT DETECTED NOT DETECTED Final   Parainfluenza Virus 1 NOT DETECTED NOT DETECTED Final   Parainfluenza Virus 2 NOT DETECTED NOT DETECTED Final   Parainfluenza Virus 3 NOT DETECTED NOT DETECTED Final   Parainfluenza Virus 4 NOT DETECTED NOT DETECTED Final   Respiratory Syncytial Virus NOT DETECTED NOT DETECTED Final   Bordetella pertussis NOT DETECTED NOT DETECTED Final   Chlamydophila pneumoniae NOT DETECTED NOT DETECTED Final   Mycoplasma  pneumoniae NOT DETECTED NOT DETECTED Final    Comment: Performed at [facility], [place] [address], [place], [place] [address]  SARS Coronavirus 2 (CEPHEID- Performed in [place] hospital lab), Hosp Order     Status: None   Collection Time: 01/27/19  1:24 PM  Result Value Ref Range Status   SARS Coronavirus 2 NEGATIVE NEGATIVE Final    Comment: (NOTE) If result is NEGATIVE SARS-CoV-2 target nucleic acids are NOT DETECTED. The SARS-CoV-2 RNA is generally detectable in upper and lower  respiratory specimens during the acute phase of infection. The lowest  concentration of SARS-CoV-2 viral copies this assay can detect is 250  copies / mL. A negative result does not preclude SARS-CoV-2 infection  and should not be used as the sole basis for treatment or other  patient management decisions.  A negative result may occur with  improper specimen collection / handling, submission of specimen other  than nasopharyngeal swab, presence of viral mutation(s) within the  areas targeted by this assay, and inadequate number of viral copies  (<250 copies / mL). A negative result must be combined with clinical  observations, patient history, and epidemiological information. If result is POSITIVE SARS-CoV-2 target nucleic acids are DETECTED. The SARS-CoV-2 RNA is generally detectable in upper and lower  respiratory specimens dur ing the acute phase of infection.  Positive  results are indicative of active infection with SARS-CoV-2.  Clinical  correlation with patient history and other diagnostic information is  necessary to determine patient infection status.  Positive results do  not rule out bacterial infection or co-infection with other viruses. If result is PRESUMPTIVE POSTIVE SARS-CoV-2 nucleic acids MAY BE PRESENT.   A presumptive positive result was obtained on the submitted specimen  and confirmed on repeat testing.  While 2019 novel coronavirus  (SARS-CoV-2) nucleic acids may be present  in the submitted sample  additional confirmatory testing may be necessary for epidemiological  and / or clinical management purposes  to differentiate between  SARS-CoV-2 and other Sarbecovirus currently known to infect humans.  If clinically indicated additional testing with an alternate test  methodology [phone]) is advised. The SARS-CoV-2 RNA is generally  detectable in upper and  lower respiratory sp ecimens during the acute  phase of infection. The expected result is Negative. Fact Sheet for Patients:  StrictlyIdeas.no Fact Sheet for Healthcare Providers: BankingDealers.co.za This test is not yet approved or cleared by the [place] FDA and has been authorized for detection and/or diagnosis of SARS-CoV-2 by FDA under an Emergency Use Authorization (EUA).  This EUA will remain in effect (meaning this test can be used) for the duration of the COVID-19 declaration under Section 564(b)(1) of the Act, 21 U.S.C. section 360bbb-3(b)(1), unless the authorization is terminated or revoked sooner. Performed at [facility], [place] [address] [place], [place] [address]      Studies: No results found.  Scheduled Meds: . albuterol  2 puff Inhalation Q6H  . diltiazem  60 mg Oral Q6H  . enoxaparin (LOVENOX) injection  75 mg Subcutaneous Q24H  . famotidine  20 mg Oral Daily  . guaiFENesin  1,200 mg Oral BID  . oxybutynin  5 mg Oral QHS    Continuous Infusions: . sodium chloride 75 mL/hr at 01/27/19 0246  . ceFEPime (MAXIPIME) IV 2 g (01/26/19 1727)     Time spent: 86mns I have personally reviewed and interpreted on  01/27/2019 daily labs, tele strips, imagings as discussed above under date review session and assessment and plans.  I reviewed all nursing notes, pharmacy notes, consultant notes,  vitals, pertinent old records  I have discussed plan of care as described above with RN , patient  on 01/27/2019   FFlorencia ReasonsMD, PhD   [facility] Pager [phone] If 7PM-7AM, please contact night-coverage at [email or web address], password [email or web address]5/30/2020, 3:27 PM  LOS: 2 days

## 2019-01-27 NOTE — Plan of Care (Signed)

## 2019-01-27 NOTE — Progress Notes (Signed)
Patient's heart rate has been >65, but <90 BPM for approximately 2 hours.  This RN called pharmacy and informed them of current heart rate.  Pharmacy will change IV cardizem to PO.  Current IV rate is 10 mg/hr.  PO dose will be 60 mg every 6 hours.  Infusion should be stopped 1-2 hours after 1st PO dose.

## 2019-01-27 NOTE — Progress Notes (Signed)
[facility] will sign off.   Medication Recommendations:  Transition diltiazem to long-acting 120 mg daily at time of dc. If no procedures, start apixaban 5 mg bid. Other recommendations (labs, testing, etc):  None Follow up as an outpatient:  I will arrange Discussed with Dr. Erlinda Hong

## 2019-01-28 ENCOUNTER — Inpatient Hospital Stay (HOSPITAL_COMMUNITY): Payer: PPO

## 2019-01-28 DIAGNOSIS — A4189 Other specified sepsis: Secondary | ICD-10-CM

## 2019-01-28 LAB — CBC WITH DIFFERENTIAL/PLATELET
Abs Immature Granulocytes: 0.26 10*3/uL — ABNORMAL HIGH (ref 0.00–0.07)
Basophils Absolute: 0 10*3/uL (ref 0.0–0.1)
Basophils Relative: 0 %
Eosinophils Absolute: 0 10*3/uL (ref 0.0–0.5)
Eosinophils Relative: 0 %
HCT: 29.6 % — ABNORMAL LOW (ref 36.0–46.0)
Hemoglobin: 9.9 g/dL — ABNORMAL LOW (ref 12.0–15.0)
Immature Granulocytes: 3 %
Lymphocytes Relative: 7 %
Lymphs Abs: 0.6 10*3/uL — ABNORMAL LOW (ref 0.7–4.0)
MCH: 30.2 pg (ref 26.0–34.0)
MCHC: 33.4 g/dL (ref 30.0–36.0)
MCV: 90.2 fL (ref 80.0–100.0)
Monocytes Absolute: 0.5 10*3/uL (ref 0.1–1.0)
Monocytes Relative: 5 %
Neutro Abs: 8.4 10*3/uL — ABNORMAL HIGH (ref 1.7–7.7)
Neutrophils Relative %: 85 %
Platelets: 228 10*3/uL (ref 150–400)
RBC: 3.28 MIL/uL — ABNORMAL LOW (ref 3.87–5.11)
RDW: 15.1 % (ref 11.5–15.5)
WBC: 9.8 10*3/uL (ref 4.0–10.5)
nRBC: 0 % (ref 0.0–0.2)

## 2019-01-28 LAB — COMPREHENSIVE METABOLIC PANEL
ALT: 46 U/L — ABNORMAL HIGH (ref 0–44)
AST: 70 U/L — ABNORMAL HIGH (ref 15–41)
Albumin: 1.7 g/dL — ABNORMAL LOW (ref 3.5–5.0)
Alkaline Phosphatase: 112 U/L (ref 38–126)
Anion gap: 13 (ref 5–15)
BUN: 28 mg/dL — ABNORMAL HIGH (ref 8–23)
CO2: 16 mmol/L — ABNORMAL LOW (ref 22–32)
Calcium: 7.5 mg/dL — ABNORMAL LOW (ref 8.9–10.3)
Chloride: 102 mmol/L (ref 98–111)
Creatinine, Ser: 1.37 mg/dL — ABNORMAL HIGH (ref 0.44–1.00)
GFR calc Af Amer: 41 mL/min — ABNORMAL LOW (ref >60)
GFR calc non Af Amer: 36 mL/min — ABNORMAL LOW (ref >60)
Glucose, Bld: 93 mg/dL (ref 70–99)
Potassium: 3.5 mmol/L (ref 3.5–5.1)
Sodium: 131 mmol/L — ABNORMAL LOW (ref 135–145)
Total Bilirubin: 1 mg/dL (ref 0.3–1.2)
Total Protein: 5.4 g/dL — ABNORMAL LOW (ref 6.5–8.1)

## 2019-01-28 LAB — CK: Total CK: 93 U/L (ref 38–234)

## 2019-01-28 LAB — PROCALCITONIN: Procalcitonin: 4.22 ng/mL

## 2019-01-28 MED ORDER — SENNOSIDES-DOCUSATE SODIUM 8.6-50 MG PO TABS
1.0000 | ORAL_TABLET | Freq: Two times a day (BID) | ORAL | Status: DC
  Administered 2019-01-28 – 2019-01-31 (×6): 1 via ORAL
  Filled 2019-01-28 (×6): qty 1

## 2019-01-28 MED ORDER — LACTATED RINGERS IV SOLN: INTRAVENOUS | Status: AC

## 2019-01-28 NOTE — Plan of Care (Signed)
  Problem: Nutrition: Goal: Adequate nutrition will be maintained Outcome: Progressing   Problem: Coping: Goal: Level of anxiety will decrease Outcome: Progressing   Problem: Pain Managment: Goal: General experience of comfort will improve Outcome: Progressing   

## 2019-01-28 NOTE — Discharge Instructions (Addendum)
YOUR CARDIOLOGY TEAM HAS ARRANGED FOR AN E-VISIT FOR YOUR APPOINTMENT - PLEASE REVIEW IMPORTANT INFORMATION BELOW SEVERAL DAYS PRIOR TO YOUR APPOINTMENT  Due to the recent COVID-19 pandemic, we are transitioning in-person office visits to tele-medicine visits in an effort to decrease unnecessary exposure to our patients, their families, and staff. These visits are billed to your insurance just like a normal visit is. We also encourage you to sign up for MyChart if you have not already done so. You will need a smartphone if possible. For patients that do not have this, we can still complete the visit using a regular telephone but do prefer a smartphone to enable video when possible. You may have a family member that lives with you that can help. If possible, we also ask that you have a blood pressure cuff and scale at home to measure your blood pressure, heart rate and weight prior to your scheduled appointment. Patients with clinical needs that need an in-person evaluation and testing will still be able to come to the office if absolutely necessary. If you have any questions, feel free to call our office.   2-3 DAYS BEFORE YOUR APPOINTMENT  You will receive a telephone call from one of our [facility] [place] team members - your caller ID may say "Unknown caller." If this is a video visit, we will walk you through how to get the video launched on your phone. We will remind you check your blood pressure, heart rate and weight prior to your scheduled appointment. If you have an Apple Watch or Kardia, please upload any pertinent ECG strips the day before or morning of your appointment to [facility]. Our staff will also make sure you have reviewed the consent and agree to move forward with your scheduled tele-health visit.     THE DAY OF YOUR APPOINTMENT  Approximately 15 minutes prior to your scheduled appointment, you will receive a telephone call from one of [facility] team - your caller ID may say "Unknown caller."   Our staff will confirm medications, vital signs for the day and any symptoms you may be experiencing. Please have this information available prior to the time of visit start. It may also be helpful for you to have a pad of paper and pen handy for any instructions given during your visit. They will also walk you through joining the smartphone meeting if this is a video visit.    CONSENT FOR TELE-HEALTH VISIT - PLEASE REVIEW  I hereby voluntarily request, consent and authorize [facility] and its employed or contracted physicians, physician assistants, nurse practitioners or other licensed health care professionals (the Practitioner), to provide me with telemedicine health care services (the Services") as deemed necessary by the treating Practitioner. I acknowledge and consent to receive the Services by the Practitioner via telemedicine. I understand that the telemedicine visit will involve communicating with the Practitioner through live audiovisual communication technology and the disclosure of certain medical information by electronic transmission. I acknowledge that I have been given the opportunity to request an in-person assessment or other available alternative prior to the telemedicine visit and am voluntarily participating in the telemedicine visit.  I understand that I have the right to withhold or withdraw my consent to the use of telemedicine in the course of my care at any time, without affecting my right to future care or treatment, and that the Practitioner or I may terminate the telemedicine visit at any time. I understand that I have the right to inspect all information obtained  and/or recorded in the course of the telemedicine visit and may receive copies of available information for a reasonable fee.  I understand that some of the potential risks of receiving the Services via telemedicine include:   Delay or interruption in medical evaluation due to technological equipment failure or  disruption;  Information transmitted may not be sufficient (e.g. poor resolution of images) to allow for appropriate medical decision making by the Practitioner; and/or   In rare instances, security protocols could fail, causing a breach of personal health information.  Furthermore, I acknowledge that it is my responsibility to provide information about my medical history, conditions and care that is complete and accurate to the best of my ability. I acknowledge that Practitioner's advice, recommendations, and/or decision may be based on factors not within their control, such as incomplete or inaccurate data provided by me or distortions of diagnostic images or specimens that may result from electronic transmissions. I understand that the practice of medicine is not an exact science and that Practitioner makes no warranties or guarantees regarding treatment outcomes. I acknowledge that I will receive a copy of this consent concurrently upon execution via email to the email address I last provided but may also request a printed copy by calling the office of [facility].    I understand that my insurance will be billed for this visit.   I have read or had this consent read to me.  I understand the contents of this consent, which adequately explains the benefits and risks of the Services being provided via telemedicine.   I have been provided ample opportunity to ask questions regarding this consent and the Services and have had my questions answered to my satisfaction.  I give my informed consent for the services to be provided through the use of telemedicine in my medical care  By participating in this telemedicine visit I agree to the above.   Information on my medicine - ELIQUIS (apixaban)  Why was Eliquis prescribed for you? Eliquis was prescribed for you to reduce the risk of a blood clot forming that can cause a stroke if you have a medical condition called atrial fibrillation (a  type of irregular heartbeat).  What do You need to know about Eliquis ? Take your Eliquis TWICE DAILY - one tablet in the morning and one tablet in the evening with or without food. If you have difficulty swallowing the tablet whole please discuss with your pharmacist how to take the medication safely.  Take Eliquis exactly as prescribed by your doctor and DO NOT stop taking Eliquis without talking to the doctor who prescribed the medication.  Stopping may increase your risk of developing a stroke.  Refill your prescription before you run out.  After discharge, you should have regular check-up appointments with your healthcare provider that is prescribing your Eliquis.  In the future your dose may need to be changed if your kidney function or weight changes by a significant amount or as you get older.  What do you do if you miss a dose? If you miss a dose, take it as soon as you remember on the same day and resume taking twice daily.  Do not take more than one dose of ELIQUIS at the same time to make up a missed dose.  Important Safety Information A possible side effect of Eliquis is bleeding. You should call your healthcare provider right away if you experience any of the following: ? Bleeding from an injury or your nose  that does not stop. ? Unusual colored urine (red or dark brown) or unusual colored stools (red or black). ? Unusual bruising for unknown reasons. ? A serious fall or if you hit your head (even if there is no bleeding).  Some medicines may interact with Eliquis and might increase your risk of bleeding or clotting while on Eliquis. To help avoid this, consult your healthcare provider or pharmacist prior to using any new prescription or non-prescription medications, including herbals, vitamins, non-steroidal anti-inflammatory drugs (NSAIDs) and supplements.  This website has more information on Eliquis (apixaban): http://[email or web address].[email or web address]/home

## 2019-01-28 NOTE — Progress Notes (Signed)
PROGRESS NOTE  YARELY BEBEE DVV:[id number] DOB: 21-Apr-1934 DOA: 01/25/2019 PCP: Shirline Frees, MD  Brief History:   H/o HTN presented with persistent high fever x5 days,  cough, sob, GI symptoms after attending church.   Admitted for sepsis of unclear etiology and acute hypoxic respiratory failure. Found to have new onset of afib. Pneumonia, improving, wean oxygen, ambulate, d/c in 1-2 days Negative SARSCOV2 x2.    HPI/Recap of past 24 hours:  afib/aflutter /RVR  Resolved, now back to normal sinus rhythm  Last fever 103 at 10pm  On 5/29, no fever last 24hrs, less congested cough, denies chest pain, no hypoxia at rest  She started to eat more   Assessment/Plan: Active Problems:   Sepsis ([place])   Acute respiratory failure with hypoxia (HCC)   AKI (acute kidney injury) ([place])   Suspected Covid-19 Virus Infection   Hyponatremia   PAF (paroxysmal atrial fibrillation) (HCC)   LFT elevation   Lobar pneumonia ([place])  Bilateral pneumonia, Sepsis presented on presentation with fever, lactic acidosis, tachycardia, tachypnea, Acute hypoxic respiratory failure - respiratory viral panel negative,  repeat COVID testing negative, mrsa screen negative, blood culture negative, urine culture with multiple species, she denies urinary symptoms ---initial cxr no acute findings, CT chest "1. Bilateral pneumonia with complete consolidation of the right lower lobe. 2. Small pleural effusions." -urine strep pneumo antigen negative,  urine legionella antigen in process, sputum sample pending collection -no diarrhea since in the hospital -procalcitonin 6.3-6.9-4.2, -lactic acid 2.4-1.2 -d/c vanc, continue cefepime for now -- -wean o2   AKI on CKDIII with anemia of chronic disease (hgb  Around 11) Urine culture with multiple species  Likely prerenal Improving on ivf Cr 2.1>1,6>1.55>1.57>1.37 renal dosing meds  Hyponatremia Sodium 125 on presentation, likely due to dehydration,  Improving on  ivf  Elevated lft: (tbili, alk phos wnl, mild elevated ast/alt ), ck wnl -From sepsis?  -ab [place] "Early cirrhotic change characterized by a nodular contour of the left lobe of the liver is suggested"  -acute hepatitis panel in process -Trend lft  New onset of aflutter/afib Back to sinus rhythm with cardizem drip , now on oral cardizem and anticoagulation, cardiology input appreciated, case manager assistance for apixaban at discharge Per cardiology "Transition diltiazem to long-acting 120 mg daily at time of dc. If no procedures, start apixaban 5 mg bid." Cardiology will arrange follow up.  HTN:  Was on lisinopril at home, lisinopril held on presentation due to [facility] She is currently on cardizem, bp low normal Will monitor  Likely will discontinue lisinopril (due to renal impairment) and discharge on cardizem alone at discharge  Code Status: full  Family Communication: patient   Disposition Plan: home n 1-2 days, pending clinical improvement, wean o2   Consultants:  cardiology  Procedures:  none  Antibiotics:  vanc on admission  cefepime since admission    Objective: BP (!) 150/63 (BP Location: Left Arm)    Pulse 99    Temp 97.9 F (36.6 C) (Oral)    Resp 20    Ht 5' (1.524 m)    Wt 72.6 kg    SpO2 94%    BMI 31.25 kg/m   Intake/Output Summary (Last 24 hours) at 01/28/2019 1407 Last data filed at 01/28/2019 1123 Gross per 24 hour  Intake 1420 ml  Output 500 ml  Net 920 ml   Filed Weights   01/25/19 1711  Weight: 72.6 kg    Exam: Patient is examined daily including today on 01/28/2019,  exams remain the same as of yesterday except that has changed    General:  Weak but NAD, aaox3  Cardiovascular: RRR  Respiratory: diminished on right lower lung fields, no wheezing, no rales  Abdomen: Soft/ND/NT, positive BS  Musculoskeletal: No Edema  Neuro: alert, oriented   Data Reviewed: Basic Metabolic Panel: Recent Labs  Lab 01/25/19 1411 01/25/19 2039  01/26/19 0745 01/27/19 0636 01/28/19 0706  NA 125* 128* 132* 131* 131*  K 3.8 4.0 3.6 3.7 3.5  CL 94* 99 102 101 102  CO2 20* 19* 17* 17* 16*  GLUCOSE 123* 109* 103* 97 93  BUN 39* 38* 37* 35* 28*  CREATININE 2.02* 1.66* 1.55* 1.57* 1.37*  CALCIUM 8.4* 7.8* 7.4* 7.3* 7.5*   Liver Function Tests: Recent Labs  Lab 01/25/19 1411 01/27/19 0636 01/28/19 0706  AST 63* 65* 70*  ALT 42 47* 46*  ALKPHOS 83 80 112  BILITOT 1.0 0.6 1.0  PROT 6.3* 5.2* 5.4*  ALBUMIN 2.6* 1.8* 1.7*   No results for input(s): LIPASE, AMYLASE in the last 168 hours. No results for input(s): AMMONIA in the last 168 hours. CBC: Recent Labs  Lab 01/25/19 1411 01/25/19 2039 01/27/19 0636 01/28/19 0706  WBC 9.9 8.5 7.1 9.8  NEUTROABS 9.2* 7.7 6.0 8.4*  HGB 11.5* 11.0* 10.6* 9.9*  HCT 35.2* 33.0* 30.9* 29.6*  MCV 93.9 92.4 90.4 90.2  PLT 154 143* PLATELET CLUMPS NOTED ON SMEAR, UNABLE TO ESTIMATE 228   Cardiac Enzymes:   Recent Labs  Lab 01/28/19 0706  CKTOTAL 93   BNP (last 3 results) No results for input(s): BNP in the last 8760 hours.  ProBNP (last 3 results) No results for input(s): PROBNP in the last 8760 hours.  CBG: No results for input(s): GLUCAP in the last 168 hours.  Recent Results (from the past 240 hour(s))  Blood culture (routine x 2)     Status: None (Preliminary result)   Collection Time: 01/25/19  2:11 PM  Result Value Ref Range Status   Specimen Description BLOOD RIGHT ANTECUBITAL  Final   Special Requests   Final    BOTTLES DRAWN AEROBIC AND ANAEROBIC Blood Culture adequate volume   Culture   Final    NO GROWTH 2 DAYS Performed at [facility], [address] [place], [place] [address]    Report Status PENDING  Incomplete  Blood culture (routine x 2)     Status: None (Preliminary result)   Collection Time: 01/25/19  2:16 PM  Result Value Ref Range Status   Specimen Description BLOOD LEFT ANTECUBITAL  Final   Special Requests   Final    AEROBIC BOTTLE ONLY  Blood Culture results may not be optimal due to an inadequate volume of blood received in culture bottles   Culture   Final    NO GROWTH 2 DAYS Performed at [facility], [place] [address], [place], [place] [address]    Report Status PENDING  Incomplete  SARS Coronavirus 2 (CEPHEID- Performed in [place] hospital lab), Hosp Order     Status: None   Collection Time: 01/25/19  2:57 PM  Result Value Ref Range Status   SARS Coronavirus 2 NEGATIVE NEGATIVE Final    Comment: (NOTE) If result is NEGATIVE SARS-CoV-2 target nucleic acids are NOT DETECTED. The SARS-CoV-2 RNA is generally detectable in upper and lower  respiratory specimens during the acute phase of infection. The lowest  concentration of SARS-CoV-2 viral copies this assay can detect is 250  copies / mL. A negative result does not preclude SARS-CoV-2 infection  and should not be used as the sole basis for treatment or other  patient management decisions.  A negative result may occur with  improper specimen collection / handling, submission of specimen other  than nasopharyngeal swab, presence of viral mutation(s) within the  areas targeted by this assay, and inadequate number of viral copies  (<250 copies / mL). A negative result must be combined with clinical  observations, patient history, and epidemiological information. If result is POSITIVE SARS-CoV-2 target nucleic acids are DETECTED. The SARS-CoV-2 RNA is generally detectable in upper and lower  respiratory specimens dur ing the acute phase of infection.  Positive  results are indicative of active infection with SARS-CoV-2.  Clinical  correlation with patient history and other diagnostic information is  necessary to determine patient infection status.  Positive results do  not rule out bacterial infection or co-infection with other viruses. If result is PRESUMPTIVE POSTIVE SARS-CoV-2 nucleic acids MAY BE PRESENT.   A presumptive positive result was obtained on  the submitted specimen  and confirmed on repeat testing.  While 2019 novel coronavirus  (SARS-CoV-2) nucleic acids may be present in the submitted sample  additional confirmatory testing may be necessary for epidemiological  and / or clinical management purposes  to differentiate between  SARS-CoV-2 and other Sarbecovirus currently known to infect humans.  If clinically indicated additional testing with an alternate test  methodology [phone]) is advised. The SARS-CoV-2 RNA is generally  detectable in upper and lower respiratory sp ecimens during the acute  phase of infection. The expected result is Negative. Fact Sheet for Patients:  StrictlyIdeas.no Fact Sheet for Healthcare Providers: BankingDealers.co.za This test is not yet approved or cleared by the [place] FDA and has been authorized for detection and/or diagnosis of SARS-CoV-2 by FDA under an Emergency Use Authorization (EUA).  This EUA will remain in effect (meaning this test can be used) for the duration of the COVID-19 declaration under Section 564(b)(1) of the Act, 21 U.S.C. section 360bbb-3(b)(1), unless the authorization is terminated or revoked sooner. Performed at [facility], [place] [address] [place], [place] [address]   Urine culture     Status: Abnormal   Collection Time: 01/25/19  5:30 PM  Result Value Ref Range Status   Specimen Description URINE, RANDOM  Final   Special Requests   Final    NONE Performed at [facility], [place] [address] [place], [place] [address]    Culture MULTIPLE SPECIES PRESENT, SUGGEST RECOLLECTION (A)  Final   Report Status 01/26/2019 FINAL  Final  Novel Coronavirus, NAA (hospital order; send-out to ref lab)     Status: None   Collection Time: 01/25/19  6:49 PM  Result Value Ref Range Status   SARS-CoV-2, NAA NOT DETECTED NOT DETECTED Final    Comment: (NOTE) This test was developed and its performance  characteristics determined by [employer]. This test has not been FDA cleared or approved. This test has been authorized by FDA under an Emergency Use Authorization (EUA). This test is only authorized for the duration of time the declaration that circumstances exist justifying the authorization of the emergency use of in vitro diagnostic tests for detection of SARS-CoV-2 virus and/or diagnosis of COVID-19 infection under section 564(b)(1) of the Act, 21 U.S.C. 283TDV-7(O)(1), unless the authorization is terminated or revoked sooner. When diagnostic testing is negative, the possibility of a false negative result should be considered in the  context of a patient's recent exposures and the presence of clinical signs and symptoms consistent with COVID-19. An individual without symptoms of COVID-19 and who is not shedding SARS-CoV-2 virus would expect to have a negative (not detected) result in this assay. Performed  At: [facility] [address] [place], [place] [id number] Rush Farmer MD QM:[id number]    [place]  Final    Comment: Performed at [facility], [place] [address] [place], [place] [address]  MRSA PCR Screening     Status: None   Collection Time: 01/25/19  9:27 PM  Result Value Ref Range Status   MRSA by PCR NEGATIVE NEGATIVE Final    Comment:        The GeneXpert MRSA Assay (FDA approved for NASAL specimens only), is one component of a comprehensive MRSA colonization surveillance program. It is not intended to diagnose MRSA infection nor to guide or monitor treatment for MRSA infections. Performed at [facility], [place] [address], [place], [place] [address]   Respiratory Panel by PCR     Status: None   Collection Time: 01/26/19  7:29 AM  Result Value Ref Range Status   Adenovirus NOT DETECTED NOT DETECTED Final   Coronavirus 229E NOT DETECTED NOT DETECTED Final    Comment: (NOTE) The Coronavirus on the  Respiratory Panel, DOES NOT test for the novel  Coronavirus (2019 nCoV)    Coronavirus HKU1 NOT DETECTED NOT DETECTED Final   Coronavirus NL63 NOT DETECTED NOT DETECTED Final   Coronavirus OC43 NOT DETECTED NOT DETECTED Final   Metapneumovirus NOT DETECTED NOT DETECTED Final   Rhinovirus / Enterovirus NOT DETECTED NOT DETECTED Final   Influenza A NOT DETECTED NOT DETECTED Final   Influenza B NOT DETECTED NOT DETECTED Final   Parainfluenza Virus 1 NOT DETECTED NOT DETECTED Final   Parainfluenza Virus 2 NOT DETECTED NOT DETECTED Final   Parainfluenza Virus 3 NOT DETECTED NOT DETECTED Final   Parainfluenza Virus 4 NOT DETECTED NOT DETECTED Final   Respiratory Syncytial Virus NOT DETECTED NOT DETECTED Final   Bordetella pertussis NOT DETECTED NOT DETECTED Final   Chlamydophila pneumoniae NOT DETECTED NOT DETECTED Final   Mycoplasma pneumoniae NOT DETECTED NOT DETECTED Final    Comment: Performed at [facility], [place]. [address], [place], [place] [address]  SARS Coronavirus 2 (CEPHEID- Performed in [place] hospital lab), Hosp Order     Status: None   Collection Time: 01/27/19  1:24 PM  Result Value Ref Range Status   SARS Coronavirus 2 NEGATIVE NEGATIVE Final    Comment: (NOTE) If result is NEGATIVE SARS-CoV-2 target nucleic acids are NOT DETECTED. The SARS-CoV-2 RNA is generally detectable in upper and lower  respiratory specimens during the acute phase of infection. The lowest  concentration of SARS-CoV-2 viral copies this assay can detect is 250  copies / mL. A negative result does not preclude SARS-CoV-2 infection  and should not be used as the sole basis for treatment or other  patient management decisions.  A negative result may occur with  improper specimen collection / handling, submission of specimen other  than nasopharyngeal swab, presence of viral mutation(s) within the  areas targeted by this assay, and inadequate number of viral copies  (<250 copies / mL). A  negative result must be combined with clinical  observations, patient history, and epidemiological information. If result is POSITIVE SARS-CoV-2 target nucleic acids are DETECTED. The SARS-CoV-2 RNA is generally detectable in upper and lower  respiratory specimens dur ing the acute phase of infection.  Positive  results are indicative of active infection with SARS-CoV-2.  Clinical  correlation with patient history and other diagnostic information is  necessary to determine patient infection status.  Positive results do  not rule out bacterial infection or co-infection with other viruses. If result is PRESUMPTIVE POSTIVE SARS-CoV-2 nucleic acids MAY BE PRESENT.   A presumptive positive result was obtained on the submitted specimen  and confirmed on repeat testing.  While 2019 novel coronavirus  (SARS-CoV-2) nucleic acids may be present in the submitted sample  additional confirmatory testing may be necessary for epidemiological  and / or clinical management purposes  to differentiate between  SARS-CoV-2 and other Sarbecovirus currently known to infect humans.  If clinically indicated additional testing with an alternate test  methodology [phone]) is advised. The SARS-CoV-2 RNA is generally  detectable in upper and lower respiratory sp ecimens during the acute  phase of infection. The expected result is Negative. Fact Sheet for Patients:  StrictlyIdeas.no Fact Sheet for Healthcare Providers: BankingDealers.co.za This test is not yet approved or cleared by the [place] FDA and has been authorized for detection and/or diagnosis of SARS-CoV-2 by FDA under an Emergency Use Authorization (EUA).  This EUA will remain in effect (meaning this test can be used) for the duration of the COVID-19 declaration under Section 564(b)(1) of the Act, 21 U.S.C. section 360bbb-3(b)(1), unless the authorization is terminated or revoked sooner. Performed  at [facility], [place] [address] [place], [place] [address]      Studies: Ct Chest Wo Contrast  Result Date: 01/27/2019 CLINICAL DATA:  Pneumonia, unresolved for complicated EXAM: CT CHEST WITHOUT CONTRAST TECHNIQUE: Multidetector CT imaging of the chest was performed following the standard protocol without IV contrast. COMPARISON:  Chest x-ray from earlier today FINDINGS: Cardiovascular: Normal heart size. No pericardial effusion. There is extensive aortic and coronary atherosclerosis. Mediastinum/Nodes: Negative for adenopathy or mass. Lungs/Pleura: Dense consolidation without volume loss in the right lower lobe. Airspace opacity with volume loss in the left lower lobe. Small pleural effusions on both sides. Upper Abdomen: Negative Musculoskeletal: Generalized spinal degeneration with focally advanced disc degeneration at T12-L1. IMPRESSION: 1. Bilateral pneumonia with complete consolidation of the right lower lobe. 2. Small pleural effusions. Electronically Signed   By: Monte Fantasia M.D.   On: 01/27/2019 17:45   Dg Chest Port 1 View  Result Date: 01/27/2019 CLINICAL DATA:  Cough, hypertension EXAM: PORTABLE CHEST 1 VIEW COMPARISON:  01/25/2019 FINDINGS: Normal heart size and vascularity. Left lung remains clear. Increased right basilar opacity, suspect right lower lobe airspace process and developing posterior layering effusion. Difficult to exclude right basilar pneumonia. Negative for edema or pneumothorax. Trachea midline. Aorta atherosclerotic. Degenerative changes of the spine. Bones are osteopenic. IMPRESSION: Right basilar opacity, suspect right lower lobe airspace process and developing right posterior layering effusion. Difficult to exclude pneumonia. Aortic atherosclerosis Electronically Signed   By: Jerilynn Mages.  Shick M.D.   On: 01/27/2019 16:49   US Abdomen Limited Ruq  Result Date: 01/28/2019 CLINICAL DATA:  Elevated liver function tests EXAM: ULTRASOUND ABDOMEN LIMITED RIGHT UPPER  QUADRANT COMPARISON:  None. FINDINGS: Gallbladder: No gallstones or wall thickening visualized. No sonographic Murphy sign noted by sonographer. A small amount of pericholecystic fluid is nonspecific. Common bile duct: Diameter: 4 mm Liver: Contour of the left lobe is somewhat nodular suggesting early cirrhotic change. No focal mass. Cortical echogenicity is within normal limits. Portal vein is patent on color Doppler  imaging with normal direction of blood flow towards the liver. Additional findings: Right pleural effusion is noted IMPRESSION: Nonspecific pericholecystic fluid. This can be seen with volume overload states. Early cirrhotic change characterized by a nodular contour of the left lobe of the liver is suggested. Right pleural effusion. Electronically Signed   By: Marybelle Killings M.D.   On: 01/28/2019 07:59    Scheduled Meds:  albuterol  2 puff Inhalation Q6H   diltiazem  60 mg Oral Q6H   enoxaparin (LOVENOX) injection  75 mg Subcutaneous Q24H   famotidine  20 mg Oral Daily   guaiFENesin  1,200 mg Oral BID   oxybutynin  5 mg Oral QHS    Continuous Infusions:  ceFEPime (MAXIPIME) IV 2 g (01/27/19 1638)   lactated ringers 75 mL/hr at 01/28/19 1123     Time spent: 51mns I have personally reviewed and interpreted on  01/28/2019 daily labs, tele strips, imagings as discussed above under date review session and assessment and plans.  I reviewed all nursing notes, pharmacy notes, consultant notes,  vitals, pertinent old records  I have discussed plan of care as described above with RN , patient  on 01/28/2019   FFlorencia ReasonsMD, PhD  [facility] Pager [phone] If 7PM-7AM, please contact night-coverage at [email or web address], password [email or web address]5/31/2020, 2:07 PM  LOS: 3 days

## 2019-01-29 ENCOUNTER — Inpatient Hospital Stay (HOSPITAL_COMMUNITY): Payer: PPO

## 2019-01-29 ENCOUNTER — Telehealth: Payer: Self-pay | Admitting: Cardiology

## 2019-01-29 DIAGNOSIS — J9601 Acute respiratory failure with hypoxia: Secondary | ICD-10-CM

## 2019-01-29 DIAGNOSIS — R945 Abnormal results of liver function studies: Secondary | ICD-10-CM

## 2019-01-29 DIAGNOSIS — R0600 Dyspnea, unspecified: Secondary | ICD-10-CM

## 2019-01-29 LAB — CBC WITH DIFFERENTIAL/PLATELET
Abs Immature Granulocytes: 0.52 10*3/uL — ABNORMAL HIGH (ref 0.00–0.07)
Basophils Absolute: 0 10*3/uL (ref 0.0–0.1)
Basophils Relative: 0 %
Eosinophils Absolute: 0.1 10*3/uL (ref 0.0–0.5)
Eosinophils Relative: 1 %
HCT: 33.6 % — ABNORMAL LOW (ref 36.0–46.0)
Hemoglobin: 11.4 g/dL — ABNORMAL LOW (ref 12.0–15.0)
Immature Granulocytes: 5 %
Lymphocytes Relative: 8 %
Lymphs Abs: 0.9 10*3/uL (ref 0.7–4.0)
MCH: 30.7 pg (ref 26.0–34.0)
MCHC: 33.9 g/dL (ref 30.0–36.0)
MCV: 90.6 fL (ref 80.0–100.0)
Monocytes Absolute: 0.6 10*3/uL (ref 0.1–1.0)
Monocytes Relative: 6 %
Neutro Abs: 8.9 10*3/uL — ABNORMAL HIGH (ref 1.7–7.7)
Neutrophils Relative %: 80 %
Platelets: 267 10*3/uL (ref 150–400)
RBC: 3.71 MIL/uL — ABNORMAL LOW (ref 3.87–5.11)
RDW: 15.4 % (ref 11.5–15.5)
WBC: 11.1 10*3/uL — ABNORMAL HIGH (ref 4.0–10.5)
nRBC: 0 % (ref 0.0–0.2)

## 2019-01-29 LAB — COMPREHENSIVE METABOLIC PANEL
ALT: 55 U/L — ABNORMAL HIGH (ref 0–44)
AST: 85 U/L — ABNORMAL HIGH (ref 15–41)
Albumin: 1.9 g/dL — ABNORMAL LOW (ref 3.5–5.0)
Alkaline Phosphatase: 148 U/L — ABNORMAL HIGH (ref 38–126)
Anion gap: 11 (ref 5–15)
BUN: 25 mg/dL — ABNORMAL HIGH (ref 8–23)
CO2: 17 mmol/L — ABNORMAL LOW (ref 22–32)
Calcium: 7.8 mg/dL — ABNORMAL LOW (ref 8.9–10.3)
Chloride: 104 mmol/L (ref 98–111)
Creatinine, Ser: 1.33 mg/dL — ABNORMAL HIGH (ref 0.44–1.00)
GFR calc Af Amer: 43 mL/min — ABNORMAL LOW (ref >60)
GFR calc non Af Amer: 37 mL/min — ABNORMAL LOW (ref >60)
Glucose, Bld: 89 mg/dL (ref 70–99)
Potassium: 3.5 mmol/L (ref 3.5–5.1)
Sodium: 132 mmol/L — ABNORMAL LOW (ref 135–145)
Total Bilirubin: 0.9 mg/dL (ref 0.3–1.2)
Total Protein: 5.6 g/dL — ABNORMAL LOW (ref 6.5–8.1)

## 2019-01-29 LAB — ECHOCARDIOGRAM COMPLETE
Height: 60 in
Weight: 2560 oz

## 2019-01-29 LAB — HEPATITIS PANEL, ACUTE
HCV Ab: <0.1 s/co ratio (ref 0.0–0.9)
Hep A IgM: NEGATIVE
Hep B C IgM: NEGATIVE
Hepatitis B Surface Ag: NEGATIVE

## 2019-01-29 MED ORDER — DILTIAZEM HCL ER COATED BEADS 120 MG PO CP24
120.0000 mg | ORAL_CAPSULE | Freq: Every day | ORAL | Status: DC
  Administered 2019-01-30 – 2019-01-31 (×2): 120 mg via ORAL
  Filled 2019-01-29 (×2): qty 1

## 2019-01-29 MED ORDER — APIXABAN 5 MG PO TABS
5.0000 mg | ORAL_TABLET | Freq: Two times a day (BID) | ORAL | Status: DC
  Administered 2019-01-29 – 2019-01-31 (×5): 5 mg via ORAL
  Filled 2019-01-29 (×5): qty 1

## 2019-01-29 MED ORDER — LEVOFLOXACIN IN D5W 750 MG/150ML IV SOLN
750.0000 mg | INTRAVENOUS | Status: DC
  Administered 2019-01-29: 750 mg via INTRAVENOUS
  Filled 2019-01-29: qty 150

## 2019-01-29 MED ORDER — ALBUTEROL SULFATE (2.5 MG/3ML) 0.083% IN NEBU: 2.5000 mg | INHALATION_SOLUTION | Freq: Four times a day (QID) | RESPIRATORY_TRACT | Status: DC | PRN

## 2019-01-29 NOTE — Progress Notes (Signed)
[place] for Lovenox Indication: atrial fibrillation  No Known Allergies  Patient Measurements: Height: 5' (152.4 cm) Weight: 160 lb (72.6 kg) IBW/kg (Calculated) : 45.5  Vital Signs: Temp: 98.4 F (36.9 C) (06/01 0727) Temp Source: Oral (06/01 0727) BP: 132/79 (06/01 0727) Pulse Rate: 80 (06/01 0727)  Labs: Recent Labs    01/27/19 0636 01/28/19 0706 01/29/19 0707  HGB 10.6* 9.9* 11.4*  HCT 30.9* 29.6* 33.6*  PLT PLATELET CLUMPS NOTED ON SMEAR, UNABLE TO ESTIMATE 228 267  CREATININE 1.57* 1.37* 1.33*  CKTOTAL  --  93  --    Estimated Creatinine Clearance: 28.5 mL/min (A) (by C-G formula based on SCr of 1.33 mg/dL (H)).  Medical History: Past Medical History:  Diagnosis Date  . Constipation   . Fracture of femur, right, closed ([place]) 2017  . Hypertension   . Overactive bladder   . Symptomatic anemia 03/2016   Medications:  Scheduled:  . albuterol  2 puff Inhalation Q6H  . diltiazem  60 mg Oral Q6H  . enoxaparin (LOVENOX) injection  75 mg Subcutaneous Q24H  . famotidine  20 mg Oral Daily  . guaiFENesin  1,200 mg Oral BID  . oxybutynin  5 mg Oral QHS  . senna-docusate  1 tablet Oral BID   Infusions:  . ceFEPime (MAXIPIME) IV 2 g (01/27/19 1638)  . lactated ringers     Assessment: 75 yoF admit 5/28, met sepsis parameters. Antibiotics started, treating as Covid +, Covid neg x1, rpt Covid send out. New onset Afib,  Begin Lovenox for Afib, planning transition to Apixaban  Goal of Therapy:  Monitor CBC, s/s bleed   Plan:   Continue Lovenox 30m SQ q24, therapeutic dose with clearance < 30 ml/min  Aylana Hirschfeld A. PLevada Dy PharmD, BTuckerPlease utilize Amion for appropriate phone number to reach the unit pharmacist ([email or web address]   01/29/2019,1:55 PM

## 2019-01-29 NOTE — Progress Notes (Signed)
PROGRESS NOTE    Amanda Berry  YTK:[id number] DOB: [birth year] DOA: 01/25/2019 PCP: Shirline Frees, MD   Brief Narrative:  Per HPI: H/o HTN presented with persistent high fever x5 days,  cough, sob, GI symptoms after attending church.   Admitted for sepsis of unclear etiology and acute hypoxic respiratory failure. Found to have new onset of afib. Pneumonia, improving, wean oxygen, ambulate, d/c in 1-2 days Negative SARSCOV2 x2.   She remains in Afib this am, but rate controlled. Will plan to start Eliquis today and Cardizem long-acting by am. Additionally, patient still hypoxemic on ambulation and will check 2D echo. Liver enzymes still uptrending and will reevaluate in am.  Assessment & Plan:   Active Problems:   Other specified sepsis ([place])   Acute respiratory failure with hypoxia (HCC)   AKI (acute kidney injury) ([place])   Suspected Covid-19 Virus Infection   Hyponatremia   PAF (paroxysmal atrial fibrillation) (HCC)   LFT elevation   Lobar pneumonia (HCC)  Bilateral pneumonia, Sepsis presented on presentation with fever, lactic acidosis, tachycardia, tachypnea, Acute hypoxic respiratory failure - respiratory viral panel negative,  repeat COVID testing negative, mrsa screen negative, blood culture negative, urine culture with multiple species, she denies urinary symptoms ---initial cxr no acute findings, CT chest "1. Bilateral pneumonia with complete consolidation of the right lower lobe. 2. Small pleural effusions." -urine strep pneumo antigen negative,  urine legionella antigen in process, sputum sample pending collection -no diarrhea since in the hospital -procalcitonin 6.3-6.9-4.2, -lactic acid 2.4-1.2 -d/c vanc, continue cefepime for now with anticipated Omnicef on DC -- -wean o2 and add IS  AKI on CKDIII with anemia of chronic disease (hgb  Around 11) Urine culture with multiple species  Likely prerenal Improving on ivf Cr 2.1>1,6>1.55>1.57>1.37>1.33 renal dosing  meds  Hyponatremia-improving Sodium 125 on presentation, likely due to dehydration,  Improving on ivf 132 today  Elevated lft: (tbili, alk phos wnl, mild elevated ast/alt ), ck wnl -From sepsis?  -ab [place] "Early cirrhotic change characterized by a nodular contour of the left lobe of the liver is suggested"  -acute hepatitis panel negative -Trend lft  New onset of aflutter/afib Back to sinus rhythm with cardizem drip , now on oral cardizem and anticoagulation, cardiology input appreciated, case manager assistance for apixaban at discharge Per cardiology "Transition diltiazem to long-acting 120 mg daily at time of dc. If no procedures, start apixaban 5 mg bid." Cardiology will arrange follow up. Check Echo for ongoing dyspnea and signs of HF  HTN:  Was on lisinopril at home, lisinopril held on presentation due to [facility] She is currently on cardizem, bp low normal Will monitor  Likely will discontinue lisinopril (due to renal impairment) and discharge on cardizem alone at discharge  DVT prophylaxis: Eliquis Code Status: Full Family Communication: None at bedside Disposition Plan: Anticipate DC in next 24 hours once echo resulted potentially with home O2.   Consultants:   Cardiology  Procedures:   None  Antimicrobials:  Anti-infectives (From admission, onward)   Start     Dose/Rate Route Frequency Ordered Stop   01/27/19 1800  vancomycin (VANCOCIN) 1,250 mg in sodium chloride 0.9 % 250 mL IVPB  Status:  Discontinued     1,250 mg 166.7 mL/hr over 90 Minutes Intravenous Every 48 hours 01/25/19 1725 01/26/19 1153   01/27/19 0900  vancomycin (VANCOCIN) IVPB 750 mg/150 ml premix  Status:  Discontinued     750 mg 150 mL/hr over 60 Minutes Intravenous Every 36 hours 01/26/19 1153  01/26/19 1446   01/26/19 1800  ceFEPIme (MAXIPIME) 2 g in sodium chloride 0.9 % 100 mL IVPB     2 g 200 mL/hr over 30 Minutes Intravenous Every 24 hours 01/25/19 1725     01/25/19 1730  vancomycin  (VANCOCIN) 1,500 mg in sodium chloride 0.9 % 500 mL IVPB     1,500 mg 250 mL/hr over 120 Minutes Intravenous  Once 01/25/19 1725 01/26/19 0015   01/25/19 1645  ceFEPIme (MAXIPIME) 2 g in sodium chloride 0.9 % 100 mL IVPB     2 g 200 mL/hr over 30 Minutes Intravenous  Once 01/25/19 1643 01/25/19 1744   01/25/19 1645  metroNIDAZOLE (FLAGYL) IVPB 500 mg     500 mg 100 mL/hr over 60 Minutes Intravenous  Once 01/25/19 1643 01/25/19 1905   01/25/19 1645  vancomycin (VANCOCIN) IVPB 1000 mg/200 mL premix  Status:  Discontinued     1,000 mg 200 mL/hr over 60 Minutes Intravenous  Once 01/25/19 1643 01/25/19 1725        Subjective: Patient seen and evaluated today with no new acute complaints or concerns. No acute concerns or events noted overnight.  Objective: Vitals:   01/28/19 2015 01/28/19 2300 01/29/19 0727 01/29/19 1440  BP:  102/60 132/79   Pulse:  76 80   Resp:  16 16   Temp:  97.8 F (36.6 C) 98.4 F (36.9 C)   TempSrc:  Oral Oral   SpO2: 95% 96% 94% 94%  Weight:      Height:        Intake/Output Summary (Last 24 hours) at 01/29/2019 1505 Last data filed at 01/29/2019 0856 Gross per 24 hour  Intake 240 ml  Output -  Net 240 ml   Filed Weights   01/25/19 1711  Weight: 72.6 kg    Examination:  General exam: Appears calm and comfortable  Respiratory system: Clear to auscultation. Respiratory effort normal. On 2-3 L [place]. Cardiovascular system: S1 & S2 heard, RRR. No JVD, murmurs, rubs, gallops or clicks. No pedal edema. Gastrointestinal system: Abdomen is nondistended, soft and nontender. No organomegaly or masses felt. Normal bowel sounds heard. Central nervous system: Alert and oriented. No focal neurological deficits. Extremities: Symmetric 5 x 5 power. Skin: No rashes, lesions or ulcers Psychiatry: Judgement and insight appear normal. Mood & affect appropriate.     Data Reviewed: I have personally reviewed following labs and imaging studies  CBC: Recent Labs   Lab 01/25/19 1411 01/25/19 2039 01/27/19 0636 01/28/19 0706 01/29/19 0707  WBC 9.9 8.5 7.1 9.8 11.1*  NEUTROABS 9.2* 7.7 6.0 8.4* 8.9*  HGB 11.5* 11.0* 10.6* 9.9* 11.4*  HCT 35.2* 33.0* 30.9* 29.6* 33.6*  MCV 93.9 92.4 90.4 90.2 90.6  PLT 154 143* PLATELET CLUMPS NOTED ON SMEAR, UNABLE TO ESTIMATE 228 450   Basic Metabolic Panel: Recent Labs  Lab 01/25/19 2039 01/26/19 0745 01/27/19 0636 01/28/19 0706 01/29/19 0707  NA 128* 132* 131* 131* 132*  K 4.0 3.6 3.7 3.5 3.5  CL 99 102 101 102 104  CO2 19* 17* 17* 16* 17*  GLUCOSE 109* 103* 97 93 89  BUN 38* 37* 35* 28* 25*  CREATININE 1.66* 1.55* 1.57* 1.37* 1.33*  CALCIUM 7.8* 7.4* 7.3* 7.5* 7.8*   GFR: Estimated Creatinine Clearance: 28.5 mL/min (A) (by C-G formula based on SCr of 1.33 mg/dL (H)). Liver Function Tests: Recent Labs  Lab 01/25/19 1411 01/27/19 0636 01/28/19 0706 01/29/19 0707  AST 63* 65* 70* 85*  ALT 42 47* 46*  55*  ALKPHOS 83 80 112 148*  BILITOT 1.0 0.6 1.0 0.9  PROT 6.3* 5.2* 5.4* 5.6*  ALBUMIN 2.6* 1.8* 1.7* 1.9*   No results for input(s): LIPASE, AMYLASE in the last 168 hours. No results for input(s): AMMONIA in the last 168 hours. Coagulation Profile: No results for input(s): INR, PROTIME in the last 168 hours. Cardiac Enzymes: Recent Labs  Lab 01/28/19 0706  CKTOTAL 93   BNP (last 3 results) No results for input(s): PROBNP in the last 8760 hours. HbA1C: No results for input(s): HGBA1C in the last 72 hours. CBG: No results for input(s): GLUCAP in the last 168 hours. Lipid Profile: No results for input(s): CHOL, HDL, LDLCALC, TRIG, CHOLHDL, LDLDIRECT in the last 72 hours. Thyroid Function Tests: No results for input(s): TSH, T4TOTAL, FREET4, T3FREE, THYROIDAB in the last 72 hours. Anemia Panel: No results for input(s): VITAMINB12, FOLATE, FERRITIN, TIBC, IRON, RETICCTPCT in the last 72 hours. Sepsis Labs: Recent Labs  Lab 01/25/19 1411 01/25/19 1626 01/26/19 0745 01/27/19 0636  01/28/19 0706  PROCALCITON  --   --  6.31 6.97 4.22  LATICACIDVEN 2.1* 2.4*  --  1.2  --     Recent Results (from the past 240 hour(s))  Blood culture (routine x 2)     Status: None (Preliminary result)   Collection Time: 01/25/19  2:11 PM  Result Value Ref Range Status   Specimen Description BLOOD RIGHT ANTECUBITAL  Final   Special Requests   Final    BOTTLES DRAWN AEROBIC AND ANAEROBIC Blood Culture adequate volume   Culture   Final    NO GROWTH 4 DAYS Performed at [facility], [place] [address], [place], [place] [address]    Report Status PENDING  Incomplete  Blood culture (routine x 2)     Status: None (Preliminary result)   Collection Time: 01/25/19  2:16 PM  Result Value Ref Range Status   Specimen Description BLOOD LEFT ANTECUBITAL  Final   Special Requests   Final    AEROBIC BOTTLE ONLY Blood Culture results may not be optimal due to an inadequate volume of blood received in culture bottles   Culture   Final    NO GROWTH 4 DAYS Performed at [facility], [place] [address], [place], [place] [address]    Report Status PENDING  Incomplete  SARS Coronavirus 2 (CEPHEID- Performed in [place] hospital lab), Hosp Order     Status: None   Collection Time: 01/25/19  2:57 PM  Result Value Ref Range Status   SARS Coronavirus 2 NEGATIVE NEGATIVE Final    Comment: (NOTE) If result is NEGATIVE SARS-CoV-2 target nucleic acids are NOT DETECTED. The SARS-CoV-2 RNA is generally detectable in upper and lower  respiratory specimens during the acute phase of infection. The lowest  concentration of SARS-CoV-2 viral copies this assay can detect is 250  copies / mL. A negative result does not preclude SARS-CoV-2 infection  and should not be used as the sole basis for treatment or other  patient management decisions.  A negative result may occur with  improper specimen collection / handling, submission of specimen other  than nasopharyngeal swab, presence of viral mutation(s)  within the  areas targeted by this assay, and inadequate number of viral copies  (<250 copies / mL). A negative result must be combined with clinical  observations, patient history, and epidemiological information. If result is POSITIVE SARS-CoV-2 target nucleic acids are DETECTED. The SARS-CoV-2 RNA is generally detectable in upper  and lower  respiratory specimens dur ing the acute phase of infection.  Positive  results are indicative of active infection with SARS-CoV-2.  Clinical  correlation with patient history and other diagnostic information is  necessary to determine patient infection status.  Positive results do  not rule out bacterial infection or co-infection with other viruses. If result is PRESUMPTIVE POSTIVE SARS-CoV-2 nucleic acids MAY BE PRESENT.   A presumptive positive result was obtained on the submitted specimen  and confirmed on repeat testing.  While 2019 novel coronavirus  (SARS-CoV-2) nucleic acids may be present in the submitted sample  additional confirmatory testing may be necessary for epidemiological  and / or clinical management purposes  to differentiate between  SARS-CoV-2 and other Sarbecovirus currently known to infect humans.  If clinically indicated additional testing with an alternate test  methodology [phone]) is advised. The SARS-CoV-2 RNA is generally  detectable in upper and lower respiratory sp ecimens during the acute  phase of infection. The expected result is Negative. Fact Sheet for Patients:  StrictlyIdeas.no Fact Sheet for Healthcare Providers: BankingDealers.co.za This test is not yet approved or cleared by the [place] FDA and has been authorized for detection and/or diagnosis of SARS-CoV-2 by FDA under an Emergency Use Authorization (EUA).  This EUA will remain in effect (meaning this test can be used) for the duration of the COVID-19 declaration under Section 564(b)(1) of the Act,  21 U.S.C. section 360bbb-3(b)(1), unless the authorization is terminated or revoked sooner. Performed at [facility], [place] [address] [place], [place] [address]   Urine culture     Status: Abnormal   Collection Time: 01/25/19  5:30 PM  Result Value Ref Range Status   Specimen Description URINE, RANDOM  Final   Special Requests   Final    NONE Performed at [facility], [place] [address], [place], [place] [address]    Culture MULTIPLE SPECIES PRESENT, SUGGEST RECOLLECTION (A)  Final   Report Status 01/26/2019 FINAL  Final  Novel Coronavirus, NAA (hospital order; send-out to ref lab)     Status: None   Collection Time: 01/25/19  6:49 PM  Result Value Ref Range Status   SARS-CoV-2, NAA NOT DETECTED NOT DETECTED Final    Comment: (NOTE) This test was developed and its performance characteristics determined by [employer]. This test has not been FDA cleared or approved. This test has been authorized by FDA under an Emergency Use Authorization (EUA). This test is only authorized for the duration of time the declaration that circumstances exist justifying the authorization of the emergency use of in vitro diagnostic tests for detection of SARS-CoV-2 virus and/or diagnosis of COVID-19 infection under section 564(b)(1) of the Act, 21 U.S.C. 784ONG-2(X)(5), unless the authorization is terminated or revoked sooner. When diagnostic testing is negative, the possibility of a false negative result should be considered in the context of a patient's recent exposures and the presence of clinical signs and symptoms consistent with COVID-19. An individual without symptoms of COVID-19 and who is not shedding SARS-CoV-2 virus would expect to have a negative (not detected) result in this assay. Performed  At: [facility] [address] [place], [place] [id number] Rush Farmer MD NU:[id number]    [place]  Final    Comment: Performed at [facility], [place] [address], [place], [place] [address]  MRSA PCR Screening     Status: None   Collection Time: 01/25/19  9:27 PM  Result Value Ref Range  Status   MRSA by PCR NEGATIVE NEGATIVE Final    Comment:        The GeneXpert MRSA Assay (FDA approved for NASAL specimens only), is one component of a comprehensive MRSA colonization surveillance program. It is not intended to diagnose MRSA infection nor to guide or monitor treatment for MRSA infections. Performed at [facility], [place] [address] [place], [place] [address]   Respiratory Panel by PCR     Status: None   Collection Time: 01/26/19  7:29 AM  Result Value Ref Range Status   Adenovirus NOT DETECTED NOT DETECTED Final   Coronavirus 229E NOT DETECTED NOT DETECTED Final    Comment: (NOTE) The Coronavirus on the Respiratory Panel, DOES NOT test for the novel  Coronavirus (2019 nCoV)    Coronavirus HKU1 NOT DETECTED NOT DETECTED Final   Coronavirus NL63 NOT DETECTED NOT DETECTED Final   Coronavirus OC43 NOT DETECTED NOT DETECTED Final   Metapneumovirus NOT DETECTED NOT DETECTED Final   Rhinovirus / Enterovirus NOT DETECTED NOT DETECTED Final   Influenza A NOT DETECTED NOT DETECTED Final   Influenza B NOT DETECTED NOT DETECTED Final   Parainfluenza Virus 1 NOT DETECTED NOT DETECTED Final   Parainfluenza Virus 2 NOT DETECTED NOT DETECTED Final   Parainfluenza Virus 3 NOT DETECTED NOT DETECTED Final   Parainfluenza Virus 4 NOT DETECTED NOT DETECTED Final   Respiratory Syncytial Virus NOT DETECTED NOT DETECTED Final   Bordetella pertussis NOT DETECTED NOT DETECTED Final   Chlamydophila pneumoniae NOT DETECTED NOT DETECTED Final   Mycoplasma pneumoniae NOT DETECTED NOT DETECTED Final    Comment: Performed at [facility], [place]. [address] [place], [place] [address]  SARS Coronavirus 2 (CEPHEID- Performed in [place] hospital lab), Hosp Order     Status: None   Collection Time: 01/27/19  1:24 PM  Result  Value Ref Range Status   SARS Coronavirus 2 NEGATIVE NEGATIVE Final    Comment: (NOTE) If result is NEGATIVE SARS-CoV-2 target nucleic acids are NOT DETECTED. The SARS-CoV-2 RNA is generally detectable in upper and lower  respiratory specimens during the acute phase of infection. The lowest  concentration of SARS-CoV-2 viral copies this assay can detect is 250  copies / mL. A negative result does not preclude SARS-CoV-2 infection  and should not be used as the sole basis for treatment or other  patient management decisions.  A negative result may occur with  improper specimen collection / handling, submission of specimen other  than nasopharyngeal swab, presence of viral mutation(s) within the  areas targeted by this assay, and inadequate number of viral copies  (<250 copies / mL). A negative result must be combined with clinical  observations, patient history, and epidemiological information. If result is POSITIVE SARS-CoV-2 target nucleic acids are DETECTED. The SARS-CoV-2 RNA is generally detectable in upper and lower  respiratory specimens dur ing the acute phase of infection.  Positive  results are indicative of active infection with SARS-CoV-2.  Clinical  correlation with patient history and other diagnostic information is  necessary to determine patient infection status.  Positive results do  not rule out bacterial infection or co-infection with other viruses. If result is PRESUMPTIVE POSTIVE SARS-CoV-2 nucleic acids MAY BE PRESENT.   A presumptive positive result was obtained on the submitted specimen  and confirmed on repeat testing.  While 2019 novel coronavirus  (SARS-CoV-2) nucleic acids may be present in the submitted sample  additional confirmatory testing may be necessary for  epidemiological  and / or clinical management purposes  to differentiate between  SARS-CoV-2 and other Sarbecovirus currently known to infect humans.  If clinically indicated additional testing  with an alternate test  methodology [phone]) is advised. The SARS-CoV-2 RNA is generally  detectable in upper and lower respiratory sp ecimens during the acute  phase of infection. The expected result is Negative. Fact Sheet for Patients:  StrictlyIdeas.no Fact Sheet for Healthcare Providers: BankingDealers.co.za This test is not yet approved or cleared by the [place] FDA and has been authorized for detection and/or diagnosis of SARS-CoV-2 by FDA under an Emergency Use Authorization (EUA).  This EUA will remain in effect (meaning this test can be used) for the duration of the COVID-19 declaration under Section 564(b)(1) of the Act, 21 U.S.C. section 360bbb-3(b)(1), unless the authorization is terminated or revoked sooner. Performed at [facility], [place] [address], [place], [place] [address]          Radiology Studies: Ct Chest Wo Contrast  Result Date: 01/27/2019 CLINICAL DATA:  Pneumonia, unresolved for complicated EXAM: CT CHEST WITHOUT CONTRAST TECHNIQUE: Multidetector CT imaging of the chest was performed following the standard protocol without IV contrast. COMPARISON:  Chest x-ray from earlier today FINDINGS: Cardiovascular: Normal heart size. No pericardial effusion. There is extensive aortic and coronary atherosclerosis. Mediastinum/Nodes: Negative for adenopathy or mass. Lungs/Pleura: Dense consolidation without volume loss in the right lower lobe. Airspace opacity with volume loss in the left lower lobe. Small pleural effusions on both sides. Upper Abdomen: Negative Musculoskeletal: Generalized spinal degeneration with focally advanced disc degeneration at T12-L1. IMPRESSION: 1. Bilateral pneumonia with complete consolidation of the right lower lobe. 2. Small pleural effusions. Electronically Signed   By: Monte Fantasia M.D.   On: 01/27/2019 17:45   Dg Chest Port 1 View  Result Date: 01/27/2019 CLINICAL DATA:  Cough,  hypertension EXAM: PORTABLE CHEST 1 VIEW COMPARISON:  01/25/2019 FINDINGS: Normal heart size and vascularity. Left lung remains clear. Increased right basilar opacity, suspect right lower lobe airspace process and developing posterior layering effusion. Difficult to exclude right basilar pneumonia. Negative for edema or pneumothorax. Trachea midline. Aorta atherosclerotic. Degenerative changes of the spine. Bones are osteopenic. IMPRESSION: Right basilar opacity, suspect right lower lobe airspace process and developing right posterior layering effusion. Difficult to exclude pneumonia. Aortic atherosclerosis Electronically Signed   By: Jerilynn Mages.  Shick M.D.   On: 01/27/2019 16:49   US Abdomen Limited Ruq  Result Date: 01/28/2019 CLINICAL DATA:  Elevated liver function tests EXAM: ULTRASOUND ABDOMEN LIMITED RIGHT UPPER QUADRANT COMPARISON:  None. FINDINGS: Gallbladder: No gallstones or wall thickening visualized. No sonographic Murphy sign noted by sonographer. A small amount of pericholecystic fluid is nonspecific. Common bile duct: Diameter: 4 mm Liver: Contour of the left lobe is somewhat nodular suggesting early cirrhotic change. No focal mass. Cortical echogenicity is within normal limits. Portal vein is patent on color Doppler imaging with normal direction of blood flow towards the liver. Additional findings: Right pleural effusion is noted IMPRESSION: Nonspecific pericholecystic fluid. This can be seen with volume overload states. Early cirrhotic change characterized by a nodular contour of the left lobe of the liver is suggested. Right pleural effusion. Electronically Signed   By: Marybelle Killings M.D.   On: 01/28/2019 07:59        Scheduled Meds: . albuterol  2 puff Inhalation Q6H  . diltiazem  60 mg Oral Q6H  . enoxaparin (LOVENOX) injection  75 mg Subcutaneous Q24H  . famotidine  20  mg Oral Daily  . guaiFENesin  1,200 mg Oral BID  . oxybutynin  5 mg Oral QHS  . senna-docusate  1 tablet Oral BID    Continuous Infusions: . ceFEPime (MAXIPIME) IV 2 g (01/27/19 1638)     LOS: 4 days    Time spent: 30 minutes    Uri Covey Darleen Crocker, DO [facility] Pager [phone]  If 7PM-7AM, please contact night-coverage [email or web address] Password [email or web address] 01/29/2019, 3:05 PM

## 2019-01-29 NOTE — TOC Initial Note (Addendum)
Transition of Care [facility]) - Initial/Assessment Note    Patient Details  Name: Amanda Berry MRN: [id number] Date of Birth: 24-Oct-1933  Transition of Care [facility]) CM/SW Contact:    Zenon Mayo, RN Phone Number: 01/29/2019, 3:04 PM  Clinical Narrative:                 From home with spouse, she has walker and BSC at home.  She has PCP , she has no barriers in getting medications and she has transportation at discharge.  Per pt eval rec HHPT, NCM offered choice, she chose [facility], she has had them in the past. Referral made to [facility].  Soc will begin 24-48 hrs post discharge. She will also need home oxygen, referral made to [facility] with [employer], did not see a chronic resp dx, MD is getting a echo also.  Will need to check with Zack with [employer] to make sure she qualifies for home oxygen. NCM gave patient the 30 day savings coupon for eliquis also and informed her of her copay of 47.00 for 30 day supply. Per pharmacy eliquis is the drug of choice for afib.   Expected Discharge Plan: [place] Barriers to Discharge: No Barriers Identified   Patient Goals and CMS Choice Patient states their goals for this hospitalization and ongoing recovery are:: go home and do what she normally would do CMS Medicare.gov Compare Post Acute Care list provided to:: Patient Choice offered to / list presented to : Patient  Expected Discharge Plan and Services Expected Discharge Plan: [place] In-house Referral: NA Discharge Planning Services: CM Consult, Medication Assistance Post Acute Care Choice: [place] arrangements for the past 2 months: Single Family Home                 DME Arranged: Oxygen DME Agency: [employer] Date DME Agency Contacted: 01/29/19 Time DME Agency Contacted: [id number] Representative spoke with at DME Agency: zack HH Arranged: PT [place]: [facility] [facility] [employer] Date [place]: 01/29/19 Time [place]: 19 Representative spoke with at [place]: [place]  Prior Living Arrangements/Services Living arrangements for the past 2 months: [place] Lives with:: Spouse Patient language and need for interpreter reviewed:: Yes Do you feel safe going back to the place where you live?: Yes      Need for Family Participation in Patient Care: No (Comment) Care giver support system in place?: No (comment)   Criminal Activity/Legal Involvement Pertinent to Current Situation/Hospitalization: No - Comment as needed  Activities of Daily Living Home Assistive Devices/Equipment: None ADL Screening (condition at time of admission) Patient's cognitive ability adequate to safely complete daily activities?: Yes Is the patient deaf or have difficulty hearing?: No Does the patient have difficulty seeing, even when wearing glasses/contacts?: No Does the patient have difficulty concentrating, remembering, or making decisions?: No Patient able to express need for assistance with ADLs?: Yes Does the patient have difficulty dressing or bathing?: No Independently performs ADLs?: Yes (appropriate for developmental age) Does the patient have difficulty walking or climbing stairs?: No Weakness of Legs: Both Weakness of Arms/Hands: None  Permission Sought/Granted                  Emotional Assessment Appearance:: Appears stated age Attitude/Demeanor/Rapport: (Appropriate) Affect (typically observed): Accepting Orientation: : Oriented to Self, Oriented to Place, Oriented to  Time, Oriented to Situation Alcohol / Substance Use: Not Applicable Psych Involvement: No (comment)  Admission diagnosis:  sent by dr, Laurine Blazer fever Patient Active Problem List   Diagnosis Date Noted  . LFT elevation   . Lobar pneumonia ([place])   . Acute respiratory failure with hypoxia ([place])   . AKI (acute kidney injury) ([place])   . Suspected Covid-19 Virus Infection   . Hyponatremia   . PAF (paroxysmal atrial  fibrillation) ([place])   . Other specified sepsis ([place]) 01/25/2019  . Symptomatic anemia 04/19/2016  . Hypertension 04/19/2016  . Constipation 04/19/2016  . Overactive bladder 04/19/2016  . Femur fracture, right, closed, initial encounter 04/11/2016   PCP:  Shirline Frees, MD Pharmacy:   [facility], [address] [place] [address] Phone: [phone] Fax: [phone]     Social Determinants of Health (SDOH) Interventions    Readmission Risk Interventions No flowsheet data found.

## 2019-01-29 NOTE — Progress Notes (Signed)
Physical Therapy Note  (full PT evaluation note to follow)  SATURATION QUALIFICATIONS: (This note is used to comply with regulatory documentation for home oxygen)  Patient Saturations on Room Air at Rest = 93%  Patient Saturations on Room Air while Ambulating = 84%  Patient Saturations on 2-3 Liters of oxygen while Ambulating = 96%  Please briefly explain why patient needs home oxygen: Patient requires supplemental oxygen to maintain oxygen saturations at acceptable, safe levels with physical activity.  Roney Marion, [place]  Acute Rehabilitation Services Pager [phone] Office [phone]

## 2019-01-29 NOTE — Progress Notes (Signed)
  Echocardiogram 2D Echocardiogram has been performed.  Amanda Berry L Androw 01/29/2019, 4:45 PM

## 2019-01-29 NOTE — Progress Notes (Addendum)
Pharmacy Antibiotic Note  Jersee Winiarski Benegas is a 85 y.o. female admitted on 01/25/2019 with sepsis.  Pharmacy has been consulted for cefepime dosing. Temp 98.4. WBC 11.1,  Scr 1.33 (baseline 0.6-0.7).  Plan: Cefepime 2g IV q24h Monitor clinical status, renal function, cultures, and length of therapy   Temp (24hrs), Avg:98.1 F (36.7 C), Min:97.8 F (36.6 C), Max:98.4 F (36.9 C)  Recent Labs  Lab 01/25/19 1411 01/25/19 1626 01/25/19 2039 01/26/19 0745 01/27/19 0636 01/28/19 0706 01/29/19 0707  WBC 9.9  --  8.5  --  7.1 9.8 11.1*  CREATININE 2.02*  --  1.66* 1.55* 1.57* 1.37* 1.33*  LATICACIDVEN 2.1* 2.4*  --   --  1.2  --   --     Estimated Creatinine Clearance: 28.5 mL/min (A) (by C-G formula based on SCr of 1.33 mg/dL (H)).    No Known Allergies  Antimicrobials this admission: Cefepime 5/28 >> Vancomycin 5/28 >>5/29 Flagyl 5/28 x1   Microbiology results: 5/28 BCx: ngtd 5/28 Covid in house: neg 5/28 Covid sent out: sent  5/28 UCx: sent 5/28 MRSA PCR: neg  Mina Carlisi A. Levada Dy, PharmD, [place] Please utilize Amion for appropriate phone number to reach the unit pharmacist ([place])   Addendum: Patient found to have legionella PNA. Will d/c cefepime and start levofloxacin 750 mg IV q48h.   Haiden Rawlinson A. Levada Dy, PharmD, [place] Please utilize Amion for appropriate phone number to reach the unit pharmacist ([place])    01/29/2019 1:50 PM

## 2019-01-29 NOTE — Telephone Encounter (Signed)
F/U Message             Hey Mrs. Sunday Spillers this patient's daughter just call to let you know her mom is in the hospital and if you need any additional call her Please [phone]

## 2019-01-29 NOTE — TOC Benefit Eligibility Note (Signed)
Transition of Care [facility] Benefit Eligibility Note    Patient Details  Name: Amanda Berry MRN: [id number] Date of Birth: [birth year]   Medication/Dose: [name]  20 MG  DAILY  Covered?: Yes     Prescription Coverage Preferred Pharmacy: [facility])  Spoke with Person/Company/Phone Number:: [employer] RX # [phone] OPT- 2)  Co-Pay: $ 45.00  Prior Approval: No     Additional Notes: ELIQUIS  5 MG BID(COVER- YES, CO-PAY- $ 47.00, TIER- NO, P/A-NOMemory Argue Phone Number: 01/29/2019, 11:14 AM

## 2019-01-29 NOTE — Evaluation (Signed)
Physical Therapy Evaluation Patient Details Name: Amanda Berry MRN: [id number] DOB: 10-31-1933 Today's Date: 01/29/2019   History of Present Illness  Amanda Berry is a 85 y.o. female with medical history significant for hypertension, overactive bladder who presented to [facility] ED with complaints of persistent high fevers up to 103 with onset on Sunday, 5/23; Acute hypoxic respiratory failure, likely due to viral pneumonia/bronchitis, Covid testing negative,  Clinical Impression   Pt admitted with above diagnosis. Pt currently with functional limitations due to the deficits listed below (see PT Problem List). Managing independently prior to admission; Presents with decr activity tolerance , decr functional capacity;  Pt will benefit from skilled PT to increase their independence and safety with mobility to allow discharge to the venue listed below.       Follow Up Recommendations Home health PT    Equipment Recommendations  Rolling walker with 5" wheels(Oxygen)    Recommendations for Other Services       Precautions / Restrictions Precautions Precautions: Fall Precaution Comments: Fall risk present, but slight, and greatly reduced with use of RW      Mobility  Bed Mobility                  Transfers Overall transfer level: Needs assistance Equipment used: None Transfers: Sit to/from Stand Sit to Stand: Min guard         General transfer comment: [place] for safety; did not need physical assist to rise; noted tended to reach out for UE support to stabilize  Ambulation/Gait Ambulation/Gait assistance: Min guard Gait Distance (Feet): 150 Feet Assistive device: Rolling walker (2 wheeled) Gait Pattern/deviations: Step-through pattern;Decreased step length - right;Decreased step length - left;Decreased stride length Gait velocity: slowed   General Gait Details: Cues to self-monitor for activity tolerance  Stairs            Wheelchair Mobility    Modified  [place] (Stroke Patients Only)       Balance Overall balance assessment: Mild deficits observed, not formally tested                                           Pertinent Vitals/Pain Pain Assessment: No/denies pain    Home Living Family/patient expects to be discharged to:: Private residence Living Arrangements: Spouse/significant other Available Help at Discharge: Family Type of Home: House Home Access: Stairs to enter   Technical brewer of Steps: 1 Home Layout: Two level;Laundry or work area in basement        Prior Function Level of Independence: Independent               Journalist, newspaper        Extremity/Trunk Assessment   Upper Extremity Assessment Upper Extremity Assessment: Generalized weakness    Lower Extremity Assessment Lower Extremity Assessment: Generalized weakness       Communication      Cognition Arousal/Alertness: Awake/alert Behavior During Therapy: WFL for tasks assessed/performed Overall Cognitive Status: Within Functional Limits for tasks assessed                                        General Comments General comments (skin integrity, edema, etc.): See other PT note of this date    Exercises     Assessment/Plan    PT  Assessment Patient needs continued PT services  PT Problem List Decreased strength;Decreased activity tolerance;Decreased balance;Decreased mobility;Decreased knowledge of use of DME;Cardiopulmonary status limiting activity       PT Treatment Interventions DME instruction;Gait training;Stair training;Functional mobility training;Therapeutic activities;Therapeutic exercise;Patient/family education    PT Goals (Current goals can be found in the Care Plan section)  Acute Rehab PT Goals Patient Stated Goal: opes to be home soon PT Goal Formulation: With patient Time For Goal Achievement: 02/12/19 Potential to Achieve Goals: Good    Frequency Min 3X/week   Barriers to  discharge        Co-evaluation               AM-PAC PT "6 Clicks" Mobility  Outcome Measure Help needed turning from your back to your side while in a flat bed without using bedrails?: None Help needed moving from lying on your back to sitting on the side of a flat bed without using bedrails?: None Help needed moving to and from a bed to a chair (including a wheelchair)?: A Little Help needed standing up from a chair using your arms (e.g., wheelchair or bedside chair)?: A Little Help needed to walk in hospital room?: A Little Help needed climbing 3-5 steps with a railing? : A Little 6 Click Score: 20    End of Session Equipment Utilized During Treatment: Gait belt;Oxygen Activity Tolerance: Patient tolerated treatment well Patient left: in bed;with call bell/phone within reach(Sitting EOB) Nurse Communication: Mobility status PT Visit Diagnosis: Other abnormalities of gait and mobility (R26.89)    Time: 1410-1434 PT Time Calculation (min) (ACUTE ONLY): 24 min   Charges:   PT Evaluation $PT Eval Moderate Complexity: 1 Mod PT Treatments $Gait Training: 8-22 mins        Roney Marion, PT  Acute Rehabilitation Services Pager [phone] Office [place] 01/29/2019, 4:15 PM

## 2019-01-30 LAB — COMPREHENSIVE METABOLIC PANEL
ALT: 59 U/L — ABNORMAL HIGH (ref 0–44)
AST: 83 U/L — ABNORMAL HIGH (ref 15–41)
Albumin: 1.8 g/dL — ABNORMAL LOW (ref 3.5–5.0)
Alkaline Phosphatase: 172 U/L — ABNORMAL HIGH (ref 38–126)
Anion gap: 11 (ref 5–15)
BUN: 23 mg/dL (ref 8–23)
CO2: 22 mmol/L (ref 22–32)
Calcium: 8.1 mg/dL — ABNORMAL LOW (ref 8.9–10.3)
Chloride: 102 mmol/L (ref 98–111)
Creatinine, Ser: 1.38 mg/dL — ABNORMAL HIGH (ref 0.44–1.00)
GFR calc Af Amer: 41 mL/min — ABNORMAL LOW (ref >60)
GFR calc non Af Amer: 35 mL/min — ABNORMAL LOW (ref >60)
Glucose, Bld: 99 mg/dL (ref 70–99)
Potassium: 3.6 mmol/L (ref 3.5–5.1)
Sodium: 135 mmol/L (ref 135–145)
Total Bilirubin: 0.9 mg/dL (ref 0.3–1.2)
Total Protein: 5.2 g/dL — ABNORMAL LOW (ref 6.5–8.1)

## 2019-01-30 LAB — CBC
HCT: 29.9 % — ABNORMAL LOW (ref 36.0–46.0)
Hemoglobin: 10.2 g/dL — ABNORMAL LOW (ref 12.0–15.0)
MCH: 30.7 pg (ref 26.0–34.0)
MCHC: 34.1 g/dL (ref 30.0–36.0)
MCV: 90.1 fL (ref 80.0–100.0)
Platelets: 314 10*3/uL (ref 150–400)
RBC: 3.32 MIL/uL — ABNORMAL LOW (ref 3.87–5.11)
RDW: 15.4 % (ref 11.5–15.5)
WBC: 12.4 10*3/uL — ABNORMAL HIGH (ref 4.0–10.5)
nRBC: 0 % (ref 0.0–0.2)

## 2019-01-30 LAB — CULTURE, BLOOD (ROUTINE X 2)
Culture: NO GROWTH
Culture: NO GROWTH
Special Requests: ADEQUATE

## 2019-01-30 LAB — LEGIONELLA PNEUMOPHILA SEROGP 1 UR AG: L. pneumophila Serogp 1 Ur Ag: POSITIVE — AB

## 2019-01-30 MED ORDER — AMOXICILLIN-POT CLAVULANATE 875-125 MG PO TABS: 1.0000 | ORAL_TABLET | Freq: Two times a day (BID) | ORAL | Status: DC

## 2019-01-30 MED ORDER — AMOXICILLIN-POT CLAVULANATE 500-125 MG PO TABS
1.0000 | ORAL_TABLET | Freq: Two times a day (BID) | ORAL | Status: DC
  Administered 2019-01-30: 500 mg via ORAL
  Filled 2019-01-30: qty 1

## 2019-01-30 MED ORDER — FUROSEMIDE 10 MG/ML IJ SOLN
20.0000 mg | Freq: Once | INTRAMUSCULAR | Status: AC
  Administered 2019-01-30: 20 mg via INTRAVENOUS
  Filled 2019-01-30: qty 2

## 2019-01-30 MED ORDER — LEVOFLOXACIN 750 MG PO TABS: 750.0000 mg | ORAL_TABLET | ORAL | Status: DC

## 2019-01-30 MED ORDER — POTASSIUM CHLORIDE CRYS ER 20 MEQ PO TBCR
40.0000 meq | EXTENDED_RELEASE_TABLET | Freq: Once | ORAL | Status: AC
  Administered 2019-01-30: 40 meq via ORAL
  Filled 2019-01-30: qty 2

## 2019-01-30 NOTE — Progress Notes (Signed)
PROGRESS NOTE    Amanda Berry  CXK:[id number] DOB: 18-Oct-1933 DOA: 01/25/2019 PCP: Shirline Frees, MD   Brief Narrative:  85 year old female with history of hypertension, overactive bladder presented to [facility] ED on 5/28 with high fevers up to 103, productive cough sore throat headaches and shortness of breath, symptoms ongoing for about 5 days. -She tested negative for COVID x2 -Admitted with sepsis from bilateral multifocal pneumonia she was positive for urine Legionella antigen, also found to have new onset rapid A. fib this admission, followed by cardiology, started on Cardizem and Eliquis for this. -Transferred to my care today  Assessment & Plan:   Sepsis, bilateral multifocal pneumonia  -Clinically improving -Had lactic acidosis and procalcitonin of 6.3 on admission, which has trended down-highly suggestive of bacterial pneumonia from Legionella -Urine Legionella antigen was positive -Covid PCR was negative x2 -Initial x-ray did not show acute findings however CT chest noted bilateral pneumonia with complete consolidation of right lower lobe and small pleural effusions -She was treated with broad-spectrum antibiotics initially -Blood cultures are negative -Antibiotics transition to levofloxacin to cover for Legionella pneumonia -will switch to p.o. antibiotics today -Wean O2, increase activity, ambulation -Discharge planning, possible home with home health services tomorrow -Needs follow-up x-ray in 6 weeks  Acute hypoxic respiratory failure -Secondary to extensive multifocal pneumonia from Legionella -Clinically improving also slightly fluid overloaded likely iatrogenic from fluid resuscitation on admission -Give IV Lasix x1 day, 2D echo noted preserved EF and impaired relaxation consistent with diastolic dysfunction -Wean off O2 today  AKI on CKDIII  -improving, creatinine down from 2.1-1.3 with hydration -Lisinopril discontinued  Hyponatremia-improving -improved with  hydration  Elevated lft: (tbili, alk phos wnl, mild elevated ast/alt ), ck wnl -Likely due to sepsis, Legionella -ab [place] "Early cirrhotic change characterized by a nodular contour of the left lobe of the liver is suggested"  -acute hepatitis panel negative -Continue to trend LFTs, no acute abdominal symptoms  New onset of aflutter/afib -Likely triggered by extensive pneumonia -Briefly required Cardizem drip, now on oral Cardizem, converted to sinus rhythm -Was also seen by cardiology earlier this admission, now signed off, she was started on apixaban for anticoagulation -2D echo noted preserved EF  HTN:  Was on lisinopril at home, lisinopril held on presentation due to aki -BP stable on diltiazem  DVT prophylaxis: Eliquis Code Status: Full Family Communication: Discussed extensively with patient at bedside Disposition Plan:  home tomorrow if stable  Consultants:   Cardiology  Procedures:   None  Antimicrobials:  Anti-infectives (From admission, onward)   Start     Dose/Rate Route Frequency Ordered Stop   01/30/19 1115  amoxicillin-clavulanate (AUGMENTIN) 500-125 MG per tablet 500 mg     1 tablet Oral 2 times daily 01/30/19 1038     01/30/19 1030  amoxicillin-clavulanate (AUGMENTIN) 875-125 MG per tablet 1 tablet  Status:  Discontinued     1 tablet Oral Every 12 hours 01/30/19 1023 01/30/19 1038   01/29/19 1715  levofloxacin (LEVAQUIN) IVPB 750 mg  Status:  Discontinued     750 mg 100 mL/hr over 90 Minutes Intravenous Every 48 hours 01/29/19 1702 01/30/19 1023   01/27/19 1800  vancomycin (VANCOCIN) 1,250 mg in sodium chloride 0.9 % 250 mL IVPB  Status:  Discontinued     1,250 mg 166.7 mL/hr over 90 Minutes Intravenous Every 48 hours 01/25/19 1725 01/26/19 1153   01/27/19 0900  vancomycin (VANCOCIN) IVPB 750 mg/150 ml premix  Status:  Discontinued     750 mg  150 mL/hr over 60 Minutes Intravenous Every 36 hours 01/26/19 1153 01/26/19 1446   01/26/19 1800  ceFEPIme  (MAXIPIME) 2 g in sodium chloride 0.9 % 100 mL IVPB  Status:  Discontinued     2 g 200 mL/hr over 30 Minutes Intravenous Every 24 hours 01/25/19 1725 01/29/19 1656   01/25/19 1730  vancomycin (VANCOCIN) 1,500 mg in sodium chloride 0.9 % 500 mL IVPB     1,500 mg 250 mL/hr over 120 Minutes Intravenous  Once 01/25/19 1725 01/26/19 0015   01/25/19 1645  ceFEPIme (MAXIPIME) 2 g in sodium chloride 0.9 % 100 mL IVPB     2 g 200 mL/hr over 30 Minutes Intravenous  Once 01/25/19 1643 01/25/19 1744   01/25/19 1645  metroNIDAZOLE (FLAGYL) IVPB 500 mg     500 mg 100 mL/hr over 60 Minutes Intravenous  Once 01/25/19 1643 01/25/19 1905   01/25/19 1645  vancomycin (VANCOCIN) IVPB 1000 mg/200 mL premix  Status:  Discontinued     1,000 mg 200 mL/hr over 60 Minutes Intravenous  Once 01/25/19 1643 01/25/19 1725       Subjective: -Feels a little better today, reports that her breathing is finally coming along  Objective: Vitals:   01/29/19 0727 01/29/19 1440 01/29/19 1726 01/30/19 0716  BP: 132/79  133/63 106/73  Pulse: 80  87 82  Resp: 16  16   Temp: 98.4 F (36.9 C)  98.2 F (36.8 C) 98.3 F (36.8 C)  TempSrc: Oral  Oral Oral  SpO2: 94% 94% (!) 89% 95%  Weight:      Height:        Intake/Output Summary (Last 24 hours) at 01/30/2019 1149 Last data filed at 01/29/2019 2300 Gross per 24 hour  Intake 990 ml  Output 650 ml  Net 340 ml   Filed Weights   01/25/19 1711  Weight: 72.6 kg    Examination:  Gen: Awake, Alert, Oriented X 3, no distress HEENT: PERRLA, Neck supple, no JVD Lungs: Good air movement bilaterally, bronchial breath sounds at the bases, CVS: RRR,No Gallops,Rubs or new Murmurs Abd: soft, Non tender, non distended, BS present Extremities: 1+ edema in the ankles Skin: no new rashes Psychiatry: Judgement and insight appear normal. Mood & affect appropriate.     Data Reviewed: I have personally reviewed following labs and imaging studies  CBC: Recent Labs  Lab  01/25/19 1411 01/25/19 2039 01/27/19 0636 01/28/19 0706 01/29/19 0707 01/30/19 0456  WBC 9.9 8.5 7.1 9.8 11.1* 12.4*  NEUTROABS 9.2* 7.7 6.0 8.4* 8.9*  --   HGB 11.5* 11.0* 10.6* 9.9* 11.4* 10.2*  HCT 35.2* 33.0* 30.9* 29.6* 33.6* 29.9*  MCV 93.9 92.4 90.4 90.2 90.6 90.1  PLT 154 143* PLATELET CLUMPS NOTED ON SMEAR, UNABLE TO ESTIMATE 228 267 607   Basic Metabolic Panel: Recent Labs  Lab 01/26/19 0745 01/27/19 0636 01/28/19 0706 01/29/19 0707 01/30/19 0456  NA 132* 131* 131* 132* 135  K 3.6 3.7 3.5 3.5 3.6  CL 102 101 102 104 102  CO2 17* 17* 16* 17* 22  GLUCOSE 103* 97 93 89 99  BUN 37* 35* 28* 25* 23  CREATININE 1.55* 1.57* 1.37* 1.33* 1.38*  CALCIUM 7.4* 7.3* 7.5* 7.8* 8.1*   GFR: Estimated Creatinine Clearance: 27.5 mL/min (A) (by C-G formula based on SCr of 1.38 mg/dL (H)). Liver Function Tests: Recent Labs  Lab 01/25/19 1411 01/27/19 0636 01/28/19 0706 01/29/19 0707 01/30/19 0456  AST 63* 65* 70* 85* 83*  ALT 42 47* 46*  55* 59*  ALKPHOS 83 80 112 148* 172*  BILITOT 1.0 0.6 1.0 0.9 0.9  PROT 6.3* 5.2* 5.4* 5.6* 5.2*  ALBUMIN 2.6* 1.8* 1.7* 1.9* 1.8*   No results for input(s): LIPASE, AMYLASE in the last 168 hours. No results for input(s): AMMONIA in the last 168 hours. Coagulation Profile: No results for input(s): INR, PROTIME in the last 168 hours. Cardiac Enzymes: Recent Labs  Lab 01/28/19 0706  CKTOTAL 93   BNP (last 3 results) No results for input(s): PROBNP in the last 8760 hours. HbA1C: No results for input(s): HGBA1C in the last 72 hours. CBG: No results for input(s): GLUCAP in the last 168 hours. Lipid Profile: No results for input(s): CHOL, HDL, LDLCALC, TRIG, CHOLHDL, LDLDIRECT in the last 72 hours. Thyroid Function Tests: No results for input(s): TSH, T4TOTAL, FREET4, T3FREE, THYROIDAB in the last 72 hours. Anemia Panel: No results for input(s): VITAMINB12, FOLATE, FERRITIN, TIBC, IRON, RETICCTPCT in the last 72 hours. Sepsis Labs:  Recent Labs  Lab 01/25/19 1411 01/25/19 1626 01/26/19 0745 01/27/19 0636 01/28/19 0706  PROCALCITON  --   --  6.31 6.97 4.22  LATICACIDVEN 2.1* 2.4*  --  1.2  --     Recent Results (from the past 240 hour(s))  Blood culture (routine x 2)     Status: None   Collection Time: 01/25/19  2:11 PM  Result Value Ref Range Status   Specimen Description BLOOD RIGHT ANTECUBITAL  Final   Special Requests   Final    BOTTLES DRAWN AEROBIC AND ANAEROBIC Blood Culture adequate volume   Culture   Final    NO GROWTH 5 DAYS Performed at [facility], [address], [place], [place] [address]    Report Status 01/30/2019 FINAL  Final  Blood culture (routine x 2)     Status: None   Collection Time: 01/25/19  2:16 PM  Result Value Ref Range Status   Specimen Description BLOOD LEFT ANTECUBITAL  Final   Special Requests   Final    AEROBIC BOTTLE ONLY Blood Culture results may not be optimal due to an inadequate volume of blood received in culture bottles   Culture   Final    NO GROWTH 5 DAYS Performed at [facility], [place] [address], [place], [place] [address]    Report Status 01/30/2019 FINAL  Final  SARS Coronavirus 2 (CEPHEID- Performed in [place] hospital lab), Hosp Order     Status: None   Collection Time: 01/25/19  2:57 PM  Result Value Ref Range Status   SARS Coronavirus 2 NEGATIVE NEGATIVE Final    Comment: (NOTE) If result is NEGATIVE SARS-CoV-2 target nucleic acids are NOT DETECTED. The SARS-CoV-2 RNA is generally detectable in upper and lower  respiratory specimens during the acute phase of infection. The lowest  concentration of SARS-CoV-2 viral copies this assay can detect is 250  copies / mL. A negative result does not preclude SARS-CoV-2 infection  and should not be used as the sole basis for treatment or other  patient management decisions.  A negative result may occur with  improper specimen collection / handling, submission of specimen other  than  nasopharyngeal swab, presence of viral mutation(s) within the  areas targeted by this assay, and inadequate number of viral copies  (<250 copies / mL). A negative result must be combined with clinical  observations, patient history, and epidemiological information. If result is POSITIVE SARS-CoV-2 target nucleic acids are DETECTED. The SARS-CoV-2 RNA is generally  detectable in upper and lower  respiratory specimens dur ing the acute phase of infection.  Positive  results are indicative of active infection with SARS-CoV-2.  Clinical  correlation with patient history and other diagnostic information is  necessary to determine patient infection status.  Positive results do  not rule out bacterial infection or co-infection with other viruses. If result is PRESUMPTIVE POSTIVE SARS-CoV-2 nucleic acids MAY BE PRESENT.   A presumptive positive result was obtained on the submitted specimen  and confirmed on repeat testing.  While 2019 novel coronavirus  (SARS-CoV-2) nucleic acids may be present in the submitted sample  additional confirmatory testing may be necessary for epidemiological  and / or clinical management purposes  to differentiate between  SARS-CoV-2 and other Sarbecovirus currently known to infect humans.  If clinically indicated additional testing with an alternate test  methodology [phone]) is advised. The SARS-CoV-2 RNA is generally  detectable in upper and lower respiratory sp ecimens during the acute  phase of infection. The expected result is Negative. Fact Sheet for Patients:  StrictlyIdeas.no Fact Sheet for Healthcare Providers: BankingDealers.co.za This test is not yet approved or cleared by the [place] FDA and has been authorized for detection and/or diagnosis of SARS-CoV-2 by FDA under an Emergency Use Authorization (EUA).  This EUA will remain in effect (meaning this test can be used) for the duration of the  COVID-19 declaration under Section 564(b)(1) of the Act, 21 U.S.C. section 360bbb-3(b)(1), unless the authorization is terminated or revoked sooner. Performed at [facility], [place] [address] [place], [place] [address]   Urine culture     Status: Abnormal   Collection Time: 01/25/19  5:30 PM  Result Value Ref Range Status   Specimen Description URINE, RANDOM  Final   Special Requests   Final    NONE Performed at [facility], [place] [address], [place], [place] [address]    Culture MULTIPLE SPECIES PRESENT, SUGGEST RECOLLECTION (A)  Final   Report Status 01/26/2019 FINAL  Final  Novel Coronavirus, NAA (hospital order; send-out to ref lab)     Status: None   Collection Time: 01/25/19  6:49 PM  Result Value Ref Range Status   SARS-CoV-2, NAA NOT DETECTED NOT DETECTED Final    Comment: (NOTE) This test was developed and its performance characteristics determined by [employer]. This test has not been FDA cleared or approved. This test has been authorized by FDA under an Emergency Use Authorization (EUA). This test is only authorized for the duration of time the declaration that circumstances exist justifying the authorization of the emergency use of in vitro diagnostic tests for detection of SARS-CoV-2 virus and/or diagnosis of COVID-19 infection under section 564(b)(1) of the Act, 21 U.S.C. 468EHO-1(Y)(2), unless the authorization is terminated or revoked sooner. When diagnostic testing is negative, the possibility of a false negative result should be considered in the context of a patient's recent exposures and the presence of clinical signs and symptoms consistent with COVID-19. An individual without symptoms of COVID-19 and who is not shedding SARS-CoV-2 virus would expect to have a negative (not detected) result in this assay. Performed  At: [facility] [address] [place], [place] [id number] Rush Farmer MD WU:[id number]    [place]  Final    Comment: Performed at [facility], [place] [address], [place], [place] [address]  MRSA PCR Screening     Status: None   Collection Time: 01/25/19  9:27 PM  Result  Value Ref Range Status   MRSA by PCR NEGATIVE NEGATIVE Final    Comment:        The GeneXpert MRSA Assay (FDA approved for NASAL specimens only), is one component of a comprehensive MRSA colonization surveillance program. It is not intended to diagnose MRSA infection nor to guide or monitor treatment for MRSA infections. Performed at [facility], [place] [address], [place], [place] [address]   Respiratory Panel by PCR     Status: None   Collection Time: 01/26/19  7:29 AM  Result Value Ref Range Status   Adenovirus NOT DETECTED NOT DETECTED Final   Coronavirus 229E NOT DETECTED NOT DETECTED Final    Comment: (NOTE) The Coronavirus on the Respiratory Panel, DOES NOT test for the novel  Coronavirus (2019 nCoV)    Coronavirus HKU1 NOT DETECTED NOT DETECTED Final   Coronavirus NL63 NOT DETECTED NOT DETECTED Final   Coronavirus OC43 NOT DETECTED NOT DETECTED Final   Metapneumovirus NOT DETECTED NOT DETECTED Final   Rhinovirus / Enterovirus NOT DETECTED NOT DETECTED Final   Influenza A NOT DETECTED NOT DETECTED Final   Influenza B NOT DETECTED NOT DETECTED Final   Parainfluenza Virus 1 NOT DETECTED NOT DETECTED Final   Parainfluenza Virus 2 NOT DETECTED NOT DETECTED Final   Parainfluenza Virus 3 NOT DETECTED NOT DETECTED Final   Parainfluenza Virus 4 NOT DETECTED NOT DETECTED Final   Respiratory Syncytial Virus NOT DETECTED NOT DETECTED Final   Bordetella pertussis NOT DETECTED NOT DETECTED Final   Chlamydophila pneumoniae NOT DETECTED NOT DETECTED Final   Mycoplasma pneumoniae NOT DETECTED NOT DETECTED Final    Comment: Performed at [facility], [place]. [address] [place],  [address]  SARS Coronavirus 2 (CEPHEID- Performed in [place] hospital lab), Hosp Order      Status: None   Collection Time: 01/27/19  1:24 PM  Result Value Ref Range Status   SARS Coronavirus 2 NEGATIVE NEGATIVE Final    Comment: (NOTE) If result is NEGATIVE SARS-CoV-2 target nucleic acids are NOT DETECTED. The SARS-CoV-2 RNA is generally detectable in upper and lower  respiratory specimens during the acute phase of infection. The lowest  concentration of SARS-CoV-2 viral copies this assay can detect is 250  copies / mL. A negative result does not preclude SARS-CoV-2 infection  and should not be used as the sole basis for treatment or other  patient management decisions.  A negative result may occur with  improper specimen collection / handling, submission of specimen other  than nasopharyngeal swab, presence of viral mutation(s) within the  areas targeted by this assay, and inadequate number of viral copies  (<250 copies / mL). A negative result must be combined with clinical  observations, patient history, and epidemiological information. If result is POSITIVE SARS-CoV-2 target nucleic acids are DETECTED. The SARS-CoV-2 RNA is generally detectable in upper and lower  respiratory specimens dur ing the acute phase of infection.  Positive  results are indicative of active infection with SARS-CoV-2.  Clinical  correlation with patient history and other diagnostic information is  necessary to determine patient infection status.  Positive results do  not rule out bacterial infection or co-infection with other viruses. If result is PRESUMPTIVE POSTIVE SARS-CoV-2 nucleic acids MAY BE PRESENT.   A presumptive positive result was obtained on the submitted specimen  and confirmed on repeat testing.  While 2019 novel coronavirus  (SARS-CoV-2) nucleic acids may be present in the submitted sample  additional confirmatory testing may  be necessary for epidemiological  and / or clinical management purposes  to differentiate between  SARS-CoV-2 and other Sarbecovirus currently known to  infect humans.  If clinically indicated additional testing with an alternate test  methodology [phone]) is advised. The SARS-CoV-2 RNA is generally  detectable in upper and lower respiratory sp ecimens during the acute  phase of infection. The expected result is Negative. Fact Sheet for Patients:  StrictlyIdeas.no Fact Sheet for Healthcare Providers: BankingDealers.co.za This test is not yet approved or cleared by the [place] FDA and has been authorized for detection and/or diagnosis of SARS-CoV-2 by FDA under an Emergency Use Authorization (EUA).  This EUA will remain in effect (meaning this test can be used) for the duration of the COVID-19 declaration under Section 564(b)(1) of the Act, 21 U.S.C. section 360bbb-3(b)(1), unless the authorization is terminated or revoked sooner. Performed at [facility], [place] [address] [place], [place] [address]          Radiology Studies: No results found.      Scheduled Meds: . amoxicillin-clavulanate  1 tablet Oral BID  . apixaban  5 mg Oral BID  . diltiazem  120 mg Oral Daily  . famotidine  20 mg Oral Daily  . guaiFENesin  1,200 mg Oral BID  . oxybutynin  5 mg Oral QHS  . senna-docusate  1 tablet Oral BID   Continuous Infusions:    LOS: 5 days    Time spent: 30 minutes    Domenic Polite, MD [facility] 01/30/2019, 11:49 AM

## 2019-01-30 NOTE — Telephone Encounter (Signed)
Follow Up:   I was trying to make an appointment from My Staff Messages. Thank you for the update.s

## 2019-01-30 NOTE — Progress Notes (Addendum)
Daily Nursing Note  Received report from Mick Sell, Therapist, sports. Introduced self to patient who was in no distress at the time of assessment. Gotten OOB to chair which she tolerated well. Titrated off of nasal cannula in the afternoon, has been in 90's throughout the day while patient is sitting though she is noted to desat with mobility (refer to mobility note). Plan for patient to discharge home tomorrow if remains stable. All patient needs met throughout the day.

## 2019-01-30 NOTE — Plan of Care (Signed)
  Problem: Pain Managment: Goal: General experience of comfort will improve Outcome: Progressing   Problem: Safety: Goal: Ability to remain free from injury will improve Outcome: Progressing   Problem: Skin Integrity: Goal: Risk for impaired skin integrity will decrease Outcome: Progressing   

## 2019-01-30 NOTE — Progress Notes (Signed)
Pharmacy Antibiotic Note  Amanda Berry is a 85 y.o. female admitted on 01/25/2019 with sepsis secondary to bilateral pneumonia. Legionalla urinary antigen returned positive, so antibiotics changed to Levofloxacin yesterday. To change to PO today per MD. Pharmacy has been consulted for dosing.  Plan: Levofloxacin 750mg  PO q48h Monitor renal function, clinical course, duration of therapy.  Height: 5' (152.4 cm) Weight: 160 lb (72.6 kg) IBW/kg (Calculated) : 45.5  Temp (24hrs), Avg:98.3 F (36.8 C), Min:98.2 F (36.8 C), Max:98.3 F (36.8 C)  Recent Labs  Lab 01/25/19 1411 01/25/19 1626 01/25/19 2039 01/26/19 0745 01/27/19 0636 01/28/19 0706 01/29/19 0707 01/30/19 0456  WBC 9.9  --  8.5  --  7.1 9.8 11.1* 12.4*  CREATININE 2.02*  --  1.66* 1.55* 1.57* 1.37* 1.33* 1.38*  LATICACIDVEN 2.1* 2.4*  --   --  1.2  --   --   --     Estimated Creatinine Clearance: 27.5 mL/min (A) (by C-G formula based on SCr of 1.38 mg/dL (H)).    No Known Allergies  Antimicrobials this admission: Cefepime 5/28 >> 6/1 Vancomycin 5/28 >> 5/29 Flagyl 5/28 x1 Levaquin 6/1 >>  Microbiology results: 5/28 BCx: NGF 5/28 COVID in house: neg 5/28 COVID send out: neg 5/28 UCx: multiple species, suggest recollection  5/28 MRSA PCR: neg 5/29 Resp panel: neg 5/30 COVID in house: neg 5/30 Strep pneumo ur ag: neg 5/30 Legionella ur ag: positive    Thank you for allowing pharmacy to be a part of this patient's care.    Lindell Spar, PharmD, BCPS Pager: [phone] 01/30/2019 12:07 PM

## 2019-01-30 NOTE — Progress Notes (Signed)
SATURATION QUALIFICATIONS: (This note is used to comply with regulatory documentation for home oxygen)  Patient Saturations on Room Air at Rest = 94%  Patient Saturations on Room Air while Ambulating = 82%  Patient Saturations on 2 Liters of oxygen while Ambulating = 92%

## 2019-01-31 DIAGNOSIS — N179 Acute kidney failure, unspecified: Secondary | ICD-10-CM

## 2019-01-31 LAB — COMPREHENSIVE METABOLIC PANEL
ALT: 82 U/L — ABNORMAL HIGH (ref 0–44)
AST: 132 U/L — ABNORMAL HIGH (ref 15–41)
Albumin: 2 g/dL — ABNORMAL LOW (ref 3.5–5.0)
Alkaline Phosphatase: 203 U/L — ABNORMAL HIGH (ref 38–126)
Anion gap: 13 (ref 5–15)
BUN: 22 mg/dL (ref 8–23)
CO2: 19 mmol/L — ABNORMAL LOW (ref 22–32)
Calcium: 8.1 mg/dL — ABNORMAL LOW (ref 8.9–10.3)
Chloride: 103 mmol/L (ref 98–111)
Creatinine, Ser: 1.25 mg/dL — ABNORMAL HIGH (ref 0.44–1.00)
GFR calc Af Amer: 46 mL/min — ABNORMAL LOW (ref >60)
GFR calc non Af Amer: 40 mL/min — ABNORMAL LOW (ref >60)
Glucose, Bld: 88 mg/dL (ref 70–99)
Potassium: 3.6 mmol/L (ref 3.5–5.1)
Sodium: 135 mmol/L (ref 135–145)
Total Bilirubin: 0.7 mg/dL (ref 0.3–1.2)
Total Protein: 5.4 g/dL — ABNORMAL LOW (ref 6.5–8.1)

## 2019-01-31 LAB — CBC
HCT: 30.6 % — ABNORMAL LOW (ref 36.0–46.0)
Hemoglobin: 10.5 g/dL — ABNORMAL LOW (ref 12.0–15.0)
MCH: 30.9 pg (ref 26.0–34.0)
MCHC: 34.3 g/dL (ref 30.0–36.0)
MCV: 90 fL (ref 80.0–100.0)
Platelets: 364 10*3/uL (ref 150–400)
RBC: 3.4 MIL/uL — ABNORMAL LOW (ref 3.87–5.11)
RDW: 15.5 % (ref 11.5–15.5)
WBC: 11.9 10*3/uL — ABNORMAL HIGH (ref 4.0–10.5)
nRBC: 0 % (ref 0.0–0.2)

## 2019-01-31 MED ORDER — APIXABAN 5 MG PO TABS: 5.0000 mg | ORAL_TABLET | Freq: Two times a day (BID) | ORAL | 1 refills | Status: DC

## 2019-01-31 MED ORDER — LEVOFLOXACIN 750 MG PO TABS: ORAL_TABLET | ORAL | 0 refills | Status: DC

## 2019-01-31 MED ORDER — APIXABAN 5 MG PO TABS: 5.0000 mg | ORAL_TABLET | Freq: Two times a day (BID) | ORAL | 1 refills | Status: AC

## 2019-01-31 MED ORDER — DILTIAZEM HCL ER COATED BEADS 120 MG PO CP24: 120.0000 mg | ORAL_CAPSULE | Freq: Every day | ORAL | 1 refills | Status: DC

## 2019-01-31 MED ORDER — FAMOTIDINE 20 MG PO TABS: 20.0000 mg | ORAL_TABLET | Freq: Every day | ORAL | Status: DC

## 2019-01-31 MED FILL — ELIQUIS 5 MG TABLET: 5 | 30 days supply | Qty: 60 | Fill #0

## 2019-01-31 MED FILL — DILTIAZEM 24HR ER 120 MG CA: 120 | 30 days supply | Qty: 30 | Fill #0

## 2019-01-31 MED FILL — levoFLOXacin 750 MG TABS: 750 | 3 days supply | Qty: 3 | Fill #0

## 2019-01-31 NOTE — Progress Notes (Signed)
SATURATION QUALIFICATIONS: (This note is used to comply with regulatory documentation for home oxygen)  Patient Saturations on Room Air at Rest = 97%  Patient Saturations on Room Air while Ambulating = 82%  Patient Saturations on 3 Liters of oxygen while Ambulating = 89-95%  Please briefly explain why patient needs home oxygen: Patient requires supplemental oxygen to maintain oxygen saturations at acceptable, safe levels with physical activity.  Roney Marion, [place]  Acute Rehabilitation Services Pager [phone] Office [phone]  (full PT treatment note to follow)

## 2019-01-31 NOTE — TOC Transition Note (Signed)
Transition of Care [facility]) - CM/SW Discharge Note   Patient Details  Name: Amanda Berry MRN: [id number] Date of Birth: 1934/07/23  Transition of Care [facility] CM/SW Contact:  Zenon Mayo, RN Phone Number: 01/31/2019, 12:29 PM   Clinical Narrative:    From home with spouse, she has walker and BSC at home.  She has PCP , she has no barriers in getting medications and she has transportation at discharge.  Per pt eval rec HHPT, NCM offered choice, she chose [facility], she has had them in the past. Referral made to [facility].  Soc will begin 24-48 hrs post discharge. She will also need home oxygen, referral made to [facility] with [employer], did not see a chronic resp dx, MD is getting a echo also.  Will need to check with Zack with [employer] to make sure she qualifies for home oxygen. NCM gave patient the 30 day savings coupon for eliquis also and informed her of her copay of 47.00 for 30 day supply. Per pharmacy eliquis is the drug of choice for afib.   6/3 Tomi Bamberger RN, BSN- patient is for dc today, Tiffany with [facility] notified, and Zack with [employer] notified, he will bring the home oxygen and the junior rollator up to patients room prior to discharge.    Final next level of care: [place] Barriers to Discharge: No Barriers Identified   Patient Goals and CMS Choice Patient states their goals for this hospitalization and ongoing recovery are:: go home and do what she normally would do CMS Medicare.gov Compare Post Acute Care list provided to:: Patient Choice offered to / list presented to : Patient  Discharge Placement                       Discharge Plan and Services In-house Referral: NA Discharge Planning Services: CM Consult, Medication Assistance Post Acute Care Choice: Home Health          DME Arranged: Walker rolling with seat DME Agency: [employer] Date DME Agency Contacted: 01/29/19 Time DME Agency Contacted: [id number] Representative spoke with at DME Agency: zack HH  Arranged: PT [place]: [facility] [facility] [employer] Date [place]: 01/29/19 Time [place]: 69 Representative spoke with at [place]: [place] [facility] Interventions     Readmission Risk Interventions No flowsheet data found.

## 2019-01-31 NOTE — Progress Notes (Signed)
Physical Therapy Treatment Patient Details Name: Amanda Berry MRN: [id number] DOB: 13-Aug-1934 Today's Date: 01/31/2019    History of Present Illness Amanda Berry is a 85 y.o. female with medical history significant for hypertension, overactive bladder who presented to [facility] ED with complaints of persistent high fevers up to 103 with onset on Sunday, 5/23; Acute hypoxic respiratory failure, likely due to viral pneumonia/bronchitis, Covid testing negative,    PT Comments    Continuing work on functional mobility and activity tolerance;  Interesting progression with [name]; She is able to maintain good oxygenation at rest on Room Air, which is an improvement; Still, today with activity, she desaturated to lower levels than last session and required more standing rest breaks with focused breathing to keep O2 sats at acceptable levels with activity, even with supplemental O2 3 L; continue to recommend HHPT follow up, supplemental O2; Will also amend equipment rec from RW to rollator rW, to allwo for seated rest breaks.  Follow Up Recommendations  Home health PT     Equipment Recommendations  Other (comment)(Rollator RW, small, and supplemental O2)    Recommendations for Other Services       Precautions / Restrictions Precautions Precautions: Fall Precaution Comments: Fall risk present, but slight, and greatly reduced with use of RW Restrictions Weight Bearing Restrictions: No    Mobility  Bed Mobility                  Transfers Overall transfer level: Needs assistance Equipment used: None Transfers: Sit to/from Stand Sit to Stand: Min guard         General transfer comment: [place] for safety; did not need physical assist to rise; noted tended to reach out for UE support to stabilize  Ambulation/Gait Ambulation/Gait assistance: Min guard Gait Distance (Feet): 150 Feet Assistive device: Rolling walker (2 wheeled) Gait Pattern/deviations: Step-through  pattern;Decreased step length - right;Decreased step length - left;Decreased stride length     General Gait Details: Cues to self-monitor for activity tolerance; 3 standing rest breaks with focused breathing due to dipping O2 sats   Stairs             Wheelchair Mobility    Modified Rankin (Stroke Patients Only)       Balance Overall balance assessment: Mild deficits observed, not formally tested                                          Cognition Arousal/Alertness: Awake/alert Behavior During Therapy: WFL for tasks assessed/performed Overall Cognitive Status: Within Functional Limits for tasks assessed                                        Exercises      General Comments General comments (skin integrity, edema, etc.): See other PT note of this date      Pertinent Vitals/Pain Pain Assessment: No/denies pain    Home Living                      Prior Function            PT Goals (current goals can now be found in the care plan section) Acute Rehab PT Goals Patient Stated Goal: Hopes to get home tomorrow PT Goal Formulation: With patient Time  For Goal Achievement: 02/12/19 Potential to Achieve Goals: Good Progress towards PT goals: Progressing toward goals    Frequency    Min 3X/week      PT Plan Current plan remains appropriate    Co-evaluation              AM-PAC PT "6 Clicks" Mobility   Outcome Measure  Help needed turning from your back to your side while in a flat bed without using bedrails?: None Help needed moving from lying on your back to sitting on the side of a flat bed without using bedrails?: None Help needed moving to and from a bed to a chair (including a wheelchair)?: None Help needed standing up from a chair using your arms (e.g., wheelchair or bedside chair)?: A Little Help needed to walk in hospital room?: A Little Help needed climbing 3-5 steps with a railing? : A Little 6  Click Score: 21    End of Session Equipment Utilized During Treatment: Gait belt;Oxygen Activity Tolerance: Patient tolerated treatment well Patient left: in chair;with call bell/phone within reach Nurse Communication: Mobility status PT Visit Diagnosis: Other abnormalities of gait and mobility (R26.89)     Time: 4196-2229 PT Time Calculation (min) (ACUTE ONLY): 21 min  Charges:  $Gait Training: 8-22 mins                     Roney Marion, [place] Pager [phone] Office [place] 01/31/2019, 10:42 AM

## 2019-01-31 NOTE — Discharge Summary (Addendum)
Physician Discharge Summary  Amanda Berry KXF:[id number] DOB: Sep 28, 1933 DOA: 01/25/2019  PCP: Shirline Frees, MD  Admit date: 01/25/2019 Discharge date: 01/31/2019  Admitted From: Home Disposition: Home  Recommendations for Outpatient Follow-up:  1. Follow up with PCP in 1-2 weeks 2. Please obtain BMP/CBC in one week 3. Please follow up with cardiology 4. Patient needs a follow-up chest x-ray in 4 weeks to see resolution of pneumonia  Home Health yes Equipment/Devices: Oxygen at 2 L Discharge Condition: Stable and improved CODE STATUS full code Diet recommendation: Cardiac  Brief/Interim Summary:85 year old female with history of hypertension, overactive bladder presented to [facility] ED on 5/28 with high fevers up to 103, productive cough sore throat headaches and shortness of breath, symptoms ongoing for about 5 days. -She tested negative for COVID x2 -Admitted with sepsis from bilateral multifocal pneumonia she was positive for urine Legionella antigen, also found to have new onset rapid A. fib this admission, followed by cardiology, started on Cardizem and Eliquis for this. -Transferred to my care today  Discharge Diagnoses:  Active Problems:   Other specified sepsis ([place])   Acute respiratory failure with hypoxia (HCC)   AKI (acute kidney injury) ([place])   Suspected Covid-19 Virus Infection   Hyponatremia   PAF (paroxysmal atrial fibrillation) (HCC)   LFT elevation   Lobar pneumonia (HCC)  Sepsis, bilateral multifocal pneumonia  -Had lactic acidosis and procalcitonin of 6.3 on admission, which has trended down-highly suggestive of bacterial pneumonia from Legionella -Urine Legionella antigen was positive -Covid PCR was negative x2 -Initial x-ray did not show acute findings however CT chest noted bilateral pneumonia with complete consolidation of right lower lobe and small pleural effusions -She was treated with broad-spectrum antibiotics initially and then switched to  levofloxacin.  She will continue levofloxacin for 3 more doses after discharge. -Blood cultures are negative  Acute hypoxic respiratory failure -Secondary to extensive multifocal pneumonia from Legionella.  Patient saturation dropped to 82% on room air while ambulating.  She will be prescribed oxygen upon discharge.  AKI on CKDIII  -improving, creatinine down from 2.1-1.2 with hydration -Lisinopril discontinued  Hyponatremia-resolved with hydration  Elevated lft: (tbili, alk phos wnl, mild elevated ast/alt ), ck wnl -Likely due to sepsis, Legionella -ab us"Early cirrhotic change characterized by a nodular contour of theleft lobe of the liver is suggested" -acute hepatitis panel negative  New onset of aflutter/afib -Likely triggered by extensive pneumonia -Briefly required Cardizem drip, continue p.o. Cardizem upon discharge follow-up with cardiology continue apixaban for anticoagulation.-2D echo noted preserved EF.  Red yeast rice has been stopped as it has blood thinning properties since she is started on anticoagulation.  HTN:  Was on lisinopril at home, lisinopril held on presentation due to aki -BP stable on diltiazem continue Cardizem  Bilateral lower extremity edema possibly iatrogenic advised her to keep her feet elevated she received 1 dose of Lasix yesterday. Estimated body mass index is 31.25 kg/m as calculated from the following:   Height as of this encounter: 5' (1.524 m).   Weight as of this encounter: 72.6 kg.  Discharge Instructions  Discharge Instructions    Call MD for:  difficulty breathing, headache or visual disturbances   Complete by:  As directed    Call MD for:  persistant nausea and vomiting   Complete by:  As directed    Call MD for:  severe uncontrolled pain   Complete by:  As directed    Call MD for:  temperature >100.4   Complete by:  As directed    Diet - low sodium heart healthy   Complete by:  As directed    For home use only DME 4  wheeled rolling walker with seat   Complete by:  As directed    Patient needs a walker to treat with the following condition:  Unsteady gait   For home use only DME oxygen   Complete by:  As directed    Length of Need:  6 Months   Mode or (Route):  Nasal cannula   Liters per Minute:  2   Frequency:  Continuous (stationary and portable oxygen unit needed)   Oxygen conserving device:  Yes   Oxygen delivery system:  Gas   Increase activity slowly   Complete by:  As directed      Allergies as of 01/31/2019   No Known Allergies     Medication List    STOP taking these medications   lisinopril 20 MG tablet Commonly known as:  ZESTRIL   ProCel Powd   ranitidine 300 MG tablet Commonly known as:  ZANTAC Replaced by:  famotidine 20 MG tablet   Red Yeast Rice 600 MG Caps     TAKE these medications   acetaminophen 500 MG tablet Commonly known as:  TYLENOL Take 500 mg by mouth every 6 (six) hours as needed for mild pain.   alendronate 70 MG tablet Commonly known as:  FOSAMAX Take 70 mg by mouth once a week.   apixaban 5 MG Tabs tablet Commonly known as:  ELIQUIS Take 1 tablet (5 mg total) by mouth 2 (two) times daily.   diltiazem 120 MG 24 hr capsule Commonly known as:  CARDIZEM CD Take 1 capsule (120 mg total) by mouth daily. Start taking on:  February 01, 2019   famotidine 20 MG tablet Commonly known as:  PEPCID Take 1 tablet (20 mg total) by mouth daily. Start taking on:  February 01, 2019 Replaces:  ranitidine 300 MG tablet   levofloxacin 750 MG tablet Commonly known as:  LEVAQUIN Take 1 tablet every other night till done.  Take your first dose tonight.   oxybutynin 5 MG tablet Commonly known as:  DITROPAN Take 5 mg by mouth at bedtime.            Durable Medical Equipment  (From admission, onward)         Start     Ordered   01/31/19 0000  For home use only DME oxygen    Question Answer Comment  Length of Need 6 Months   Mode or (Route) Nasal cannula    Liters per Minute 2   Frequency Continuous (stationary and portable oxygen unit needed)   Oxygen conserving device Yes   Oxygen delivery system Gas      01/31/19 1034   01/31/19 0000  For home use only DME 4 wheeled rolling walker with seat    Question:  Patient needs a walker to treat with the following condition  Answer:  Unsteady gait   01/31/19 1034         Follow-up Information    Lelon Perla, MD Follow up.   Specialty:  Cardiology Why:  The office will contact you within 2-3 business days to arrange Cardiology follow-up. If you do not hear from them within this time frame, please call the number provided.  Contact information: [address] [place] [address] [phone]        Shirline Frees, MD Follow up in 1  week(s).   Specialty:  Family Medicine Why:  hospital discharge follow up, repeat cbc/bmp at follow up. repeat chest x ray in 3-4 weeks to ensure resolution of pneumonia.  Contact information: [place] [address] [phone]          No Known Allergies  Consultations:  Cardiology   Procedures/Studies: Ct Chest Wo Contrast  Result Date: 01/27/2019 CLINICAL DATA:  Pneumonia, unresolved for complicated EXAM: CT CHEST WITHOUT CONTRAST TECHNIQUE: Multidetector CT imaging of the chest was performed following the standard protocol without IV contrast. COMPARISON:  Chest x-ray from earlier today FINDINGS: Cardiovascular: Normal heart size. No pericardial effusion. There is extensive aortic and coronary atherosclerosis. Mediastinum/Nodes: Negative for adenopathy or mass. Lungs/Pleura: Dense consolidation without volume loss in the right lower lobe. Airspace opacity with volume loss in the left lower lobe. Small pleural effusions on both sides. Upper Abdomen: Negative Musculoskeletal: Generalized spinal degeneration with focally advanced disc degeneration at T12-L1. IMPRESSION: 1. Bilateral pneumonia with  complete consolidation of the right lower lobe. 2. Small pleural effusions. Electronically Signed   By: Monte Fantasia M.D.   On: 01/27/2019 17:45   Dg Chest Port 1 View  Result Date: 01/27/2019 CLINICAL DATA:  Cough, hypertension EXAM: PORTABLE CHEST 1 VIEW COMPARISON:  01/25/2019 FINDINGS: Normal heart size and vascularity. Left lung remains clear. Increased right basilar opacity, suspect right lower lobe airspace process and developing posterior layering effusion. Difficult to exclude right basilar pneumonia. Negative for edema or pneumothorax. Trachea midline. Aorta atherosclerotic. Degenerative changes of the spine. Bones are osteopenic. IMPRESSION: Right basilar opacity, suspect right lower lobe airspace process and developing right posterior layering effusion. Difficult to exclude pneumonia. Aortic atherosclerosis Electronically Signed   By: Jerilynn Mages.  Shick M.D.   On: 01/27/2019 16:49   Dg Chest Port 1 View  Result Date: 01/25/2019 CLINICAL DATA:  Fever EXAM: PORTABLE CHEST 1 VIEW COMPARISON:  04/11/2016 FINDINGS: The heart size and mediastinal contours are within normal limits. Both lungs are clear. The visualized skeletal structures are unremarkable. IMPRESSION: No active disease. Electronically Signed   By: Kathreen Devoid   On: 01/25/2019 16:43   US Abdomen Limited Ruq  Result Date: 01/28/2019 CLINICAL DATA:  Elevated liver function tests EXAM: ULTRASOUND ABDOMEN LIMITED RIGHT UPPER QUADRANT COMPARISON:  None. FINDINGS: Gallbladder: No gallstones or wall thickening visualized. No sonographic Murphy sign noted by sonographer. A small amount of pericholecystic fluid is nonspecific. Common bile duct: Diameter: 4 mm Liver: Contour of the left lobe is somewhat nodular suggesting early cirrhotic change. No focal mass. Cortical echogenicity is within normal limits. Portal vein is patent on color Doppler imaging with normal direction of blood flow towards the liver. Additional findings: Right pleural effusion  is noted IMPRESSION: Nonspecific pericholecystic fluid. This can be seen with volume overload states. Early cirrhotic change characterized by a nodular contour of the left lobe of the liver is suggested. Right pleural effusion. Electronically Signed   By: Marybelle Killings M.D.   On: 01/28/2019 07:59    (Echo, Carotid, EGD, Colonoscopy, ERCP)    Subjective: Patient resting in bed anxious to go home denies any new complaints other than lower extremity swelling  Discharge Exam: Vitals:   01/30/19 2321 01/31/19 0721  BP: (!) 106/57 (!) 146/71  Pulse: 73 78  Resp:    Temp: 98.2 F (36.8 C) 97.9 F (36.6 C)  SpO2: 95% 92%   Vitals:   01/30/19 0716 01/30/19 1830 01/30/19 2321 01/31/19 0721  BP: 106/73 125/73 Marland Kitchen)  106/57 (!) 146/71  Pulse: 82  73 78  Resp:      Temp: 98.3 F (36.8 C) 98.8 F (37.1 C) 98.2 F (36.8 C) 97.9 F (36.6 C)  TempSrc: Oral Oral Oral Oral  SpO2: 95% 93% 95% 92%  Weight:      Height:        General: Pt is alert, awake, not in acute distress Cardiovascular: RRR, S1/S2 +, no rubs, no gallops Respiratory: Decreased breath sounds in the bases with scattered rhonchi bilaterally, no wheezing, no rhonchi Abdominal: Soft, NT, ND, bowel sounds + Extremities: Bilateral 2+ pitting edema   The results of significant diagnostics from this hospitalization (including imaging, microbiology, ancillary and laboratory) are listed below for reference.     Microbiology: Recent Results (from the past 240 hour(s))  Blood culture (routine x 2)     Status: None   Collection Time: 01/25/19  2:11 PM  Result Value Ref Range Status   Specimen Description BLOOD RIGHT ANTECUBITAL  Final   Special Requests   Final    BOTTLES DRAWN AEROBIC AND ANAEROBIC Blood Culture adequate volume   Culture   Final    NO GROWTH 5 DAYS Performed at [facility], [address], [place], [place] [address]    Report Status 01/30/2019 FINAL  Final  Blood culture (routine x 2)     Status: None    Collection Time: 01/25/19  2:16 PM  Result Value Ref Range Status   Specimen Description BLOOD LEFT ANTECUBITAL  Final   Special Requests   Final    AEROBIC BOTTLE ONLY Blood Culture results may not be optimal due to an inadequate volume of blood received in culture bottles   Culture   Final    NO GROWTH 5 DAYS Performed at [facility], [place] [address] [place], [place] [address]    Report Status 01/30/2019 FINAL  Final  SARS Coronavirus 2 (CEPHEID- Performed in [place] hospital lab), Hosp Order     Status: None   Collection Time: 01/25/19  2:57 PM  Result Value Ref Range Status   SARS Coronavirus 2 NEGATIVE NEGATIVE Final    Comment: (NOTE) If result is NEGATIVE SARS-CoV-2 target nucleic acids are NOT DETECTED. The SARS-CoV-2 RNA is generally detectable in upper and lower  respiratory specimens during the acute phase of infection. The lowest  concentration of SARS-CoV-2 viral copies this assay can detect is 250  copies / mL. A negative result does not preclude SARS-CoV-2 infection  and should not be used as the sole basis for treatment or other  patient management decisions.  A negative result may occur with  improper specimen collection / handling, submission of specimen other  than nasopharyngeal swab, presence of viral mutation(s) within the  areas targeted by this assay, and inadequate number of viral copies  (<250 copies / mL). A negative result must be combined with clinical  observations, patient history, and epidemiological information. If result is POSITIVE SARS-CoV-2 target nucleic acids are DETECTED. The SARS-CoV-2 RNA is generally detectable in upper and lower  respiratory specimens dur ing the acute phase of infection.  Positive  results are indicative of active infection with SARS-CoV-2.  Clinical  correlation with patient history and other diagnostic information is  necessary to determine patient infection status.  Positive results do  not rule out  bacterial infection or co-infection with other viruses. If result is PRESUMPTIVE POSTIVE SARS-CoV-2 nucleic acids MAY BE PRESENT.   A presumptive positive result  was obtained on the submitted specimen  and confirmed on repeat testing.  While 2019 novel coronavirus  (SARS-CoV-2) nucleic acids may be present in the submitted sample  additional confirmatory testing may be necessary for epidemiological  and / or clinical management purposes  to differentiate between  SARS-CoV-2 and other Sarbecovirus currently known to infect humans.  If clinically indicated additional testing with an alternate test  methodology [phone]) is advised. The SARS-CoV-2 RNA is generally  detectable in upper and lower respiratory sp ecimens during the acute  phase of infection. The expected result is Negative. Fact Sheet for Patients:  StrictlyIdeas.no Fact Sheet for Healthcare Providers: BankingDealers.co.za This test is not yet approved or cleared by the [place] FDA and has been authorized for detection and/or diagnosis of SARS-CoV-2 by FDA under an Emergency Use Authorization (EUA).  This EUA will remain in effect (meaning this test can be used) for the duration of the COVID-19 declaration under Section 564(b)(1) of the Act, 21 U.S.C. section 360bbb-3(b)(1), unless the authorization is terminated or revoked sooner. Performed at [facility], [place] [address], [place], [place] [address]   Urine culture     Status: Abnormal   Collection Time: 01/25/19  5:30 PM  Result Value Ref Range Status   Specimen Description URINE, RANDOM  Final   Special Requests   Final    NONE Performed at [facility], [place] [address] [place], [place] [address]    Culture MULTIPLE SPECIES PRESENT, SUGGEST RECOLLECTION (A)  Final   Report Status 01/26/2019 FINAL  Final  Novel Coronavirus, NAA (hospital order; send-out to ref lab)     Status: None   Collection Time:  01/25/19  6:49 PM  Result Value Ref Range Status   SARS-CoV-2, NAA NOT DETECTED NOT DETECTED Final    Comment: (NOTE) This test was developed and its performance characteristics determined by [employer]. This test has not been FDA cleared or approved. This test has been authorized by FDA under an Emergency Use Authorization (EUA). This test is only authorized for the duration of time the declaration that circumstances exist justifying the authorization of the emergency use of in vitro diagnostic tests for detection of SARS-CoV-2 virus and/or diagnosis of COVID-19 infection under section 564(b)(1) of the Act, 21 U.S.C. 010OFH-2(R)(9), unless the authorization is terminated or revoked sooner. When diagnostic testing is negative, the possibility of a false negative result should be considered in the context of a patient's recent exposures and the presence of clinical signs and symptoms consistent with COVID-19. An individual without symptoms of COVID-19 and who is not shedding SARS-CoV-2 virus would expect to have a negative (not detected) result in this assay. Performed  At: [facility] [address] [place], [place] [id number] Rush Farmer MD IY:[id number]    [place]  Final    Comment: Performed at [facility], [place] [address] [place], [place] [address]  MRSA PCR Screening     Status: None   Collection Time: 01/25/19  9:27 PM  Result Value Ref Range Status   MRSA by PCR NEGATIVE NEGATIVE Final    Comment:        The GeneXpert MRSA Assay (FDA approved for NASAL specimens only), is one component of a comprehensive MRSA colonization surveillance program. It is not intended to diagnose MRSA infection nor to guide or monitor treatment for MRSA infections. Performed at [facility], [place] [address], [place],  [address]   Respiratory Panel by  PCR     Status: None   Collection Time: 01/26/19  7:29 AM  Result Value  Ref Range Status   Adenovirus NOT DETECTED NOT DETECTED Final   Coronavirus 229E NOT DETECTED NOT DETECTED Final    Comment: (NOTE) The Coronavirus on the Respiratory Panel, DOES NOT test for the novel  Coronavirus (2019 nCoV)    Coronavirus HKU1 NOT DETECTED NOT DETECTED Final   Coronavirus NL63 NOT DETECTED NOT DETECTED Final   Coronavirus OC43 NOT DETECTED NOT DETECTED Final   Metapneumovirus NOT DETECTED NOT DETECTED Final   Rhinovirus / Enterovirus NOT DETECTED NOT DETECTED Final   Influenza A NOT DETECTED NOT DETECTED Final   Influenza B NOT DETECTED NOT DETECTED Final   Parainfluenza Virus 1 NOT DETECTED NOT DETECTED Final   Parainfluenza Virus 2 NOT DETECTED NOT DETECTED Final   Parainfluenza Virus 3 NOT DETECTED NOT DETECTED Final   Parainfluenza Virus 4 NOT DETECTED NOT DETECTED Final   Respiratory Syncytial Virus NOT DETECTED NOT DETECTED Final   Bordetella pertussis NOT DETECTED NOT DETECTED Final   Chlamydophila pneumoniae NOT DETECTED NOT DETECTED Final   Mycoplasma pneumoniae NOT DETECTED NOT DETECTED Final    Comment: Performed at [facility], [place]. [address] [place],  [address]  SARS Coronavirus 2 (CEPHEID- Performed in [place] hospital lab), Hosp Order     Status: None   Collection Time: 01/27/19  1:24 PM  Result Value Ref Range Status   SARS Coronavirus 2 NEGATIVE NEGATIVE Final    Comment: (NOTE) If result is NEGATIVE SARS-CoV-2 target nucleic acids are NOT DETECTED. The SARS-CoV-2 RNA is generally detectable in upper and lower  respiratory specimens during the acute phase of infection. The lowest  concentration of SARS-CoV-2 viral copies this assay can detect is 250  copies / mL. A negative result does not preclude SARS-CoV-2 infection  and should not be used as the sole basis for treatment or other  patient management decisions.  A negative result may occur with  improper specimen collection / handling, submission of specimen other  than  nasopharyngeal swab, presence of viral mutation(s) within the  areas targeted by this assay, and inadequate number of viral copies  (<250 copies / mL). A negative result must be combined with clinical  observations, patient history, and epidemiological information. If result is POSITIVE SARS-CoV-2 target nucleic acids are DETECTED. The SARS-CoV-2 RNA is generally detectable in upper and lower  respiratory specimens dur ing the acute phase of infection.  Positive  results are indicative of active infection with SARS-CoV-2.  Clinical  correlation with patient history and other diagnostic information is  necessary to determine patient infection status.  Positive results do  not rule out bacterial infection or co-infection with other viruses. If result is PRESUMPTIVE POSTIVE SARS-CoV-2 nucleic acids MAY BE PRESENT.   A presumptive positive result was obtained on the submitted specimen  and confirmed on repeat testing.  While 2019 novel coronavirus  (SARS-CoV-2) nucleic acids may be present in the submitted sample  additional confirmatory testing may be necessary for epidemiological  and / or clinical management purposes  to differentiate between  SARS-CoV-2 and other Sarbecovirus currently known to infect humans.  If clinically indicated additional testing with an alternate test  methodology [phone]) is advised. The SARS-CoV-2 RNA is generally  detectable in upper and lower respiratory sp ecimens during the acute  phase of infection. The expected result is Negative. Fact Sheet for Patients:  StrictlyIdeas.no Fact Sheet for Healthcare Providers:  BankingDealers.co.za This test is not yet approved or cleared by the [place] and has been authorized for detection and/or diagnosis of SARS-CoV-2 by FDA under an Emergency Use Authorization (EUA).  This EUA will remain in effect (meaning this test can be used) for the duration of  the COVID-19 declaration under Section 564(b)(1) of the Act, 21 U.S.C. section 360bbb-3(b)(1), unless the authorization is terminated or revoked sooner. Performed at [facility], [place] [address] [place], [place] [address]      Labs: BNP (last 3 results) No results for input(s): BNP in the last 8760 hours. Basic Metabolic Panel: Recent Labs  Lab 01/27/19 0636 01/28/19 0706 01/29/19 0707 01/30/19 0456 01/31/19 0447  NA 131* 131* 132* 135 135  K 3.7 3.5 3.5 3.6 3.6  CL 101 102 104 102 103  CO2 17* 16* 17* 22 19*  GLUCOSE 97 93 89 99 88  BUN 35* 28* 25* 23 22  CREATININE 1.57* 1.37* 1.33* 1.38* 1.25*  CALCIUM 7.3* 7.5* 7.8* 8.1* 8.1*   Liver Function Tests: Recent Labs  Lab 01/27/19 0636 01/28/19 0706 01/29/19 0707 01/30/19 0456 01/31/19 0447  AST 65* 70* 85* 83* 132*  ALT 47* 46* 55* 59* 82*  ALKPHOS 80 112 148* 172* 203*  BILITOT 0.6 1.0 0.9 0.9 0.7  PROT 5.2* 5.4* 5.6* 5.2* 5.4*  ALBUMIN 1.8* 1.7* 1.9* 1.8* 2.0*   No results for input(s): LIPASE, AMYLASE in the last 168 hours. No results for input(s): AMMONIA in the last 168 hours. CBC: Recent Labs  Lab 01/25/19 1411 01/25/19 2039 01/27/19 0636 01/28/19 0706 01/29/19 0707 01/30/19 0456 01/31/19 0447  WBC 9.9 8.5 7.1 9.8 11.1* 12.4* 11.9*  NEUTROABS 9.2* 7.7 6.0 8.4* 8.9*  --   --   HGB 11.5* 11.0* 10.6* 9.9* 11.4* 10.2* 10.5*  HCT 35.2* 33.0* 30.9* 29.6* 33.6* 29.9* 30.6*  MCV 93.9 92.4 90.4 90.2 90.6 90.1 90.0  PLT 154 143* PLATELET CLUMPS NOTED ON SMEAR, UNABLE TO ESTIMATE 228 267 314 364   Cardiac Enzymes: Recent Labs  Lab 01/28/19 0706  CKTOTAL 93   BNP: Invalid input(s): POCBNP CBG: No results for input(s): GLUCAP in the last 168 hours. D-Dimer No results for input(s): DDIMER in the last 72 hours. Hgb A1c No results for input(s): HGBA1C in the last 72 hours. Lipid Profile No results for input(s): CHOL, HDL, LDLCALC, TRIG, CHOLHDL, LDLDIRECT in the last 72 hours. Thyroid  function studies No results for input(s): TSH, T4TOTAL, T3FREE, THYROIDAB in the last 72 hours.  Invalid input(s): FREET3 Anemia work up No results for input(s): VITAMINB12, FOLATE, FERRITIN, TIBC, IRON, RETICCTPCT in the last 72 hours. Urinalysis    Component Value Date/Time   COLORURINE AMBER (A) 01/25/2019 1730   APPEARANCEUR CLOUDY (A) 01/25/2019 1730   LABSPEC 1.018 01/25/2019 1730   PHURINE 5.0 01/25/2019 1730   GLUCOSEU NEGATIVE 01/25/2019 1730   HGBUR SMALL (A) 01/25/2019 1730   BILIRUBINUR NEGATIVE 01/25/2019 1730   KETONESUR NEGATIVE 01/25/2019 1730   PROTEINUR 30 (A) 01/25/2019 1730   UROBILINOGEN 0.2 03/01/2007 1131   NITRITE NEGATIVE 01/25/2019 1730   LEUKOCYTESUR TRACE (A) 01/25/2019 1730   Sepsis Labs Invalid input(s): PROCALCITONIN,  WBC,  LACTICIDVEN Microbiology Recent Results (from the past 240 hour(s))  Blood culture (routine x 2)     Status: None   Collection Time: 01/25/19  2:11 PM  Result Value Ref Range Status   Specimen Description BLOOD RIGHT ANTECUBITAL  Final   Special Requests   Final  BOTTLES DRAWN AEROBIC AND ANAEROBIC Blood Culture adequate volume   Culture   Final    NO GROWTH 5 DAYS Performed at [facility], [place] [address], [place], [place] [address]    Report Status 01/30/2019 FINAL  Final  Blood culture (routine x 2)     Status: None   Collection Time: 01/25/19  2:16 PM  Result Value Ref Range Status   Specimen Description BLOOD LEFT ANTECUBITAL  Final   Special Requests   Final    AEROBIC BOTTLE ONLY Blood Culture results may not be optimal due to an inadequate volume of blood received in culture bottles   Culture   Final    NO GROWTH 5 DAYS Performed at [facility], [place] [address] [place], [place] [address]    Report Status 01/30/2019 FINAL  Final  SARS Coronavirus 2 (CEPHEID- Performed in [place] hospital lab), Hosp Order     Status: None   Collection Time: 01/25/19  2:57 PM  Result Value Ref Range Status    SARS Coronavirus 2 NEGATIVE NEGATIVE Final    Comment: (NOTE) If result is NEGATIVE SARS-CoV-2 target nucleic acids are NOT DETECTED. The SARS-CoV-2 RNA is generally detectable in upper and lower  respiratory specimens during the acute phase of infection. The lowest  concentration of SARS-CoV-2 viral copies this assay can detect is 250  copies / mL. A negative result does not preclude SARS-CoV-2 infection  and should not be used as the sole basis for treatment or other  patient management decisions.  A negative result may occur with  improper specimen collection / handling, submission of specimen other  than nasopharyngeal swab, presence of viral mutation(s) within the  areas targeted by this assay, and inadequate number of viral copies  (<250 copies / mL). A negative result must be combined with clinical  observations, patient history, and epidemiological information. If result is POSITIVE SARS-CoV-2 target nucleic acids are DETECTED. The SARS-CoV-2 RNA is generally detectable in upper and lower  respiratory specimens dur ing the acute phase of infection.  Positive  results are indicative of active infection with SARS-CoV-2.  Clinical  correlation with patient history and other diagnostic information is  necessary to determine patient infection status.  Positive results do  not rule out bacterial infection or co-infection with other viruses. If result is PRESUMPTIVE POSTIVE SARS-CoV-2 nucleic acids MAY BE PRESENT.   A presumptive positive result was obtained on the submitted specimen  and confirmed on repeat testing.  While 2019 novel coronavirus  (SARS-CoV-2) nucleic acids may be present in the submitted sample  additional confirmatory testing may be necessary for epidemiological  and / or clinical management purposes  to differentiate between  SARS-CoV-2 and other Sarbecovirus currently known to infect humans.  If clinically indicated additional testing with an alternate test   methodology [phone]) is advised. The SARS-CoV-2 RNA is generally  detectable in upper and lower respiratory sp ecimens during the acute  phase of infection. The expected result is Negative. Fact Sheet for Patients:  StrictlyIdeas.no Fact Sheet for Healthcare Providers: BankingDealers.co.za This test is not yet approved or cleared by the [place] FDA and has been authorized for detection and/or diagnosis of SARS-CoV-2 by FDA under an Emergency Use Authorization (EUA).  This EUA will remain in effect (meaning this test can be used) for the duration of the COVID-19 declaration under Section 564(b)(1) of the Act, 21 U.S.C. section 360bbb-3(b)(1), unless the authorization is terminated or revoked sooner. Performed at  [facility], [place] [address] [place], [place] [address]   Urine culture     Status: Abnormal   Collection Time: 01/25/19  5:30 PM  Result Value Ref Range Status   Specimen Description URINE, RANDOM  Final   Special Requests   Final    NONE Performed at [facility], [place] [address] [place], [place] [address]    Culture MULTIPLE SPECIES PRESENT, SUGGEST RECOLLECTION (A)  Final   Report Status 01/26/2019 FINAL  Final  Novel Coronavirus, NAA (hospital order; send-out to ref lab)     Status: None   Collection Time: 01/25/19  6:49 PM  Result Value Ref Range Status   SARS-CoV-2, NAA NOT DETECTED NOT DETECTED Final    Comment: (NOTE) This test was developed and its performance characteristics determined by [employer]. This test has not been FDA cleared or approved. This test has been authorized by FDA under an Emergency Use Authorization (EUA). This test is only authorized for the duration of time the declaration that circumstances exist justifying the authorization of the emergency use of in vitro diagnostic tests for detection of SARS-CoV-2 virus and/or diagnosis of COVID-19 infection under section  564(b)(1) of the Act, 21 U.S.C. 209OBS-9(G)(2), unless the authorization is terminated or revoked sooner. When diagnostic testing is negative, the possibility of a false negative result should be considered in the context of a patient's recent exposures and the presence of clinical signs and symptoms consistent with COVID-19. An individual without symptoms of COVID-19 and who is not shedding SARS-CoV-2 virus would expect to have a negative (not detected) result in this assay. Performed  At: [facility] [address] [place], [place] [id number] Rush Farmer MD LY:[id number]    [place]  Final    Comment: Performed at [facility], [place] [address] [place], [place] [address]  MRSA PCR Screening     Status: None   Collection Time: 01/25/19  9:27 PM  Result Value Ref Range Status   MRSA by PCR NEGATIVE NEGATIVE Final    Comment:        The GeneXpert MRSA Assay (FDA approved for NASAL specimens only), is one component of a comprehensive MRSA colonization surveillance program. It is not intended to diagnose MRSA infection nor to guide or monitor treatment for MRSA infections. Performed at [facility], [place] [address] [place], [place] [address]   Respiratory Panel by PCR     Status: None   Collection Time: 01/26/19  7:29 AM  Result Value Ref Range Status   Adenovirus NOT DETECTED NOT DETECTED Final   Coronavirus 229E NOT DETECTED NOT DETECTED Final    Comment: (NOTE) The Coronavirus on the Respiratory Panel, DOES NOT test for the novel  Coronavirus (2019 nCoV)    Coronavirus HKU1 NOT DETECTED NOT DETECTED Final   Coronavirus NL63 NOT DETECTED NOT DETECTED Final   Coronavirus OC43 NOT DETECTED NOT DETECTED Final   Metapneumovirus NOT DETECTED NOT DETECTED Final   Rhinovirus / Enterovirus NOT DETECTED NOT DETECTED Final   Influenza A NOT DETECTED NOT DETECTED Final   Influenza B NOT DETECTED NOT DETECTED Final   Parainfluenza  Virus 1 NOT DETECTED NOT DETECTED Final   Parainfluenza Virus 2 NOT DETECTED NOT DETECTED Final   Parainfluenza Virus 3 NOT DETECTED NOT DETECTED Final   Parainfluenza Virus 4 NOT DETECTED NOT DETECTED Final   Respiratory Syncytial Virus NOT DETECTED NOT DETECTED Final   Bordetella pertussis NOT DETECTED NOT DETECTED  Final   Chlamydophila pneumoniae NOT DETECTED NOT DETECTED Final   Mycoplasma pneumoniae NOT DETECTED NOT DETECTED Final    Comment: Performed at [facility], [place] [address] [place], [place] [address]  SARS Coronavirus 2 (CEPHEID- Performed in [place] hospital lab), Hosp Order     Status: None   Collection Time: 01/27/19  1:24 PM  Result Value Ref Range Status   SARS Coronavirus 2 NEGATIVE NEGATIVE Final    Comment: (NOTE) If result is NEGATIVE SARS-CoV-2 target nucleic acids are NOT DETECTED. The SARS-CoV-2 RNA is generally detectable in upper and lower  respiratory specimens during the acute phase of infection. The lowest  concentration of SARS-CoV-2 viral copies this assay can detect is 250  copies / mL. A negative result does not preclude SARS-CoV-2 infection  and should not be used as the sole basis for treatment or other  patient management decisions.  A negative result may occur with  improper specimen collection / handling, submission of specimen other  than nasopharyngeal swab, presence of viral mutation(s) within the  areas targeted by this assay, and inadequate number of viral copies  (<250 copies / mL). A negative result must be combined with clinical  observations, patient history, and epidemiological information. If result is POSITIVE SARS-CoV-2 target nucleic acids are DETECTED. The SARS-CoV-2 RNA is generally detectable in upper and lower  respiratory specimens dur ing the acute phase of infection.  Positive  results are indicative of active infection with SARS-CoV-2.  Clinical  correlation with patient history and other diagnostic information  is  necessary to determine patient infection status.  Positive results do  not rule out bacterial infection or co-infection with other viruses. If result is PRESUMPTIVE POSTIVE SARS-CoV-2 nucleic acids MAY BE PRESENT.   A presumptive positive result was obtained on the submitted specimen  and confirmed on repeat testing.  While 2019 novel coronavirus  (SARS-CoV-2) nucleic acids may be present in the submitted sample  additional confirmatory testing may be necessary for epidemiological  and / or clinical management purposes  to differentiate between  SARS-CoV-2 and other Sarbecovirus currently known to infect humans.  If clinically indicated additional testing with an alternate test  methodology [phone]) is advised. The SARS-CoV-2 RNA is generally  detectable in upper and lower respiratory sp ecimens during the acute  phase of infection. The expected result is Negative. Fact Sheet for Patients:  StrictlyIdeas.no Fact Sheet for Healthcare Providers: BankingDealers.co.za This test is not yet approved or cleared by the [place] FDA and has been authorized for detection and/or diagnosis of SARS-CoV-2 by FDA under an Emergency Use Authorization (EUA).  This EUA will remain in effect (meaning this test can be used) for the duration of the COVID-19 declaration under Section 564(b)(1) of the Act, 21 U.S.C. section 360bbb-3(b)(1), unless the authorization is terminated or revoked sooner. Performed at [facility], [place] [address] [place], [place] [address]      Time coordinating discharge: 33 minutes  SIGNED:   Georgette Shell, MD  [facility] 01/31/2019, 10:37 AM Pager   If 7PM-7AM, please contact night-coverage [email or web address] Password [email or web address]

## 2019-02-12 DIAGNOSIS — I480 Paroxysmal atrial fibrillation: Secondary | ICD-10-CM | POA: Diagnosis not present

## 2019-02-12 DIAGNOSIS — J189 Pneumonia, unspecified organism: Secondary | ICD-10-CM | POA: Diagnosis not present

## 2019-02-12 DIAGNOSIS — E7800 Pure hypercholesterolemia, unspecified: Secondary | ICD-10-CM | POA: Diagnosis not present

## 2019-02-12 DIAGNOSIS — K219 Gastro-esophageal reflux disease without esophagitis: Secondary | ICD-10-CM | POA: Diagnosis not present

## 2019-02-12 DIAGNOSIS — I10 Essential (primary) hypertension: Secondary | ICD-10-CM | POA: Diagnosis not present

## 2019-02-15 ENCOUNTER — Other Ambulatory Visit: Payer: Self-pay | Admitting: Family Medicine

## 2019-02-15 ENCOUNTER — Ambulatory Visit
Admission: RE | Admit: 2019-02-15 | Discharge: 2019-02-15 | Disposition: A | Discharge status: Home / Self Care | Payer: PPO | Source: Ambulatory Visit | Attending: Family Medicine | Admitting: Family Medicine

## 2019-02-15 ENCOUNTER — Other Ambulatory Visit: Payer: Self-pay

## 2019-02-15 DIAGNOSIS — J189 Pneumonia, unspecified organism: Secondary | ICD-10-CM

## 2019-02-21 ENCOUNTER — Telehealth: Payer: Self-pay | Admitting: Cardiology

## 2019-02-21 NOTE — Telephone Encounter (Signed)
Phone visit/no my chart/sent text to sign up for my chart/pre reg complete/consent obtained ttf

## 2019-02-26 NOTE — Telephone Encounter (Signed)
I called pt to confirm her appt for 02-27-19 with Kerin Ransom.

## 2019-02-27 ENCOUNTER — Telehealth: Payer: Self-pay

## 2019-02-27 ENCOUNTER — Telehealth (INDEPENDENT_AMBULATORY_CARE_PROVIDER_SITE_OTHER): Payer: PPO | Admitting: Cardiology

## 2019-02-27 ENCOUNTER — Encounter: Payer: Self-pay | Admitting: Cardiology

## 2019-02-27 VITALS — BP 114/63 | HR 66 | Ht 60.0 in | Wt 155.0 lb

## 2019-02-27 DIAGNOSIS — J9601 Acute respiratory failure with hypoxia: Secondary | ICD-10-CM

## 2019-02-27 DIAGNOSIS — I480 Paroxysmal atrial fibrillation: Secondary | ICD-10-CM

## 2019-02-27 DIAGNOSIS — I10 Essential (primary) hypertension: Secondary | ICD-10-CM

## 2019-02-27 DIAGNOSIS — J189 Pneumonia, unspecified organism: Secondary | ICD-10-CM | POA: Insufficient documentation

## 2019-02-27 DIAGNOSIS — N179 Acute kidney failure, unspecified: Secondary | ICD-10-CM

## 2019-02-27 DIAGNOSIS — Z7901 Long term (current) use of anticoagulants: Secondary | ICD-10-CM

## 2019-02-27 NOTE — Telephone Encounter (Signed)
Contacted patient to discuss AVS Instructions. Gave patient Amanda Berry's recommendations from today's virtual office visit. Informed patient that someone from the scheduling dept will be in contact with her to schedule her follow up appt. Patient voiced understanding and AVS mailed.    

## 2019-02-27 NOTE — Progress Notes (Signed)
Virtual Visit via Telephone Note   This visit type was conducted due to national recommendations for restrictions regarding the COVID-19 Pandemic (e.g. social distancing) in an effort to limit this patient's exposure and mitigate transmission in our community.  Due to her co-morbid illnesses, this patient is at least at moderate risk for complications without adequate follow up.  This format is felt to be most appropriate for this patient at this time.  The patient did not have access to video technology/had technical difficulties with video requiring transitioning to audio format only (telephone).  All issues noted in this document were discussed and addressed.  No physical exam could be performed with this format.  Please refer to the patient's chart for her  consent to telehealth for [facility].   Date:  02/27/2019   ID:  Amanda, Berry 1934/03/25, MRN [id number]  Patient Location: Home Provider Location: Office  PCP:  Shirline Frees, MD  Cardiologist:  Dr Stanford Breed Electrophysiologist:  None   Evaluation Performed:  Follow-Up Visit  Chief Complaint:  none  History of Present Illness:    Amanda Berry is a 85 y.o. female with a history of hypertension.  She was admitted to the hospital 01/25/2019 with acute respiratory failure.  Ultimately this was felt to be community-acquired pneumonia and sepsis.  Course was complicated by acute renal insufficiency and atrial fibrillation.  She was seen in consult by cardiology.  She was placed on diltiazem and ultimately Eliquis.  She was discharged on home O2. She was contacted today by phone for follow up.  Since discharge she has done well.  She is using O2 during the day but takes it off for an hour occasionally.  She says her O2 sat doesn't go below 92% off O2.  She denies any palpitations or tachycardia.  She is weak but feels like she is getting stronger.   The patient does not have symptoms concerning for COVID-19 infection (fever,  chills, cough, or new shortness of breath).    Past Medical History:  Diagnosis Date  . Constipation   . Fracture of femur, right, closed ([place]) 2017  . Hypertension   . Overactive bladder   . Symptomatic anemia 03/2016   Past Surgical History:  Procedure Laterality Date  . KNEE ARTHROPLASTY Right    approx 10 years ago  . ORIF PERIPROSTHETIC FRACTURE Right 04/12/2016   Procedure: OPEN REDUCTION INTERNAL FIXATION (ORIF) RIGHT DISTAL FEMUR  PERIPROSTHETIC FRACTURE;  Surgeon: Paralee Cancel, MD;  Location: [facility];  Service: Orthopedics;  Laterality: Right;  . ROTATOR CUFF REPAIR Right 1990's     Current Meds  Medication Sig  . acetaminophen (TYLENOL) 500 MG tablet Take 500 mg by mouth every 6 (six) hours as needed for mild pain.  Marland Kitchen alendronate (FOSAMAX) 70 MG tablet Take 70 mg by mouth once a week.  Marland Kitchen apixaban (ELIQUIS) 5 MG TABS tablet Take 1 tablet (5 mg total) by mouth 2 (two) times daily.  . Cholecalciferol (VITAMIN D3 PO) Take by mouth daily.  Marland Kitchen diltiazem (CARDIZEM CD) 120 MG 24 hr capsule Take 1 capsule (120 mg total) by mouth daily.  . famotidine (PEPCID) 20 MG tablet Take 1 tablet (20 mg total) by mouth daily.  Marland Kitchen POTASSIUM CITRATE PO Take by mouth daily.  . [DISCONTINUED] oxybutynin (DITROPAN) 5 MG tablet Take 5 mg by mouth at bedtime.      Allergies:   Patient has no known allergies.   Social History   Tobacco Use  .  Smoking status: Never Smoker  . Smokeless tobacco: Never Used  Substance Use Topics  . Alcohol use: No  . Drug use: No     Family Hx: The patient's family history includes Alcoholism in her brother; Colon cancer in her father; Hypertension in her mother; Kidney disease in her brother; Lung cancer in her sister; Valvular heart disease in her sister.  ROS:   Please see the history of present illness.    All other systems reviewed and are negative.   Prior CV studies:   The following studies were reviewed today: Echo May 2020  Labs/Other Tests and  Data Reviewed:    EKG:  No ECG reviewed.  Recent Labs: 01/31/2019: ALT 82; BUN 22; Creatinine, Ser 1.25; Hemoglobin 10.5; Platelets 364; Potassium 3.6; Sodium 135   Recent Lipid Panel No results found for: CHOL, TRIG, HDL, CHOLHDL, LDLCALC, LDLDIRECT  Wt Readings from Last 3 Encounters:  02/27/19 155 lb (70.3 kg)  01/25/19 160 lb (72.6 kg)  05/05/16 159 lb (72.1 kg)     Objective:    Vital Signs:  BP 114/63   Pulse 66   Ht 5' (1.524 m)   Wt 155 lb (70.3 kg)   SpO2 98%   BMI 30.27 kg/m    VITAL SIGNS:  reviewed  ASSESSMENT & PLAN:    PAF- New onset in the setting of CAP and sepsis.  Anticoagulated- CHADS VASC=4  H/O HTN She had been on an ACE- this was stopped secondary to AKI while hospitalized and the addition of Diltiazem.   COVID-19 Education: The signs and symptoms of COVID-19 were discussed with the patient and how to seek care for testing (follow up with PCP or arrange E-visit).  The importance of social distancing was discussed today.  Time:   Today, I have spent 15 minutes with the patient with telehealth technology discussing the above problems.     Medication Adjustments/Labs and Tests Ordered: Current medicines are reviewed at length with the patient today.  Concerns regarding medicines are outlined above.   Tests Ordered: No orders of the defined types were placed in this encounter.   Medication Changes: No orders of the defined types were placed in this encounter.   Follow Up:  In Person 2-3 months with Dr Stanford Breed.  OK to wean off O2 during the day if her O2 sat remains > 92%.  I would contnue O2 at night for now.   Signed, Kerin Ransom, PA-C  02/27/2019 11:07 AM    [place]

## 2019-02-27 NOTE — Patient Instructions (Signed)
Medication Instructions:  Your physician recommends that you continue on your current medications as directed. Please refer to the Current Medication list given to you today. If you need a refill on your cardiac medications before your next appointment, please call your pharmacy.   Lab work: None  If you have labs (blood work) drawn today and your tests are completely normal, you will receive your results only by: Marland Kitchen MyChart Message (if you have MyChart) OR . A paper copy in the mail If you have any lab test that is abnormal or we need to change your treatment, we will call you to review the results.  Testing/Procedures: None   Follow-Up: At [facility], you and your health needs are our priority.  As part of our continuing mission to provide you with exceptional heart care, we have created designated Provider Care Teams.  These Care Teams include your primary Cardiologist (physician) and Advanced Practice Providers (APPs -  Physician Assistants and Nurse Practitioners) who all work together to provide you with the care you need, when you need it. You will need a follow up appointment in 2-3 months.  Please call our office 2 months in advance to schedule this appointment.  You may see Dr Kirk Ruths or one of the following Advanced Practice Providers on your designated Care Team:   Kerin Ransom, PA-C Roby Lofts, [place] . Sande Rives, PA-C  Any Other Special Instructions Will Be Listed Below (If Applicable). OK TO WEAN OFF OXYGEN IN THE DAY TIME KEEP OXYGEN LEVEL ABOVE 92% YOU MUST STILL WEAR OXYGEN AT NIGHT

## 2019-03-06 ENCOUNTER — Ambulatory Visit
Admission: RE | Admit: 2019-03-06 | Discharge: 2019-03-06 | Disposition: A | Discharge status: Home / Self Care | Payer: PPO | Source: Ambulatory Visit | Attending: Family Medicine | Admitting: Family Medicine

## 2019-03-06 ENCOUNTER — Other Ambulatory Visit: Payer: Self-pay | Admitting: Family Medicine

## 2019-03-06 ENCOUNTER — Other Ambulatory Visit: Payer: Self-pay

## 2019-03-06 DIAGNOSIS — J189 Pneumonia, unspecified organism: Secondary | ICD-10-CM

## 2019-03-06 DIAGNOSIS — J181 Lobar pneumonia, unspecified organism: Secondary | ICD-10-CM | POA: Diagnosis not present

## 2019-03-07 DIAGNOSIS — J181 Lobar pneumonia, unspecified organism: Secondary | ICD-10-CM | POA: Diagnosis not present

## 2019-04-07 DIAGNOSIS — J181 Lobar pneumonia, unspecified organism: Secondary | ICD-10-CM | POA: Diagnosis not present

## 2019-04-13 DIAGNOSIS — I10 Essential (primary) hypertension: Secondary | ICD-10-CM | POA: Diagnosis not present

## 2019-04-13 DIAGNOSIS — I480 Paroxysmal atrial fibrillation: Secondary | ICD-10-CM | POA: Diagnosis not present

## 2019-04-13 DIAGNOSIS — M85852 Other specified disorders of bone density and structure, left thigh: Secondary | ICD-10-CM | POA: Diagnosis not present

## 2019-04-13 DIAGNOSIS — E7800 Pure hypercholesterolemia, unspecified: Secondary | ICD-10-CM | POA: Diagnosis not present

## 2019-05-08 DIAGNOSIS — J181 Lobar pneumonia, unspecified organism: Secondary | ICD-10-CM | POA: Diagnosis not present

## 2019-05-18 NOTE — Progress Notes (Signed)
Virtual Visit via Video Note changed to phone visit at patient request   This visit type was conducted due to national recommendations for restrictions regarding the COVID-19 Pandemic (e.g. social distancing) in an effort to limit this patient's exposure and mitigate transmission in our community.  Due to her co-morbid illnesses, this patient is at least at moderate risk for complications without adequate follow up.  This format is felt to be most appropriate for this patient at this time.  All issues noted in this document were discussed and addressed.  A limited physical exam was performed with this format.  Please refer to the patient's chart for her consent to telehealth for [facility].   Date:  05/22/2019   ID:  Amanda, Berry [birth year], MRN [id number]  Patient Location:Home Provider Location: Home  PCP:  Shirline Frees, MD  Cardiologist:  Dr Stanford Breed  Evaluation Performed:  Follow-Up Visit  Chief Complaint:  FU atrial fibrillatioin  History of Present Illness:    Patient admitted May 2020 with respiratory failure and was treated for pneumonia/sepsis.  Course was complicated by atrial fibrillation and renal insufficiency.  She was treated with Cardizem and apixaban.  Echocardiogram June 2020 showed normal LV function and mild biatrial enlargement.  There was mild aortic stenosis mean gradient 9 mmHg.  Patient has dyspnea with more vigorous activities but not routine activities.  She denies chest pain, palpitations, syncope or bleeding.  No pedal edema.  The patient does not have symptoms concerning for COVID-19 infection (fever, chills, cough, or new shortness of breath).    Past Medical History:  Diagnosis Date  . Constipation   . Fracture of femur, right, closed ([place]) 2017  . Hypertension   . Overactive bladder   . Symptomatic anemia 03/2016   Past Surgical History:  Procedure Laterality Date  . KNEE ARTHROPLASTY Right    approx 10 years ago  . ORIF  PERIPROSTHETIC FRACTURE Right 04/12/2016   Procedure: OPEN REDUCTION INTERNAL FIXATION (ORIF) RIGHT DISTAL FEMUR  PERIPROSTHETIC FRACTURE;  Surgeon: Paralee Cancel, MD;  Location: [facility];  Service: Orthopedics;  Laterality: Right;  . ROTATOR CUFF REPAIR Right 1990's     Current Meds  Medication Sig  . acetaminophen (TYLENOL) 500 MG tablet Take 500 mg by mouth every 6 (six) hours as needed for mild pain.  Marland Kitchen alendronate (FOSAMAX) 70 MG tablet Take 70 mg by mouth once a week.  Marland Kitchen apixaban (ELIQUIS) 5 MG TABS tablet Take 1 tablet (5 mg total) by mouth 2 (two) times daily.  . Cholecalciferol (VITAMIN D3 PO) Take by mouth daily.  Marland Kitchen diltiazem (CARDIZEM CD) 120 MG 24 hr capsule Take 1 capsule (120 mg total) by mouth daily.  . famotidine (PEPCID) 20 MG tablet Take 1 tablet (20 mg total) by mouth daily.     Allergies:   Patient has no known allergies.   Social History   Tobacco Use  . Smoking status: Never Smoker  . Smokeless tobacco: Never Used  Substance Use Topics  . Alcohol use: No  . Drug use: No     Family Hx: The patient's family history includes Alcoholism in her brother; Colon cancer in her father; Hypertension in her mother; Kidney disease in her brother; Lung cancer in her sister; Valvular heart disease in her sister.  ROS:   Please see the history of present illness.    No Fever, chills  or productive cough All other systems reviewed and are negative.   Recent Labs: 01/31/2019: ALT 82;  BUN 22; Creatinine, Ser 1.25; Hemoglobin 10.5; Platelets 364; Potassium 3.6; Sodium 135   Wt Readings from Last 3 Encounters:  05/22/19 158 lb (71.7 kg)  02/27/19 155 lb (70.3 kg)  01/25/19 160 lb (72.6 kg)     Objective:    Vital Signs:  BP 131/63   Pulse 73   Ht 5' (1.524 m)   Wt 158 lb (71.7 kg)   BMI 30.86 kg/m    VITAL SIGNS:  reviewed NAD Answers questions appropriately Normal affect Remainder of physical examination not performed (telehealth visit; coronavirus pandemic)   ASSESSMENT & PLAN:    1. Paroxysmal atrial fibrillation-based on history patient has not had recurrences though she was asymptomatic at time of arrhythmia during previous hospitalization.  Continue Cardizem for rate control if atrial fibrillation recurs.  Continue apixaban. 2. Hypertension-blood pressure is controlled.  Continue present medications and follow. 3. Mild aortic stenosis-patient will require follow-up echoes in the future.  COVID-19 Education: The importance of social distancing was discussed today.  Time:   Today, I have spent 18 minutes with the patient with telehealth technology discussing the above problems.     Medication Adjustments/Labs and Tests Ordered: Current medicines are reviewed at length with the patient today.  Concerns regarding medicines are outlined above.   Tests Ordered: No orders of the defined types were placed in this encounter.   Medication Changes: No orders of the defined types were placed in this encounter.   Follow Up:  Virtual Visit or In Person in 6 month(s)  Signed, Kirk Ruths, MD  05/22/2019 9:02 AM    [place]

## 2019-05-22 ENCOUNTER — Telehealth: Payer: Self-pay | Admitting: *Deleted

## 2019-05-22 ENCOUNTER — Telehealth (INDEPENDENT_AMBULATORY_CARE_PROVIDER_SITE_OTHER): Payer: PPO | Admitting: Cardiology

## 2019-05-22 ENCOUNTER — Encounter: Payer: Self-pay | Admitting: Cardiology

## 2019-05-22 VITALS — BP 131/63 | HR 73 | Ht 60.0 in | Wt 158.0 lb

## 2019-05-22 DIAGNOSIS — I480 Paroxysmal atrial fibrillation: Secondary | ICD-10-CM

## 2019-05-22 DIAGNOSIS — I350 Nonrheumatic aortic (valve) stenosis: Secondary | ICD-10-CM

## 2019-05-22 DIAGNOSIS — I10 Essential (primary) hypertension: Secondary | ICD-10-CM | POA: Diagnosis not present

## 2019-05-22 NOTE — Telephone Encounter (Signed)
She forgot to ask at her virtual visit this morning if the oxygen can be removed from her home. Will forward for dr Stanford Breed review

## 2019-05-22 NOTE — Patient Instructions (Signed)

## 2019-05-23 NOTE — Telephone Encounter (Signed)
Ok to remove from my standpoint if O2 sat >90. Amanda Berry

## 2019-05-23 NOTE — Telephone Encounter (Signed)
Left message for pt to call, need name of company supplying oxygen to send order to cancel and remove.

## 2019-05-24 ENCOUNTER — Encounter: Payer: Self-pay | Admitting: *Deleted

## 2019-05-24 NOTE — Telephone Encounter (Signed)
Spoke with pt, [employer] is the supplier of her oxygen. Will fax an order to them at [phone] to d/c oxygen and remove from the home.

## 2019-06-07 DIAGNOSIS — J181 Lobar pneumonia, unspecified organism: Secondary | ICD-10-CM | POA: Diagnosis not present

## 2019-09-13 DIAGNOSIS — M2041 Other hammer toe(s) (acquired), right foot: Secondary | ICD-10-CM | POA: Diagnosis not present

## 2019-09-13 DIAGNOSIS — M79671 Pain in right foot: Secondary | ICD-10-CM | POA: Diagnosis not present

## 2019-09-13 DIAGNOSIS — L84 Corns and callosities: Secondary | ICD-10-CM | POA: Diagnosis not present

## 2019-09-13 DIAGNOSIS — M21619 Bunion of unspecified foot: Secondary | ICD-10-CM | POA: Diagnosis not present

## 2019-09-13 DIAGNOSIS — M79672 Pain in left foot: Secondary | ICD-10-CM | POA: Diagnosis not present

## 2019-10-03 DIAGNOSIS — I10 Essential (primary) hypertension: Secondary | ICD-10-CM | POA: Diagnosis not present

## 2019-10-03 DIAGNOSIS — I480 Paroxysmal atrial fibrillation: Secondary | ICD-10-CM | POA: Diagnosis not present

## 2019-10-03 DIAGNOSIS — M810 Age-related osteoporosis without current pathological fracture: Secondary | ICD-10-CM | POA: Diagnosis not present

## 2019-10-03 DIAGNOSIS — E7800 Pure hypercholesterolemia, unspecified: Secondary | ICD-10-CM | POA: Diagnosis not present

## 2019-10-04 ENCOUNTER — Other Ambulatory Visit: Payer: Self-pay | Admitting: Family Medicine

## 2019-10-04 DIAGNOSIS — M810 Age-related osteoporosis without current pathological fracture: Secondary | ICD-10-CM

## 2019-10-12 DIAGNOSIS — M21611 Bunion of right foot: Secondary | ICD-10-CM | POA: Diagnosis not present

## 2019-10-12 DIAGNOSIS — M21619 Bunion of unspecified foot: Secondary | ICD-10-CM | POA: Diagnosis not present

## 2019-10-12 DIAGNOSIS — M2041 Other hammer toe(s) (acquired), right foot: Secondary | ICD-10-CM | POA: Diagnosis not present

## 2019-10-28 ENCOUNTER — Ambulatory Visit: Payer: PPO | Attending: Internal Medicine

## 2019-10-28 DIAGNOSIS — Z23 Encounter for immunization: Secondary | ICD-10-CM | POA: Insufficient documentation

## 2019-10-28 NOTE — Progress Notes (Signed)
   Covid-19 Vaccination Clinic  Name:  Amanda Berry    MRN: [id number] DOB: [birth year]  10/28/2019  [name] was observed post Covid-19 immunization for 15 minutes without incidence. She was provided with Vaccine Information Sheet and instruction to access the V-Safe system.   [name] was instructed to call 911 with any severe reactions post vaccine: Marland Kitchen Difficulty breathing  . Swelling of your face and throat  . A fast heartbeat  . A bad rash all over your body  . Dizziness and weakness    Immunizations Administered    Name Date Dose VIS Date Route   Pfizer COVID-19 Vaccine 10/28/2019  1:54 PM 0.3 mL 08/10/2019 Intramuscular   Manufacturer: [place]   Lot: [id number]   [place]: [id number]

## 2019-11-21 ENCOUNTER — Ambulatory Visit: Payer: PPO | Attending: Internal Medicine

## 2019-11-21 DIAGNOSIS — Z23 Encounter for immunization: Secondary | ICD-10-CM

## 2019-11-21 NOTE — Progress Notes (Signed)
   Covid-19 Vaccination Clinic  Name:  Amanda Berry    MRN: [id number] DOB: 08/27/1934  11/21/2019  Amanda Berry was observed post Covid-19 immunization for 15 minutes without incident. She was provided with Vaccine Information Sheet and instruction to access the V-Safe system.   Amanda Berry was instructed to call 911 with any severe reactions post vaccine: Marland Kitchen Difficulty breathing  . Swelling of face and throat  . A fast heartbeat  . A bad rash all over body  . Dizziness and weakness   Immunizations Administered    Name Date Dose VIS Date Route   Pfizer COVID-19 Vaccine 11/21/2019  2:02 PM 0.3 mL 08/10/2019 Intramuscular   Manufacturer: [place]   Lot: [id number]   [place]: [id number]

## 2019-12-14 NOTE — Progress Notes (Signed)
HPI: FU atrial fibrillation. Patient admitted May 2020 with respiratory failure and was treated for pneumonia/sepsis.  Course was complicated by atrial fibrillation and renal insufficiency.  She was treated with Cardizem and apixaban.  Echocardiogram June 2020 showed normal LV function and mild biatrial enlargement.  There was mild aortic stenosis mean gradient 9 mmHg.  Since last seen, he has occasional dyspnea on exertion but no orthopnea, PND or pedal edema.  No chest pain, syncope or bleeding.  Current Outpatient Medications  Medication Sig Dispense Refill  . acetaminophen (TYLENOL) 500 MG tablet Take 500 mg by mouth every 6 (six) hours as needed for mild pain.    Marland Kitchen alendronate (FOSAMAX) 70 MG tablet Take 70 mg by mouth once a week.    Marland Kitchen apixaban (ELIQUIS) 5 MG TABS tablet Take 1 tablet (5 mg total) by mouth 2 (two) times daily. 60 tablet 1  . Cholecalciferol (VITAMIN D3 PO) Take by mouth daily.    Marland Kitchen diltiazem (CARDIZEM CD) 120 MG 24 hr capsule Take 1 capsule (120 mg total) by mouth daily. 30 capsule 1  . famotidine (PEPCID) 20 MG tablet Take 1 tablet (20 mg total) by mouth daily.     No current facility-administered medications for this visit.     Past Medical History:  Diagnosis Date  . Constipation   . Fracture of femur, right, closed ([place]) 2017  . Hypertension   . Overactive bladder   . Symptomatic anemia 03/2016    Past Surgical History:  Procedure Laterality Date  . KNEE ARTHROPLASTY Right    approx 10 years ago  . ORIF PERIPROSTHETIC FRACTURE Right 04/12/2016   Procedure: OPEN REDUCTION INTERNAL FIXATION (ORIF) RIGHT DISTAL FEMUR  PERIPROSTHETIC FRACTURE;  Surgeon: Paralee Cancel, MD;  Location: [facility];  Service: Orthopedics;  Laterality: Right;  . ROTATOR CUFF REPAIR Right 1990's    Social History   Socioeconomic History  . Marital status: Married    Spouse name: Not on file  . Number of children: 3  . Years of education: Not on file  . Highest education  level: Not on file  Occupational History  . Not on file  Tobacco Use  . Smoking status: Never Smoker  . Smokeless tobacco: Never Used  Substance and Sexual Activity  . Alcohol use: No  . Drug use: No  . Sexual activity: Not on file  Other Topics Concern  . Not on file  Social History Narrative  . Not on file   Social Determinants of Health   Financial Resource Strain:   . Difficulty of Paying Living Expenses:   Food Insecurity:   . Worried About Charity fundraiser in the Last Year:   . Arboriculturist in the Last Year:   Transportation Needs:   . Film/video editor (Medical):   Marland Kitchen Lack of Transportation (Non-Medical):   Physical Activity:   . Days of Exercise per Week:   . Minutes of Exercise per Session:   Stress:   . Feeling of Stress :   Social Connections:   . Frequency of Communication with Friends and Family:   . Frequency of Social Gatherings with Friends and Family:   . Attends Religious Services:   . Active Member of Clubs or Organizations:   . Attends Archivist Meetings:   Marland Kitchen Marital Status:   Intimate Partner Violence:   . Fear of Current or Ex-Partner:   . Emotionally Abused:   Marland Kitchen Physically Abused:   .  Sexually Abused:     Family History  Problem Relation Age of Onset  . Hypertension Mother   . Colon cancer Father   . Valvular heart disease Sister   . Kidney disease Brother   . Alcoholism Brother   . Lung cancer Sister     ROS: no fevers or chills, productive cough, hemoptysis, dysphasia, odynophagia, melena, hematochezia, dysuria, hematuria, rash, seizure activity, orthopnea, PND, pedal edema, claudication. Remaining systems are negative.  Physical Exam: Well-developed well-nourished in no acute distress.  Skin is warm and dry.  HEENT is normal.  Neck is supple.  Chest is clear to auscultation with normal expansion.  Cardiovascular exam is regular rate and rhythm.  Abdominal exam nontender or distended. No masses  palpated. Extremities show no edema. neuro grossly intact  ECG-normal sinus rhythm, nonspecific ST changes.  Personally reviewed  A/P  1 paroxysmal atrial fibrillation patient remains in sinus rhythm today.  Continue Cardizem at present dose.  Continue apixaban.  We will obtain most recent hemoglobin and creatinine from primary care.  May need to adjust dose of apixaban.  2 history of mild aortic stenosis-patient will need follow-up echoes in the future.  3 hypertension-patient's blood pressure is elevated; increase Cardizem to 240 mg daily and follow.  Kirk Ruths, MD

## 2019-12-18 ENCOUNTER — Other Ambulatory Visit: Payer: Self-pay

## 2019-12-18 ENCOUNTER — Encounter: Payer: Self-pay | Admitting: Cardiology

## 2019-12-18 ENCOUNTER — Ambulatory Visit: Payer: PPO | Admitting: Cardiology

## 2019-12-18 VITALS — BP 180/70 | HR 70 | Ht 60.0 in | Wt 165.4 lb

## 2019-12-18 DIAGNOSIS — I480 Paroxysmal atrial fibrillation: Secondary | ICD-10-CM | POA: Diagnosis not present

## 2019-12-18 DIAGNOSIS — I10 Essential (primary) hypertension: Secondary | ICD-10-CM

## 2019-12-18 DIAGNOSIS — I350 Nonrheumatic aortic (valve) stenosis: Secondary | ICD-10-CM | POA: Diagnosis not present

## 2019-12-18 MED ORDER — DILTIAZEM HCL ER COATED BEADS 240 MG PO CP24: 240.0000 mg | ORAL_CAPSULE | Freq: Every day | ORAL | 3 refills | Status: DC

## 2019-12-18 NOTE — Patient Instructions (Signed)
Medication Instructions:  INCREASE DILTIAZEM TO 240 MG ONCE DAILY= 2 OF THE 120 MG TABLETS ONCE DAILY  *If you need a refill on your cardiac medications before your next appointment, please call your pharmacy*   Lab Work: If you have labs (blood work) drawn today and your tests are completely normal, you will receive your results only by: Marland Kitchen MyChart Message (if you have MyChart) OR . A paper copy in the mail If you have any lab test that is abnormal or we need to change your treatment, we will call you to review the results.   Follow-Up: At [facility], you and your health needs are our priority.  As part of our continuing mission to provide you with exceptional heart care, we have created designated Provider Care Teams.  These Care Teams include your primary Cardiologist (physician) and Advanced Practice Providers (APPs -  Physician Assistants and Nurse Practitioners) who all work together to provide you with the care you need, when you need it.  We recommend signing up for the patient portal called "MyChart".  Sign up information is provided on this After Visit Summary.  MyChart is used to connect with patients for Virtual Visits (Telemedicine).  Patients are able to view lab/test results, encounter notes, upcoming appointments, etc.  Non-urgent messages can be sent to your provider as well.   To learn more about what you can do with MyChart, go to [email or web address].    Your next appointment:   12 month(s)  The format for your next appointment:   Either In Person or Virtual  Provider:   You may see Kirk Ruths MD or one of the following Advanced Practice Providers on your designated Care Team:    Kerin Ransom, PA-C  [place], [place]  Coletta Memos, [place]

## 2019-12-24 ENCOUNTER — Ambulatory Visit
Admission: RE | Admit: 2019-12-24 | Discharge: 2019-12-24 | Disposition: A | Discharge status: Home / Self Care | Payer: PPO | Source: Ambulatory Visit | Attending: Family Medicine | Admitting: Family Medicine

## 2019-12-24 ENCOUNTER — Other Ambulatory Visit: Payer: Self-pay

## 2019-12-24 DIAGNOSIS — M85852 Other specified disorders of bone density and structure, left thigh: Secondary | ICD-10-CM | POA: Diagnosis not present

## 2019-12-24 DIAGNOSIS — M810 Age-related osteoporosis without current pathological fracture: Secondary | ICD-10-CM

## 2019-12-24 DIAGNOSIS — Z780 Asymptomatic menopausal state: Secondary | ICD-10-CM | POA: Diagnosis not present

## 2020-01-01 ENCOUNTER — Other Ambulatory Visit (INDEPENDENT_AMBULATORY_CARE_PROVIDER_SITE_OTHER): Payer: PPO

## 2020-01-01 DIAGNOSIS — I10 Essential (primary) hypertension: Secondary | ICD-10-CM | POA: Diagnosis not present

## 2020-01-08 DIAGNOSIS — I10 Essential (primary) hypertension: Secondary | ICD-10-CM | POA: Diagnosis not present

## 2020-01-08 DIAGNOSIS — E7800 Pure hypercholesterolemia, unspecified: Secondary | ICD-10-CM | POA: Diagnosis not present

## 2020-01-08 DIAGNOSIS — I480 Paroxysmal atrial fibrillation: Secondary | ICD-10-CM | POA: Diagnosis not present

## 2020-01-08 DIAGNOSIS — M159 Polyosteoarthritis, unspecified: Secondary | ICD-10-CM | POA: Diagnosis not present

## 2020-01-08 DIAGNOSIS — M810 Age-related osteoporosis without current pathological fracture: Secondary | ICD-10-CM | POA: Diagnosis not present

## 2020-04-07 DIAGNOSIS — Z0000 Encounter for general adult medical examination without abnormal findings: Secondary | ICD-10-CM | POA: Diagnosis not present

## 2020-04-07 DIAGNOSIS — M810 Age-related osteoporosis without current pathological fracture: Secondary | ICD-10-CM | POA: Diagnosis not present

## 2020-04-07 DIAGNOSIS — I10 Essential (primary) hypertension: Secondary | ICD-10-CM | POA: Diagnosis not present

## 2020-04-07 DIAGNOSIS — E7800 Pure hypercholesterolemia, unspecified: Secondary | ICD-10-CM | POA: Diagnosis not present

## 2020-04-07 DIAGNOSIS — I480 Paroxysmal atrial fibrillation: Secondary | ICD-10-CM | POA: Diagnosis not present

## 2020-04-09 DIAGNOSIS — E7800 Pure hypercholesterolemia, unspecified: Secondary | ICD-10-CM | POA: Diagnosis not present

## 2020-04-09 DIAGNOSIS — I480 Paroxysmal atrial fibrillation: Secondary | ICD-10-CM | POA: Diagnosis not present

## 2020-04-09 DIAGNOSIS — M810 Age-related osteoporosis without current pathological fracture: Secondary | ICD-10-CM | POA: Diagnosis not present

## 2020-04-09 DIAGNOSIS — M159 Polyosteoarthritis, unspecified: Secondary | ICD-10-CM | POA: Diagnosis not present

## 2020-04-09 DIAGNOSIS — I10 Essential (primary) hypertension: Secondary | ICD-10-CM | POA: Diagnosis not present

## 2020-05-08 ENCOUNTER — Telehealth: Payer: Self-pay | Admitting: Cardiology

## 2020-05-08 ENCOUNTER — Other Ambulatory Visit: Payer: Self-pay

## 2020-05-08 NOTE — Telephone Encounter (Signed)
*  STAT* If patient is at the pharmacy, call can be transferred to refill team.   1. Which medications need to be refilled? (please list name of each medication and dose if known) apixaban (ELIQUIS) 5 MG TABS tablet  2. Which pharmacy/location (including street and city if local pharmacy) is medication to be sent to? [place], [address]  3. Do they need a 30 day or 90 day supply? 90 day Patient will be out by tomorrow.

## 2020-05-26 ENCOUNTER — Emergency Department (HOSPITAL_COMMUNITY): Payer: PPO

## 2020-05-26 ENCOUNTER — Other Ambulatory Visit: Payer: Self-pay

## 2020-05-26 ENCOUNTER — Emergency Department (HOSPITAL_COMMUNITY)
Admission: EM | Admit: 2020-05-26 | Discharge: 2020-05-26 | Disposition: A | Discharge status: Home / Self Care | Payer: PPO | Attending: Emergency Medicine | Admitting: Emergency Medicine

## 2020-05-26 ENCOUNTER — Encounter (HOSPITAL_COMMUNITY): Payer: Self-pay

## 2020-05-26 DIAGNOSIS — Z96651 Presence of right artificial knee joint: Secondary | ICD-10-CM | POA: Diagnosis not present

## 2020-05-26 DIAGNOSIS — S61012A Laceration without foreign body of left thumb without damage to nail, initial encounter: Secondary | ICD-10-CM | POA: Diagnosis not present

## 2020-05-26 DIAGNOSIS — Z7901 Long term (current) use of anticoagulants: Secondary | ICD-10-CM | POA: Insufficient documentation

## 2020-05-26 DIAGNOSIS — R519 Headache, unspecified: Secondary | ICD-10-CM | POA: Diagnosis not present

## 2020-05-26 DIAGNOSIS — I10 Essential (primary) hypertension: Secondary | ICD-10-CM | POA: Insufficient documentation

## 2020-05-26 DIAGNOSIS — Z043 Encounter for examination and observation following other accident: Secondary | ICD-10-CM | POA: Diagnosis not present

## 2020-05-26 DIAGNOSIS — Y92481 Parking lot as the place of occurrence of the external cause: Secondary | ICD-10-CM | POA: Diagnosis not present

## 2020-05-26 DIAGNOSIS — S61412A Laceration without foreign body of left hand, initial encounter: Secondary | ICD-10-CM | POA: Diagnosis not present

## 2020-05-26 DIAGNOSIS — W01198A Fall on same level from slipping, tripping and stumbling with subsequent striking against other object, initial encounter: Secondary | ICD-10-CM | POA: Diagnosis not present

## 2020-05-26 DIAGNOSIS — S61011A Laceration without foreign body of right thumb without damage to nail, initial encounter: Secondary | ICD-10-CM | POA: Diagnosis not present

## 2020-05-26 DIAGNOSIS — M25461 Effusion, right knee: Secondary | ICD-10-CM | POA: Diagnosis not present

## 2020-05-26 DIAGNOSIS — M7989 Other specified soft tissue disorders: Secondary | ICD-10-CM | POA: Diagnosis not present

## 2020-05-26 DIAGNOSIS — S0003XA Contusion of scalp, initial encounter: Secondary | ICD-10-CM | POA: Diagnosis not present

## 2020-05-26 DIAGNOSIS — S0083XA Contusion of other part of head, initial encounter: Secondary | ICD-10-CM | POA: Diagnosis not present

## 2020-05-26 DIAGNOSIS — W19XXXA Unspecified fall, initial encounter: Secondary | ICD-10-CM

## 2020-05-26 MED ORDER — LIDOCAINE HCL (PF) 1 % IJ SOLN
5.0000 mL | Freq: Once | INTRAMUSCULAR | Status: AC
  Administered 2020-05-26: 5 mL via INTRADERMAL
  Filled 2020-05-26: qty 5

## 2020-05-26 NOTE — ED Notes (Signed)
Attempted to call CT x 2

## 2020-05-26 NOTE — ED Provider Notes (Signed)
[facility] EMERGENCY DEPARTMENT Provider Note   CSN: [id number] Arrival date & time: 05/26/20  1743     History Chief Complaint  Patient presents with  . Level 2 Fall on Thinners    Amanda Berry is a 86 y.o. female with HTN, A Fib on eliquis, who presents as a Level 2 trauma activation after a ground level fall.  [place] [facility] [place] she tripped over one of the parking blocks in the parking lot.  She landed on her face.  No LOC.  She was driven home by her daughter, and then went to clinic for her injuries.   Fall This is a new problem. The problem has not changed since onset.Associated symptoms include headaches. Pertinent negatives include no chest pain, no abdominal pain and no shortness of breath. Nothing aggravates the symptoms. Nothing relieves the symptoms. She has tried nothing for the symptoms.       Past Medical History:  Diagnosis Date  . Constipation   . Fracture of femur, right, closed ([place]) 2017  . Hypertension   . Overactive bladder   . Symptomatic anemia 03/2016    Patient Active Problem List   Diagnosis Date Noted  . CAP (community acquired pneumonia) 02/27/2019  . Anticoagulated 02/27/2019  . LFT elevation   . Lobar pneumonia ([place])   . Acute respiratory failure with hypoxia ([place])   . AKI (acute kidney injury) ([place])   . Suspected COVID-19 virus infection   . Hyponatremia   . PAF (paroxysmal atrial fibrillation) ([place])   . Other specified sepsis ([place]) 01/25/2019  . Symptomatic anemia 04/19/2016  . Hypertension 04/19/2016  . Constipation 04/19/2016  . Overactive bladder 04/19/2016  . Femur fracture, right, closed, initial encounter 04/11/2016    Past Surgical History:  Procedure Laterality Date  . KNEE ARTHROPLASTY Right    approx 10 years ago  . ORIF PERIPROSTHETIC FRACTURE Right 04/12/2016   Procedure: OPEN REDUCTION INTERNAL FIXATION (ORIF) RIGHT DISTAL FEMUR  PERIPROSTHETIC FRACTURE;  Surgeon: Paralee Cancel,  MD;  Location: [facility];  Service: Orthopedics;  Laterality: Right;  . ROTATOR CUFF REPAIR Right 1990's     OB History   No obstetric history on file.     Family History  Problem Relation Age of Onset  . Hypertension Mother   . Colon cancer Father   . Valvular heart disease Sister   . Kidney disease Brother   . Alcoholism Brother   . Lung cancer Sister     Social History   Tobacco Use  . Smoking status: Never Smoker  . Smokeless tobacco: Never Used  Substance Use Topics  . Alcohol use: No  . Drug use: No    Home Medications Prior to Admission medications   Medication Sig Start Date End Date Taking? Authorizing Provider  acetaminophen (TYLENOL) 500 MG tablet Take 500 mg by mouth every 6 (six) hours as needed for mild pain.    [provider]  alendronate (FOSAMAX) 70 MG tablet Take 70 mg by mouth once a week. 01/05/19   [provider]  apixaban (ELIQUIS) 5 MG TABS tablet Take 1 tablet (5 mg total) by mouth 2 (two) times daily. 01/31/19   Georgette Shell, MD  Cholecalciferol (VITAMIN D3 PO) Take by mouth daily.    [provider]  diltiazem (CARDIZEM CD) 240 MG 24 hr capsule Take 1 capsule (240 mg total) by mouth daily. 12/18/19   Lelon Perla, MD  famotidine (PEPCID) 20 MG  tablet Take 1 tablet (20 mg total) by mouth daily. 02/01/19   Georgette Shell, MD    Allergies    Patient has no known allergies.  Review of Systems   Review of Systems  Constitutional: Negative for chills and fever.  HENT: Negative for ear pain and sore throat.   Eyes: Negative for pain and visual disturbance.  Respiratory: Negative for cough and shortness of breath.   Cardiovascular: Negative for chest pain and palpitations.  Gastrointestinal: Negative for abdominal pain and vomiting.  Genitourinary: Negative for dysuria and hematuria.  Musculoskeletal: Negative for arthralgias, back pain and neck pain.  Skin: Negative for color change and rash.  Neurological:  Positive for headaches. Negative for seizures and syncope.  All other systems reviewed and are negative.   Physical Exam Updated Vital Signs There were no vitals taken for this visit.  Physical Exam Vitals and nursing note reviewed.  Constitutional:      General: She is not in acute distress.    Appearance: She is well-developed.  HENT:     Nose: Nose normal.     Mouth/Throat:     Mouth: Mucous membranes are moist.     Pharynx: Oropharynx is clear.  Eyes:     Extraocular Movements: Extraocular movements intact.     Conjunctiva/sclera: Conjunctivae normal.     Pupils: Pupils are equal, round, and reactive to light.     Comments: Left periorbital ecchymosis  Neck:     Comments: No C-spine tenderness. Cardiovascular:     Rate and Rhythm: Normal rate and regular rhythm.     Heart sounds: No murmur heard.   Pulmonary:     Effort: Pulmonary effort is normal. No respiratory distress.     Breath sounds: Normal breath sounds.  Abdominal:     Palpations: Abdomen is soft.     Tenderness: There is no abdominal tenderness.  Musculoskeletal:        General: No deformity.     Cervical back: Normal range of motion and neck supple.     Comments: No T or L-spine tenderness.  Skin:    General: Skin is warm and dry.     Comments: Bilateral ecchymoses to superior to knees.  Patient's bilateral skin tears to the bases of her thumbs as well as extensive skin tear to the dorsal surface of her left hand.  Neurological:     General: No focal deficit present.     Mental Status: She is alert and oriented to person, place, and time.     Gait: Gait normal.     ED Results / Procedures / Treatments   Labs (all labs ordered are listed, but only abnormal results are displayed) Labs Reviewed - No data to display  EKG None  Radiology DG Knee 1-2 Views Left  Result Date: 05/26/2020 CLINICAL DATA:  Status post fall. EXAM: LEFT KNEE - 1-2 VIEW COMPARISON:  None. FINDINGS: No evidence of an acute  fracture or dislocation. There is marked severity narrowing of the medial and lateral tibiofemoral compartment spaces. Marked severity patellofemoral narrowing is also seen. A small joint effusion is noted. IMPRESSION: 1. No evidence of an acute fracture or dislocation. 2. Severe tricompartmental degenerative changes with a small joint effusion. Electronically Signed   By: Virgina Norfolk M.D.   On: 05/26/2020 19:39   CT Head Wo Contrast  Result Date: 05/26/2020 CLINICAL DATA:  Fall.  On blood thinner.  Left facial injury. EXAM: CT HEAD WITHOUT CONTRAST CT MAXILLOFACIAL WITHOUT CONTRAST  TECHNIQUE: Multidetector CT imaging of the head and maxillofacial structures were performed using the standard protocol without intravenous contrast. Multiplanar CT image reconstructions of the maxillofacial structures were also generated. COMPARISON:  None. FINDINGS: CT HEAD FINDINGS Brain: No evidence of acute infarction, hemorrhage, hydrocephalus, extra-axial collection or mass lesion/mass effect. Mild hypodensity in the cerebral white matter compatible with chronic microvascular ischemia. Vascular: Negative for hyperdense vessel Skull: Negative for skull fracture. Left supraorbital scalp hematoma. Other: None CT MAXILLOFACIAL FINDINGS Osseous: Negative for facial fracture. No fracture of the orbit or mandible. Severe cervical spondylosis.  No fracture identified. Orbits: Right cataract extraction.  No orbital mass or edema. Sinuses: Mild mucosal edema maxillary sinus bilaterally. No air-fluid level. Soft tissues: Right supraorbital scalp hematoma extending to the upper eyelid. No extension into the orbit. IMPRESSION: 1. No acute intracranial abnormality 2. Negative for facial fracture 3. Left supraorbital scalp hematoma. Electronically Signed   By: Franchot Gallo M.D.   On: 05/26/2020 19:11   DG Hand 2 View Right  Result Date: 05/26/2020 CLINICAL DATA:  Status post fall. EXAM: RIGHT HAND - 2 VIEW COMPARISON:  None.  FINDINGS: There is no evidence of fracture or dislocation. Mild to moderate severity degenerative changes seen throughout the right wrist and interphalangeal joints of the right hand. Moderate to marked severity degenerative changes are also noted along the carpometacarpal articulation of the right thumb. Soft tissues are unremarkable. IMPRESSION: 1. No acute findings. 2. Mild to moderate severity degenerative changes as described above. Electronically Signed   By: Virgina Norfolk M.D.   On: 05/26/2020 19:35   DG Hand 2 View Left  Result Date: 05/26/2020 CLINICAL DATA:  Status post fall. EXAM: LEFT HAND - 2 VIEW COMPARISON:  None. FINDINGS: There is no evidence of acute fracture or dislocation. Marked severity degenerative changes are seen involving the DIP joints of the second, third, fourth and fifth left fingers. Marked severity degenerative changes are also seen involving the carpometacarpal articulation of the left thumb. Hyperextension of the left thumb is seen. Mild dorsal soft tissue swelling is noted. IMPRESSION: 1. No acute fracture or dislocation. 2. Marked severity degenerative changes. Electronically Signed   By: Virgina Norfolk M.D.   On: 05/26/2020 19:31   CT Maxillofacial Wo Contrast  Result Date: 05/26/2020 CLINICAL DATA:  Fall.  On blood thinner.  Left facial injury. EXAM: CT HEAD WITHOUT CONTRAST CT MAXILLOFACIAL WITHOUT CONTRAST TECHNIQUE: Multidetector CT imaging of the head and maxillofacial structures were performed using the standard protocol without intravenous contrast. Multiplanar CT image reconstructions of the maxillofacial structures were also generated. COMPARISON:  None. FINDINGS: CT HEAD FINDINGS Brain: No evidence of acute infarction, hemorrhage, hydrocephalus, extra-axial collection or mass lesion/mass effect. Mild hypodensity in the cerebral white matter compatible with chronic microvascular ischemia. Vascular: Negative for hyperdense vessel Skull: Negative for skull  fracture. Left supraorbital scalp hematoma. Other: None CT MAXILLOFACIAL FINDINGS Osseous: Negative for facial fracture. No fracture of the orbit or mandible. Severe cervical spondylosis.  No fracture identified. Orbits: Right cataract extraction.  No orbital mass or edema. Sinuses: Mild mucosal edema maxillary sinus bilaterally. No air-fluid level. Soft tissues: Right supraorbital scalp hematoma extending to the upper eyelid. No extension into the orbit. IMPRESSION: 1. No acute intracranial abnormality 2. Negative for facial fracture 3. Left supraorbital scalp hematoma. Electronically Signed   By: Franchot Gallo M.D.   On: 05/26/2020 19:11    Procedures .Marland KitchenLaceration Repair  Date/Time: 05/26/2020 8:55 PM Performed by: Asencion Noble, MD Authorized by: Marda Stalker  J, MD   Laceration details:    Location:  Finger   Finger location:  R thumb   Length (cm):  2 Treatment:    Area cleansed with:  Saline   Amount of cleaning:  Standard Skin repair:    Repair method:  Sutures   Suture size:  3-0   Suture material:  Prolene   Suture technique:  Horizontal mattress and simple interrupted   Number of sutures:  3 Post-procedure details:    Patient tolerance of procedure:  Tolerated well, no immediate complications .Marland KitchenLaceration Repair  Date/Time: 05/26/2020 8:55 PM Performed by: Asencion Noble, MD Authorized by: Courtney Paris, MD   Laceration details:    Location:  Hand   Hand location:  L hand, dorsum   Length (cm):  7 Treatment:    Area cleansed with:  Saline   Amount of cleaning:  Standard Skin repair:    Repair method:  Steri-Strips Post-procedure details:    Dressing:  Splint for protection   (including critical care time)  Medications Ordered in ED Medications - No data to display  ED Course  I have reviewed the triage vital signs and the nursing notes.  Pertinent labs & imaging results that were available during my care of the patient were reviewed by me  and considered in my medical decision making (see chart for details).    MDM Rules/Calculators/A&P                          Fall mechanism is clearly mechanical in nature, no concern for seizure or syncope.  CT head and CT maxillofacial negative for traumatic fracture or intracranial bleed.  Does note left supraorbital scalp hematoma, which is consistent with exam.  Patient ambulatory on arrival.  Areas with overlying ecchymosis or skin tears did not have any underlying fractures on x-ray. Skin tears without evidence of retained foreign body on XR.  Irrigated at bedside and repaired with Steri-Strips per procedure documentation above.  Thumb immobilized with brace to aid with laceration healing.  Discussed aftercare instructions and return precautions with patient, who verbalized understanding agreement.  Encourage pain control with Tylenol and PCP follow-up as needed in addition to in 10 to 14 days for suture removal.  Patient appropriate for discharge at this time.  This patient was seen with Dr. Sherry Ruffing.  Final Clinical Impression(s) / ED Diagnoses Final diagnoses:  Fall, initial encounter  Skin tear of left hand without complication, initial encounter    Rx / DC Orders ED Discharge Orders    None       Asencion Noble, MD 05/26/20 2340    Tegeler, Gwenyth Allegra, MD 05/27/20 1051

## 2020-05-26 NOTE — ED Notes (Signed)
Reviewed discharge instructions with patient and daughter. Follow-up care reviewed. Patient and daughter verbalized understanding. Patient A&Ox4, VSS upon discharge. 

## 2020-05-26 NOTE — Discharge Instructions (Signed)
Please return to a doctor to have sutures removed in 10-14 days. You may take tylenol at home for pain and allow soapy water to run over sutures, but do not scrub. You can wear the thumb brace and wrap for comfort. Please watch for signs of infection.

## 2020-05-26 NOTE — Progress Notes (Signed)
Orthopedic Tech Progress Note Patient Details:  Amanda Berry 30-May-1934 [id number]  Ortho Devices Type of Ortho Device: Thumb velcro splint Ortho Device/Splint Location: rue Ortho Device/Splint Interventions: Ordered, Application, Adjustment   Post Interventions Patient Tolerated: Well Instructions Provided: Care of device, Adjustment of device   Karolee Stamps 05/26/2020, 10:33 PM

## 2020-05-26 NOTE — Progress Notes (Signed)
   05/26/20 1800  Clinical Encounter Type  Visited With Patient not available  Visit Type Trauma  Referral From Nurse  Consult/Referral To Chaplain  Chaplain responded to trauma 2 page. Patient was receiving an xray. No family present. Spoke with Network engineer and she will call chaplain if family shows up. Chaplain will follow up as needed.

## 2020-05-26 NOTE — Progress Notes (Signed)
Orthopedic Tech Progress Note Patient Details:  Amanda Berry [birth year] [id number] Level 2 Trauma Patient ID: [name], female   DOB: 07-Jun-1934, 86 y.o.   MRN: [id number]   Tammy Sours 05/26/2020, 6:42 PM

## 2020-06-05 DIAGNOSIS — S61011D Laceration without foreign body of right thumb without damage to nail, subsequent encounter: Secondary | ICD-10-CM | POA: Diagnosis not present

## 2020-06-05 DIAGNOSIS — Z23 Encounter for immunization: Secondary | ICD-10-CM | POA: Diagnosis not present

## 2020-06-30 DIAGNOSIS — I480 Paroxysmal atrial fibrillation: Secondary | ICD-10-CM | POA: Diagnosis not present

## 2020-06-30 DIAGNOSIS — M159 Polyosteoarthritis, unspecified: Secondary | ICD-10-CM | POA: Diagnosis not present

## 2020-06-30 DIAGNOSIS — I10 Essential (primary) hypertension: Secondary | ICD-10-CM | POA: Diagnosis not present

## 2020-06-30 DIAGNOSIS — E7800 Pure hypercholesterolemia, unspecified: Secondary | ICD-10-CM | POA: Diagnosis not present

## 2020-06-30 DIAGNOSIS — K219 Gastro-esophageal reflux disease without esophagitis: Secondary | ICD-10-CM | POA: Diagnosis not present

## 2020-06-30 DIAGNOSIS — M810 Age-related osteoporosis without current pathological fracture: Secondary | ICD-10-CM | POA: Diagnosis not present

## 2020-10-08 DIAGNOSIS — E7800 Pure hypercholesterolemia, unspecified: Secondary | ICD-10-CM | POA: Diagnosis not present

## 2020-10-08 DIAGNOSIS — I10 Essential (primary) hypertension: Secondary | ICD-10-CM | POA: Diagnosis not present

## 2020-10-08 DIAGNOSIS — M810 Age-related osteoporosis without current pathological fracture: Secondary | ICD-10-CM | POA: Diagnosis not present

## 2020-10-08 DIAGNOSIS — R946 Abnormal results of thyroid function studies: Secondary | ICD-10-CM | POA: Diagnosis not present

## 2020-10-08 DIAGNOSIS — Z0000 Encounter for general adult medical examination without abnormal findings: Secondary | ICD-10-CM | POA: Diagnosis not present

## 2020-10-08 DIAGNOSIS — I480 Paroxysmal atrial fibrillation: Secondary | ICD-10-CM | POA: Diagnosis not present

## 2020-12-01 IMAGING — CT CT MAXILLOFACIAL W/O CM
3 of 8 series · 14 of 47 positions shown, 17 images · non-contrast
Comparison: None.

CLINICAL DATA: Fall.  On blood thinner.  Left facial injury.

EXAM:
CT HEAD WITHOUT CONTRAST
CT MAXILLOFACIAL WITHOUT CONTRAST
TECHNIQUE: Multidetector CT imaging of the head and maxillofacial structures
were performed using the standard protocol without intravenous
contrast. Multiplanar CT image reconstructions of the maxillofacial
structures were also generated.

[Series 4: st thins · axial · 0.37mm/px · z∈[-150,-31]mm · 9 of 210 slices shown, 12 images]
[im 21/210  brain]
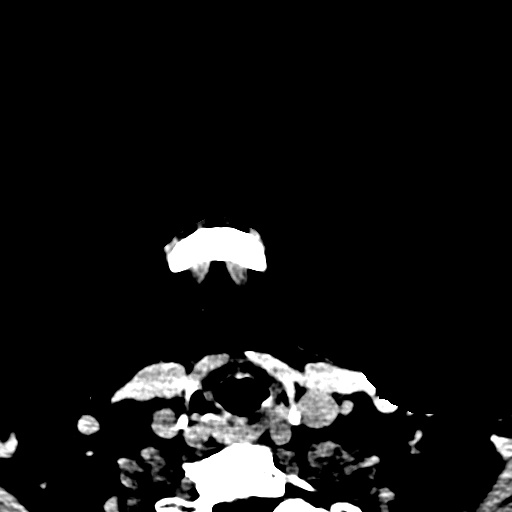
[im 21/210  bone]
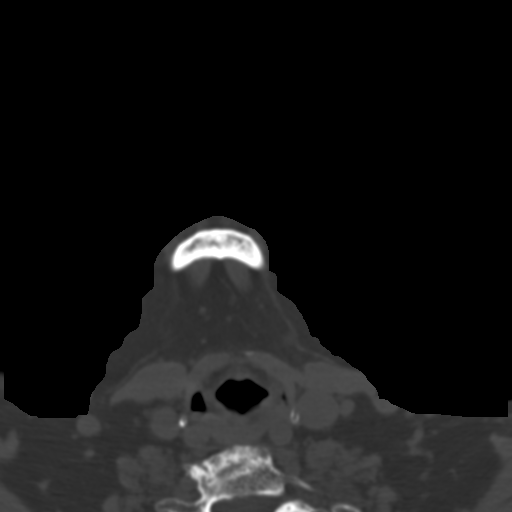
[im 42/210  bone]
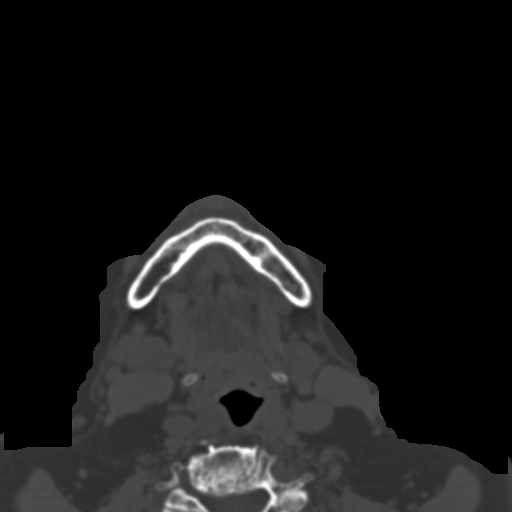
[im 63/210  bone]
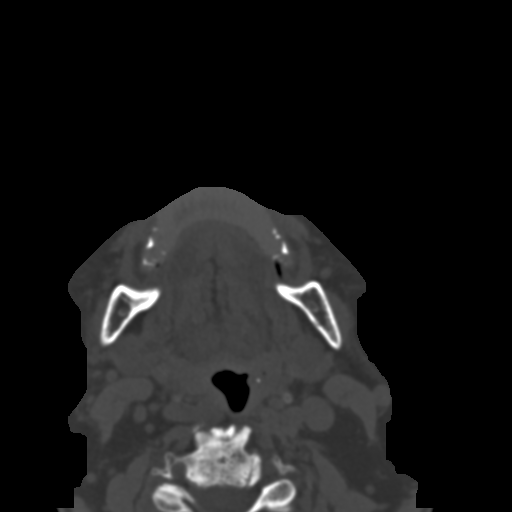
[im 84/210  bone]
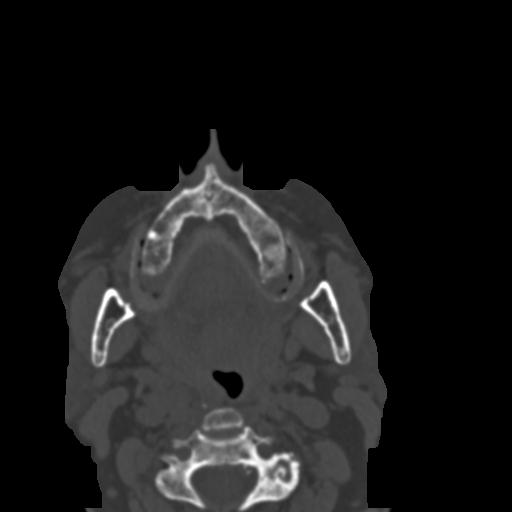
[im 105/210  brain]
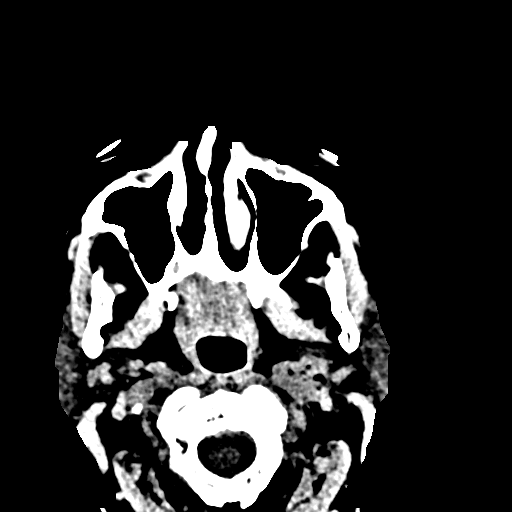
[im 105/210  bone]
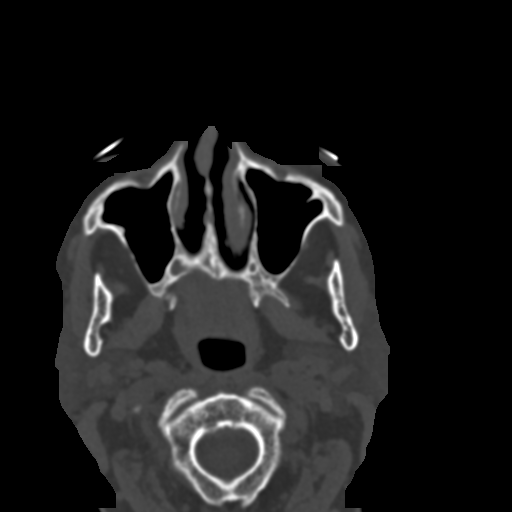
[im 126/210  bone]
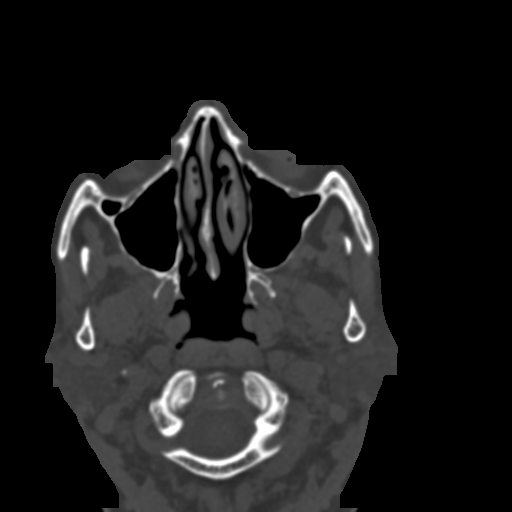
[im 147/210  bone]
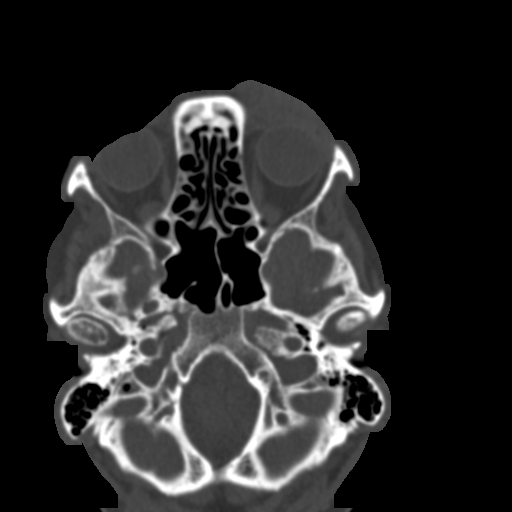
[im 168/210  bone]
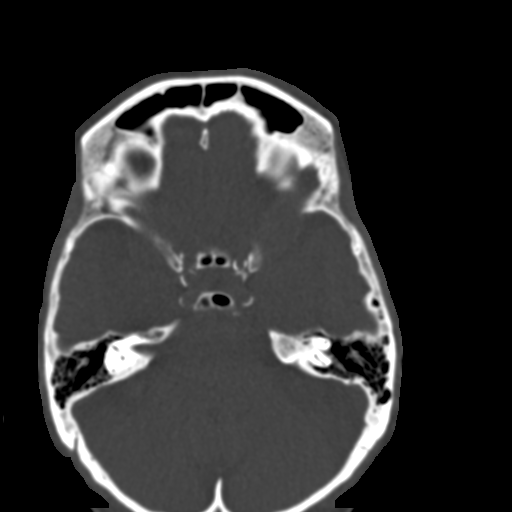
[im 189/210  brain]
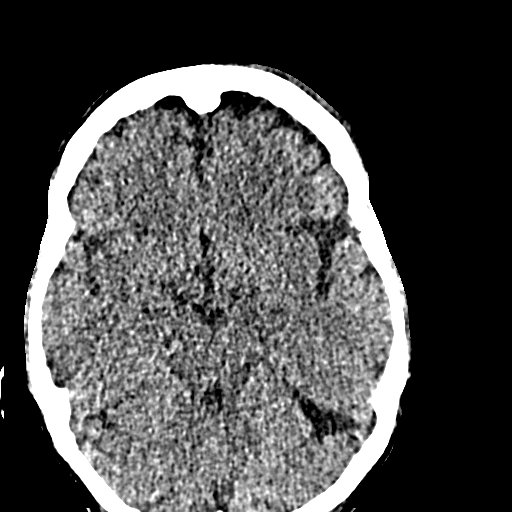
[im 189/210  bone]
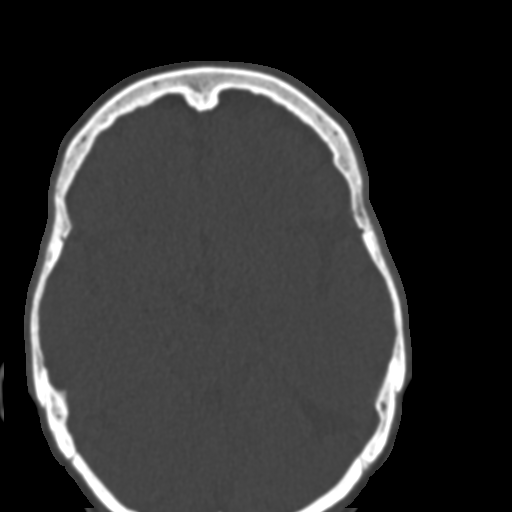

[Series 9: bone cor · coronal · 0.31mm/px · 3 of 110 slices shown]
[im 28/110  bone]
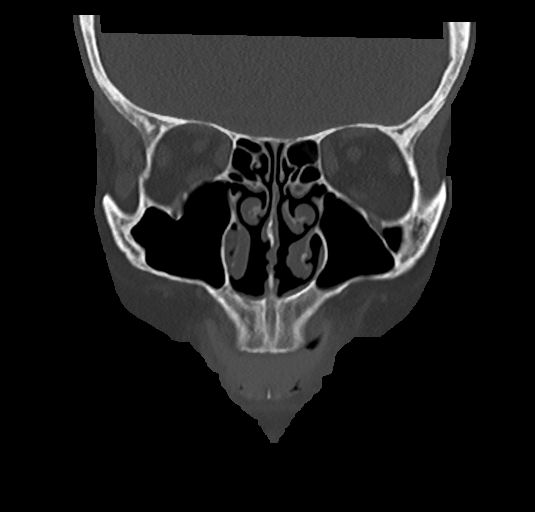
[im 55/110  bone]
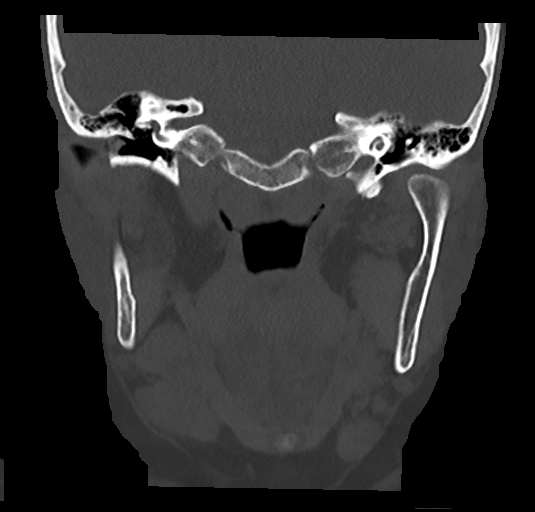
[im 82/110  bone]
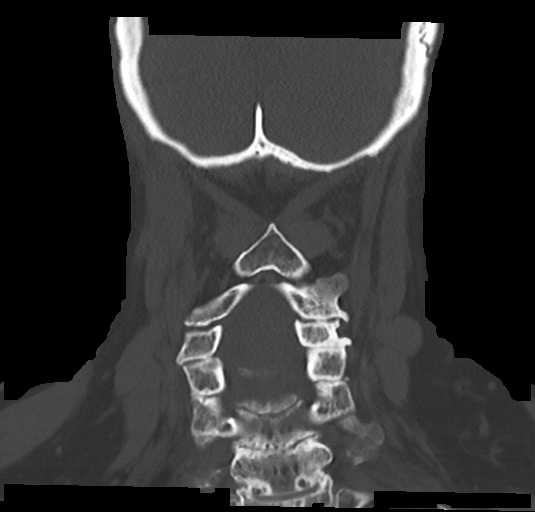

[Series 10: bone sag · sagittal · 0.31mm/px · 2 of 82 slices shown]
[im 28/82  bone]
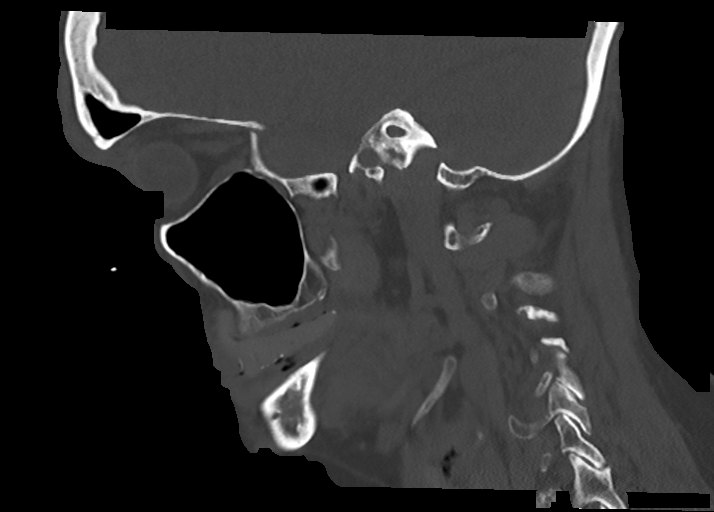
[im 55/82  bone]
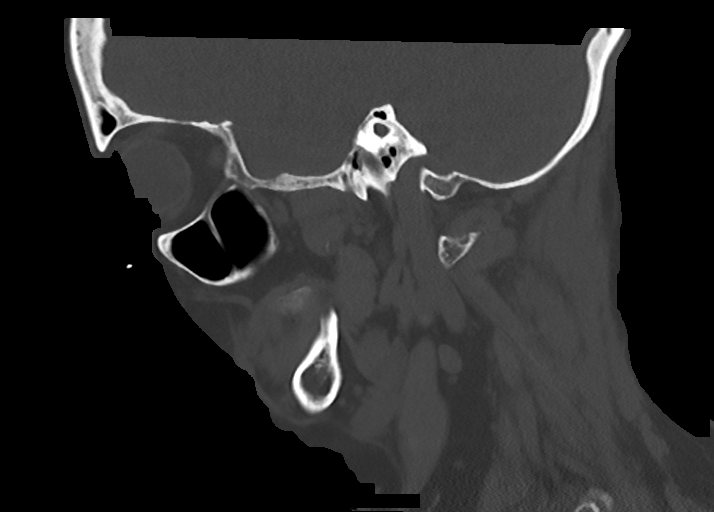

[14 of 47 positions shown; findings below may reference images not displayed]

FINDINGS: CT HEAD FINDINGS

Brain: No evidence of acute infarction, hemorrhage, hydrocephalus,
extra-axial collection or mass lesion/mass effect. Mild hypodensity
in the cerebral white matter compatible with chronic microvascular
ischemia.

Vascular: Negative for hyperdense vessel

Skull: Negative for skull fracture. Left supraorbital scalp
hematoma.

Other: None

CT MAXILLOFACIAL FINDINGS

Osseous: Negative for facial fracture. No fracture of the orbit or
mandible.

Severe cervical spondylosis.  No fracture identified.

Orbits: Right cataract extraction.  No orbital mass or edema.

Sinuses: Mild mucosal edema maxillary sinus bilaterally. No
air-fluid level.

Soft tissues: Right supraorbital scalp hematoma extending to the
upper eyelid. No extension into the orbit.
IMPRESSION: 1. No acute intracranial abnormality
2. Negative for facial fracture
3. Left supraorbital scalp hematoma.

## 2020-12-25 ENCOUNTER — Ambulatory Visit: Payer: PPO | Admitting: Cardiology

## 2021-01-01 ENCOUNTER — Other Ambulatory Visit: Payer: Self-pay | Admitting: Cardiology

## 2021-01-01 NOTE — Telephone Encounter (Signed)
Rx(s) sent to pharmacy electronically.  

## 2021-01-12 NOTE — Progress Notes (Signed)
HPI: FU atrial fibrillation. Patient admitted May 2020 with respiratory failure and was treated for pneumonia/sepsis. Course was complicated by atrial fibrillation and renal insufficiency. She was treated with Cardizem and apixaban. Echocardiogram June 2020 showed normal LV function and mild biatrial enlargement. There was mild aortic stenosis mean gradient 9 mmHg.Since last seen, she does note dyspnea on exertion which is unchanged.  There is no orthopnea or PND but she describes bilateral ankle edema.  No chest pain, palpitations or syncope.  No bleeding.  Current Outpatient Medications  Medication Sig Dispense Refill  . acetaminophen (TYLENOL) 500 MG tablet Take 500 mg by mouth every 6 (six) hours as needed for mild pain.    Marland Kitchen alendronate (FOSAMAX) 70 MG tablet Take 70 mg by mouth once a week.    Marland Kitchen apixaban (ELIQUIS) 5 MG TABS tablet Take 1 tablet (5 mg total) by mouth 2 (two) times daily. 60 tablet 1  . calcium carbonate (CALCIUM 600) 600 MG TABS tablet Take 600 mg by mouth daily with breakfast.    . Cholecalciferol (VITAMIN D3 PO) Take by mouth daily.    Marland Kitchen diltiazem (CARDIZEM CD) 240 MG 24 hr capsule Take 1 capsule by mouth once daily 90 capsule 0  . famotidine (PEPCID) 20 MG tablet Take 1 tablet (20 mg total) by mouth daily.     No current facility-administered medications for this visit.     Past Medical History:  Diagnosis Date  . Constipation   . Fracture of femur, right, closed ([place]) 2017  . Hypertension   . Overactive bladder   . Symptomatic anemia 03/2016    Past Surgical History:  Procedure Laterality Date  . KNEE ARTHROPLASTY Right    approx 10 years ago  . ORIF PERIPROSTHETIC FRACTURE Right 04/12/2016   Procedure: OPEN REDUCTION INTERNAL FIXATION (ORIF) RIGHT DISTAL FEMUR  PERIPROSTHETIC FRACTURE;  Surgeon: Paralee Cancel, MD;  Location: [facility];  Service: Orthopedics;  Laterality: Right;  . ROTATOR CUFF REPAIR Right 1990's    Social History    Socioeconomic History  . Marital status: Married    Spouse name: Not on file  . Number of children: 3  . Years of education: Not on file  . Highest education level: Not on file  Occupational History  . Not on file  Tobacco Use  . Smoking status: Never Smoker  . Smokeless tobacco: Never Used  Substance and Sexual Activity  . Alcohol use: No  . Drug use: No  . Sexual activity: Not on file  Other Topics Concern  . Not on file  Social History Narrative  . Not on file   Social Determinants of Health   Financial Resource Strain: Not on file  Food Insecurity: Not on file  Transportation Needs: Not on file  Physical Activity: Not on file  Stress: Not on file  Social Connections: Not on file  Intimate Partner Violence: Not on file    Family History  Problem Relation Age of Onset  . Hypertension Mother   . Colon cancer Father   . Valvular heart disease Sister   . Kidney disease Brother   . Alcoholism Brother   . Lung cancer Sister     ROS: Knee arthralgias but no fevers or chills, productive cough, hemoptysis, dysphasia, odynophagia, melena, hematochezia, dysuria, hematuria, rash, seizure activity, orthopnea, PND, pedal edema, claudication. Remaining systems are negative.  Physical Exam: Well-developed well-nourished in no acute distress.  Skin is warm and dry.  HEENT is normal.  Neck  is supple.  Chest is clear to auscultation with normal expansion.  Cardiovascular exam is regular rate and rhythm.  Abdominal exam nontender or distended. No masses palpated. Extremities show trace ankle edema. neuro grossly intact  ECG-normal sinus rhythm at a rate of 77, normal axis.  No ST changes.  Personally reviewed  A/P  1 paroxysmal atrial fibrillation-patient remains in sinus rhythm.  We will continue Cardizem and apixaban.  Note patient fell in September.  If she has more frequent episodes in the future we will need to consider discontinuing apixaban as risk would outweigh  the benefit.  We will have most recent hemoglobin and renal function forwarded to [place] from primary care.  2 history of mild aortic stenosis-she will need follow-up echocardiograms in the future.  Note S2 is not diminished on physical examination today.  3 hypertension-blood pressure elevated.  She also notes bilateral pedal edema.  We will add chlorthalidone 25 mg daily.  Check potassium and renal function in 1 week.  4 dyspnea-likely contribution from deconditioning and obesity.  We will arrange echocardiogram to reassess LV function.  Kirk Ruths, MD

## 2021-01-21 ENCOUNTER — Encounter: Payer: Self-pay | Admitting: Cardiology

## 2021-01-21 ENCOUNTER — Other Ambulatory Visit: Payer: Self-pay

## 2021-01-21 ENCOUNTER — Ambulatory Visit: Payer: PPO | Admitting: Cardiology

## 2021-01-21 VITALS — BP 170/60 | HR 77 | Ht 60.0 in | Wt 171.6 lb

## 2021-01-21 DIAGNOSIS — I10 Essential (primary) hypertension: Secondary | ICD-10-CM | POA: Diagnosis not present

## 2021-01-21 DIAGNOSIS — R0602 Shortness of breath: Secondary | ICD-10-CM | POA: Diagnosis not present

## 2021-01-21 DIAGNOSIS — I350 Nonrheumatic aortic (valve) stenosis: Secondary | ICD-10-CM

## 2021-01-21 DIAGNOSIS — I480 Paroxysmal atrial fibrillation: Secondary | ICD-10-CM

## 2021-01-21 MED ORDER — CHLORTHALIDONE 25 MG PO TABS: 25.0000 mg | ORAL_TABLET | Freq: Every day | ORAL | 3 refills | Status: DC

## 2021-01-21 NOTE — Patient Instructions (Signed)
Medication Instructions:   START CHLORTHALIDONE 25 MG ONCE DAILY  *If you need a refill on your cardiac medications before your next appointment, please call your pharmacy*   Lab Work:  Your physician recommends that you return for lab work in: [place]  If you have labs (blood work) drawn today and your tests are completely normal, you will receive your results only by: Marland Kitchen MyChart Message (if you have MyChart) OR . A paper copy in the mail If you have any lab test that is abnormal or we need to change your treatment, we will call you to review the results.   Testing/Procedures:  Your physician has requested that you have an echocardiogram. Echocardiography is a painless test that uses sound waves to create images of your heart. It provides your doctor with information about the size and shape of your heart and how well your heart's chambers and valves are working. This procedure takes approximately one hour. There are no restrictions for this procedure.[place]     Follow-Up: At [facility], you and your health needs are our priority.  As part of our continuing mission to provide you with exceptional heart care, we have created designated Provider Care Teams.  These Care Teams include your primary Cardiologist (physician) and Advanced Practice Providers (APPs -  Physician Assistants and Nurse Practitioners) who all work together to provide you with the care you need, when you need it.  We recommend signing up for the patient portal called "MyChart".  Sign up information is provided on this After Visit Summary.  MyChart is used to connect with patients for Virtual Visits (Telemedicine).  Patients are able to view lab/test results, encounter notes, upcoming appointments, etc.  Non-urgent messages can be sent to your provider as well.   To learn more about what you can do with MyChart, go to [email or web address].    Your next appointment:   6 month(s)  The format  for your next appointment:   In Person  Provider:   Kirk Ruths, MD

## 2021-01-28 DIAGNOSIS — I10 Essential (primary) hypertension: Secondary | ICD-10-CM | POA: Diagnosis not present

## 2021-01-28 DIAGNOSIS — R0602 Shortness of breath: Secondary | ICD-10-CM | POA: Diagnosis not present

## 2021-01-28 LAB — BASIC METABOLIC PANEL
BUN/Creatinine Ratio: 21 (ref 12–28)
BUN: 20 mg/dL (ref 8–27)
CO2: 24 mmol/L (ref 20–29)
Calcium: 9.4 mg/dL (ref 8.7–10.3)
Chloride: 102 mmol/L (ref 96–106)
Creatinine, Ser: 0.96 mg/dL (ref 0.57–1.00)
Glucose: 94 mg/dL (ref 65–99)
Potassium: 3.5 mmol/L (ref 3.5–5.2)
Sodium: 141 mmol/L (ref 134–144)
eGFR: 58 mL/min/{1.73_m2} — ABNORMAL LOW (ref >59)

## 2021-01-30 ENCOUNTER — Encounter: Payer: Self-pay | Admitting: *Deleted

## 2021-02-03 ENCOUNTER — Telehealth: Payer: Self-pay | Admitting: *Deleted

## 2021-02-03 DIAGNOSIS — I10 Essential (primary) hypertension: Secondary | ICD-10-CM

## 2021-02-03 MED ORDER — LOSARTAN POTASSIUM 50 MG PO TABS: 50.0000 mg | ORAL_TABLET | Freq: Every day | ORAL | 3 refills | Status: DC

## 2021-02-03 NOTE — Telephone Encounter (Signed)
Patient brought a list of bp readings to the office and dr Stanford Breed has reviewed. bp range from 144/65-154/70. Spoke with pt, aware will be adding losartan 50 mg once daily. She will check a bmp in one week. New script sent to the pharmacy and Lab orders mailed to the pt.

## 2021-02-23 ENCOUNTER — Other Ambulatory Visit: Payer: Self-pay

## 2021-02-23 ENCOUNTER — Ambulatory Visit (HOSPITAL_COMMUNITY): Payer: PPO | Attending: Cardiovascular Disease

## 2021-02-23 ENCOUNTER — Encounter: Payer: Self-pay | Admitting: *Deleted

## 2021-02-23 DIAGNOSIS — I10 Essential (primary) hypertension: Secondary | ICD-10-CM | POA: Diagnosis not present

## 2021-02-23 DIAGNOSIS — R0602 Shortness of breath: Secondary | ICD-10-CM

## 2021-02-23 LAB — ECHOCARDIOGRAM COMPLETE
Area-P 1/2: 3.65 cm2
S' Lateral: 2.1 cm

## 2021-02-24 ENCOUNTER — Encounter: Payer: Self-pay | Admitting: *Deleted

## 2021-02-24 LAB — BASIC METABOLIC PANEL
BUN/Creatinine Ratio: 19 (ref 12–28)
BUN: 18 mg/dL (ref 8–27)
CO2: 22 mmol/L (ref 20–29)
Calcium: 9.5 mg/dL (ref 8.7–10.3)
Chloride: 102 mmol/L (ref 96–106)
Creatinine, Ser: 0.95 mg/dL (ref 0.57–1.00)
Glucose: 90 mg/dL (ref 65–99)
Potassium: 4.4 mmol/L (ref 3.5–5.2)
Sodium: 141 mmol/L (ref 134–144)
eGFR: 59 mL/min/{1.73_m2} — ABNORMAL LOW (ref >59)

## 2021-03-04 DIAGNOSIS — I480 Paroxysmal atrial fibrillation: Secondary | ICD-10-CM | POA: Diagnosis not present

## 2021-03-04 DIAGNOSIS — M810 Age-related osteoporosis without current pathological fracture: Secondary | ICD-10-CM | POA: Diagnosis not present

## 2021-03-04 DIAGNOSIS — E7800 Pure hypercholesterolemia, unspecified: Secondary | ICD-10-CM | POA: Diagnosis not present

## 2021-03-04 DIAGNOSIS — M159 Polyosteoarthritis, unspecified: Secondary | ICD-10-CM | POA: Diagnosis not present

## 2021-03-04 DIAGNOSIS — K219 Gastro-esophageal reflux disease without esophagitis: Secondary | ICD-10-CM | POA: Diagnosis not present

## 2021-03-04 DIAGNOSIS — I10 Essential (primary) hypertension: Secondary | ICD-10-CM | POA: Diagnosis not present

## 2021-03-30 DIAGNOSIS — M159 Polyosteoarthritis, unspecified: Secondary | ICD-10-CM | POA: Diagnosis not present

## 2021-03-30 DIAGNOSIS — I480 Paroxysmal atrial fibrillation: Secondary | ICD-10-CM | POA: Diagnosis not present

## 2021-03-30 DIAGNOSIS — M810 Age-related osteoporosis without current pathological fracture: Secondary | ICD-10-CM | POA: Diagnosis not present

## 2021-03-30 DIAGNOSIS — I10 Essential (primary) hypertension: Secondary | ICD-10-CM | POA: Diagnosis not present

## 2021-03-30 DIAGNOSIS — K219 Gastro-esophageal reflux disease without esophagitis: Secondary | ICD-10-CM | POA: Diagnosis not present

## 2021-03-30 DIAGNOSIS — E7800 Pure hypercholesterolemia, unspecified: Secondary | ICD-10-CM | POA: Diagnosis not present

## 2021-04-03 ENCOUNTER — Other Ambulatory Visit: Payer: Self-pay | Admitting: Cardiology

## 2021-04-07 DIAGNOSIS — I10 Essential (primary) hypertension: Secondary | ICD-10-CM | POA: Diagnosis not present

## 2021-04-07 DIAGNOSIS — R946 Abnormal results of thyroid function studies: Secondary | ICD-10-CM | POA: Diagnosis not present

## 2021-04-07 DIAGNOSIS — I480 Paroxysmal atrial fibrillation: Secondary | ICD-10-CM | POA: Diagnosis not present

## 2021-04-07 DIAGNOSIS — M159 Polyosteoarthritis, unspecified: Secondary | ICD-10-CM | POA: Diagnosis not present

## 2021-04-07 DIAGNOSIS — E7800 Pure hypercholesterolemia, unspecified: Secondary | ICD-10-CM | POA: Diagnosis not present

## 2021-04-07 DIAGNOSIS — K219 Gastro-esophageal reflux disease without esophagitis: Secondary | ICD-10-CM | POA: Diagnosis not present

## 2021-06-17 DIAGNOSIS — I10 Essential (primary) hypertension: Secondary | ICD-10-CM | POA: Diagnosis not present

## 2021-06-17 DIAGNOSIS — I480 Paroxysmal atrial fibrillation: Secondary | ICD-10-CM | POA: Diagnosis not present

## 2021-06-17 DIAGNOSIS — E7800 Pure hypercholesterolemia, unspecified: Secondary | ICD-10-CM | POA: Diagnosis not present

## 2021-06-17 DIAGNOSIS — K219 Gastro-esophageal reflux disease without esophagitis: Secondary | ICD-10-CM | POA: Diagnosis not present

## 2021-06-17 DIAGNOSIS — M810 Age-related osteoporosis without current pathological fracture: Secondary | ICD-10-CM | POA: Diagnosis not present

## 2021-06-17 DIAGNOSIS — M159 Polyosteoarthritis, unspecified: Secondary | ICD-10-CM | POA: Diagnosis not present

## 2021-07-01 ENCOUNTER — Other Ambulatory Visit: Payer: Self-pay | Admitting: Cardiology

## 2021-07-01 NOTE — Progress Notes (Signed)
HPI: FU atrial fibrillation. Patient admitted May 2020 with respiratory failure and was treated for pneumonia/sepsis.  Course was complicated by atrial fibrillation and renal insufficiency.  She was treated with Cardizem and apixaban.  Echocardiogram June 2022 showed normal LV function and mild right atrial enlargement.  Since last seen, patient has dyspnea on exertion which is unchanged.  No orthopnea, PND, pedal edema, chest pain, palpitations or syncope.  No bleeding.  Current Outpatient Medications  Medication Sig Dispense Refill   acetaminophen (TYLENOL) 500 MG tablet Take 500 mg by mouth every 6 (six) hours as needed for mild pain.     alendronate (FOSAMAX) 70 MG tablet Take 70 mg by mouth once a week.     apixaban (ELIQUIS) 5 MG TABS tablet Take 1 tablet (5 mg total) by mouth 2 (two) times daily. 60 tablet 1   calcium carbonate (OS-CAL) 600 MG TABS tablet Take 600 mg by mouth daily with breakfast.     Cholecalciferol (VITAMIN D3 PO) Take by mouth daily.     diltiazem (CARDIZEM CD) 240 MG 24 hr capsule Take 1 capsule by mouth once daily 90 capsule 0   famotidine (PEPCID) 20 MG tablet Take 1 tablet (20 mg total) by mouth daily.     chlorthalidone (HYGROTON) 25 MG tablet Take 1 tablet (25 mg total) by mouth daily. 90 tablet 3   losartan (COZAAR) 50 MG tablet Take 1 tablet (50 mg total) by mouth daily. 90 tablet 3   No current facility-administered medications for this visit.     Past Medical History:  Diagnosis Date   Constipation    Fracture of femur, right, closed ([place]) 2017   Hypertension    Overactive bladder    Symptomatic anemia 03/2016    Past Surgical History:  Procedure Laterality Date   KNEE ARTHROPLASTY Right    approx 10 years ago   ORIF PERIPROSTHETIC FRACTURE Right 04/12/2016   Procedure: OPEN REDUCTION INTERNAL FIXATION (ORIF) RIGHT DISTAL FEMUR  PERIPROSTHETIC FRACTURE;  Surgeon: Paralee Cancel, MD;  Location: [facility];  Service: Orthopedics;  Laterality:  Right;   ROTATOR CUFF REPAIR Right 1990's    Social History   Socioeconomic History   Marital status: Married    Spouse name: Not on file   Number of children: 3   Years of education: Not on file   Highest education level: Not on file  Occupational History   Not on file  Tobacco Use   Smoking status: Never   Smokeless tobacco: Never  Substance and Sexual Activity   Alcohol use: No   Drug use: No   Sexual activity: Not on file  Other Topics Concern   Not on file  Social History Narrative   Not on file   Social Determinants of Health   Financial Resource Strain: Not on file  Food Insecurity: Not on file  Transportation Needs: Not on file  Physical Activity: Not on file  Stress: Not on file  Social Connections: Not on file  Intimate Partner Violence: Not on file    Family History  Problem Relation Age of Onset   Hypertension Mother    Colon cancer Father    Valvular heart disease Sister    Kidney disease Brother    Alcoholism Brother    Lung cancer Sister     ROS: no fevers or chills, productive cough, hemoptysis, dysphasia, odynophagia, melena, hematochezia, dysuria, hematuria, rash, seizure activity, orthopnea, PND, pedal edema, claudication. Remaining systems are negative.  Physical  Exam: Well-developed well-nourished in no acute distress.  Skin is warm and dry.  HEENT is normal.  Neck is supple.  Chest is clear to auscultation with normal expansion.  Cardiovascular exam is regular rate and rhythm.  Abdominal exam nontender or distended. No masses palpated. Extremities show no edema. neuro grossly intact  A/P  1 PAF-patient remains in sinus rhythm today.  Continue Cardizem and apixaban at present dose.  2 history of mild aortic stenosis-not evident on most recent echocardiogram.  3 hypertension-blood pressure controlled.  Continue present medications and follow.  Kirk Ruths, MD

## 2021-07-10 ENCOUNTER — Ambulatory Visit: Payer: PPO | Admitting: Cardiology

## 2021-07-10 ENCOUNTER — Other Ambulatory Visit: Payer: Self-pay

## 2021-07-10 ENCOUNTER — Encounter: Payer: Self-pay | Admitting: Cardiology

## 2021-07-10 VITALS — BP 138/56 | HR 76 | Ht 59.0 in | Wt 168.0 lb

## 2021-07-10 DIAGNOSIS — I350 Nonrheumatic aortic (valve) stenosis: Secondary | ICD-10-CM | POA: Diagnosis not present

## 2021-07-10 DIAGNOSIS — I10 Essential (primary) hypertension: Secondary | ICD-10-CM | POA: Diagnosis not present

## 2021-07-10 DIAGNOSIS — I480 Paroxysmal atrial fibrillation: Secondary | ICD-10-CM

## 2021-07-10 NOTE — Patient Instructions (Signed)

## 2021-09-24 ENCOUNTER — Other Ambulatory Visit: Payer: Self-pay | Admitting: Cardiology

## 2021-10-13 ENCOUNTER — Inpatient Hospital Stay (HOSPITAL_COMMUNITY)
Admission: EM | Admit: 2021-10-13 | Discharge: 2021-10-16 | DRG: 395 | Disposition: A | Discharge status: Home / Self Care | Payer: PPO | Attending: Internal Medicine | Admitting: Internal Medicine

## 2021-10-13 ENCOUNTER — Encounter (HOSPITAL_COMMUNITY): Payer: Self-pay | Admitting: Emergency Medicine

## 2021-10-13 ENCOUNTER — Other Ambulatory Visit: Payer: Self-pay

## 2021-10-13 DIAGNOSIS — Z841 Family history of disorders of kidney and ureter: Secondary | ICD-10-CM | POA: Diagnosis not present

## 2021-10-13 DIAGNOSIS — K2980 Duodenitis without bleeding: Secondary | ICD-10-CM | POA: Diagnosis present

## 2021-10-13 DIAGNOSIS — Z6372 Alcoholism and drug addiction in family: Secondary | ICD-10-CM

## 2021-10-13 DIAGNOSIS — F5101 Primary insomnia: Secondary | ICD-10-CM | POA: Diagnosis not present

## 2021-10-13 DIAGNOSIS — L0293 Carbuncle, unspecified: Secondary | ICD-10-CM | POA: Diagnosis present

## 2021-10-13 DIAGNOSIS — K2990 Gastroduodenitis, unspecified, without bleeding: Secondary | ICD-10-CM | POA: Diagnosis not present

## 2021-10-13 DIAGNOSIS — N3281 Overactive bladder: Secondary | ICD-10-CM | POA: Diagnosis present

## 2021-10-13 DIAGNOSIS — Z79899 Other long term (current) drug therapy: Secondary | ICD-10-CM

## 2021-10-13 DIAGNOSIS — Z8249 Family history of ischemic heart disease and other diseases of the circulatory system: Secondary | ICD-10-CM | POA: Diagnosis not present

## 2021-10-13 DIAGNOSIS — K219 Gastro-esophageal reflux disease without esophagitis: Secondary | ICD-10-CM | POA: Diagnosis not present

## 2021-10-13 DIAGNOSIS — I10 Essential (primary) hypertension: Secondary | ICD-10-CM | POA: Diagnosis present

## 2021-10-13 DIAGNOSIS — Z7901 Long term (current) use of anticoagulants: Secondary | ICD-10-CM | POA: Diagnosis not present

## 2021-10-13 DIAGNOSIS — I480 Paroxysmal atrial fibrillation: Secondary | ICD-10-CM | POA: Diagnosis present

## 2021-10-13 DIAGNOSIS — Z811 Family history of alcohol abuse and dependence: Secondary | ICD-10-CM | POA: Diagnosis not present

## 2021-10-13 DIAGNOSIS — R9431 Abnormal electrocardiogram [ECG] [EKG]: Secondary | ICD-10-CM | POA: Diagnosis not present

## 2021-10-13 DIAGNOSIS — K5730 Diverticulosis of large intestine without perforation or abscess without bleeding: Secondary | ICD-10-CM | POA: Diagnosis present

## 2021-10-13 DIAGNOSIS — D124 Benign neoplasm of descending colon: Secondary | ICD-10-CM | POA: Diagnosis not present

## 2021-10-13 DIAGNOSIS — K7460 Unspecified cirrhosis of liver: Secondary | ICD-10-CM | POA: Diagnosis present

## 2021-10-13 DIAGNOSIS — K2100 Gastro-esophageal reflux disease with esophagitis, without bleeding: Secondary | ICD-10-CM | POA: Diagnosis present

## 2021-10-13 DIAGNOSIS — K922 Gastrointestinal hemorrhage, unspecified: Secondary | ICD-10-CM | POA: Diagnosis not present

## 2021-10-13 DIAGNOSIS — E7800 Pure hypercholesterolemia, unspecified: Secondary | ICD-10-CM | POA: Diagnosis not present

## 2021-10-13 DIAGNOSIS — Z801 Family history of malignant neoplasm of trachea, bronchus and lung: Secondary | ICD-10-CM

## 2021-10-13 DIAGNOSIS — R935 Abnormal findings on diagnostic imaging of other abdominal regions, including retroperitoneum: Secondary | ICD-10-CM

## 2021-10-13 DIAGNOSIS — K2970 Gastritis, unspecified, without bleeding: Secondary | ICD-10-CM | POA: Diagnosis present

## 2021-10-13 DIAGNOSIS — K648 Other hemorrhoids: Secondary | ICD-10-CM | POA: Diagnosis present

## 2021-10-13 DIAGNOSIS — L89311 Pressure ulcer of right buttock, stage 1: Secondary | ICD-10-CM | POA: Diagnosis present

## 2021-10-13 DIAGNOSIS — E785 Hyperlipidemia, unspecified: Secondary | ICD-10-CM | POA: Diagnosis present

## 2021-10-13 DIAGNOSIS — D509 Iron deficiency anemia, unspecified: Secondary | ICD-10-CM | POA: Diagnosis present

## 2021-10-13 DIAGNOSIS — M159 Polyosteoarthritis, unspecified: Secondary | ICD-10-CM | POA: Diagnosis not present

## 2021-10-13 DIAGNOSIS — Z0389 Encounter for observation for other suspected diseases and conditions ruled out: Secondary | ICD-10-CM | POA: Diagnosis not present

## 2021-10-13 DIAGNOSIS — K635 Polyp of colon: Secondary | ICD-10-CM | POA: Diagnosis present

## 2021-10-13 DIAGNOSIS — Z800 Family history of malignant neoplasm of digestive organs: Secondary | ICD-10-CM

## 2021-10-13 DIAGNOSIS — D123 Benign neoplasm of transverse colon: Secondary | ICD-10-CM | POA: Diagnosis not present

## 2021-10-13 DIAGNOSIS — Z7983 Long term (current) use of bisphosphonates: Secondary | ICD-10-CM | POA: Diagnosis not present

## 2021-10-13 DIAGNOSIS — Z20822 Contact with and (suspected) exposure to covid-19: Secondary | ICD-10-CM | POA: Diagnosis present

## 2021-10-13 DIAGNOSIS — D649 Anemia, unspecified: Secondary | ICD-10-CM | POA: Diagnosis present

## 2021-10-13 DIAGNOSIS — R58 Hemorrhage, not elsewhere classified: Secondary | ICD-10-CM

## 2021-10-13 DIAGNOSIS — D122 Benign neoplasm of ascending colon: Secondary | ICD-10-CM | POA: Diagnosis not present

## 2021-10-13 LAB — CBC WITH DIFFERENTIAL/PLATELET
Abs Immature Granulocytes: 0.01 10*3/uL (ref 0.00–0.07)
Basophils Absolute: 0.1 10*3/uL (ref 0.0–0.1)
Basophils Relative: 1 %
Eosinophils Absolute: 0.2 10*3/uL (ref 0.0–0.5)
Eosinophils Relative: 3 %
HCT: 25.2 % — ABNORMAL LOW (ref 36.0–46.0)
Hemoglobin: 6.8 g/dL — CL (ref 12.0–15.0)
Immature Granulocytes: 0 %
Lymphocytes Relative: 27 %
Lymphs Abs: 1.5 10*3/uL (ref 0.7–4.0)
MCH: 20.4 pg — ABNORMAL LOW (ref 26.0–34.0)
MCHC: 27 g/dL — ABNORMAL LOW (ref 30.0–36.0)
MCV: 75.4 fL — ABNORMAL LOW (ref 80.0–100.0)
Monocytes Absolute: 0.7 10*3/uL (ref 0.1–1.0)
Monocytes Relative: 11 %
Neutro Abs: 3.3 10*3/uL (ref 1.7–7.7)
Neutrophils Relative %: 58 %
Platelets: 349 10*3/uL (ref 150–400)
RBC: 3.34 MIL/uL — ABNORMAL LOW (ref 3.87–5.11)
RDW: 19.2 % — ABNORMAL HIGH (ref 11.5–15.5)
WBC: 5.8 10*3/uL (ref 4.0–10.5)
nRBC: 0 % (ref 0.0–0.2)

## 2021-10-13 LAB — COMPREHENSIVE METABOLIC PANEL
ALT: 13 U/L (ref 0–44)
AST: 19 U/L (ref 15–41)
Albumin: 3.6 g/dL (ref 3.5–5.0)
Alkaline Phosphatase: 54 U/L (ref 38–126)
Anion gap: 11 (ref 5–15)
BUN: 31 mg/dL — ABNORMAL HIGH (ref 8–23)
CO2: 24 mmol/L (ref 22–32)
Calcium: 9.6 mg/dL (ref 8.9–10.3)
Chloride: 101 mmol/L (ref 98–111)
Creatinine, Ser: 1.35 mg/dL — ABNORMAL HIGH (ref 0.44–1.00)
GFR, Estimated: 38 mL/min — ABNORMAL LOW (ref >60)
Glucose, Bld: 104 mg/dL — ABNORMAL HIGH (ref 70–99)
Potassium: 3.7 mmol/L (ref 3.5–5.1)
Sodium: 136 mmol/L (ref 135–145)
Total Bilirubin: 0.4 mg/dL (ref 0.3–1.2)
Total Protein: 6.9 g/dL (ref 6.5–8.1)

## 2021-10-13 LAB — PREPARE RBC (CROSSMATCH)

## 2021-10-13 LAB — RESP PANEL BY RT-PCR (FLU A&B, COVID) ARPGX2
Influenza A by PCR: NEGATIVE
Influenza B by PCR: NEGATIVE
SARS Coronavirus 2 by RT PCR: NEGATIVE

## 2021-10-13 LAB — PROTIME-INR
INR: 1.4 — ABNORMAL HIGH (ref 0.8–1.2)
Prothrombin Time: 16.7 seconds — ABNORMAL HIGH (ref 11.4–15.2)

## 2021-10-13 LAB — POC OCCULT BLOOD, ED: Fecal Occult Bld: NEGATIVE

## 2021-10-13 MED ORDER — CHLORTHALIDONE 25 MG PO TABS
25.0000 mg | ORAL_TABLET | Freq: Every day | ORAL | Status: DC
  Filled 2021-10-13: qty 1

## 2021-10-13 MED ORDER — SODIUM CHLORIDE 0.9 % IV SOLN
10.0000 mL/h | Freq: Once | INTRAVENOUS | Status: AC
  Administered 2021-10-13: 10 mL/h via INTRAVENOUS

## 2021-10-13 MED ORDER — DILTIAZEM HCL 60 MG PO TABS
60.0000 mg | ORAL_TABLET | Freq: Four times a day (QID) | ORAL | Status: DC
  Filled 2021-10-13 (×3): qty 1

## 2021-10-13 MED ORDER — ACETAMINOPHEN 500 MG PO TABS
500.0000 mg | ORAL_TABLET | Freq: Four times a day (QID) | ORAL | Status: DC | PRN
  Administered 2021-10-14: 500 mg via ORAL
  Filled 2021-10-13: qty 1

## 2021-10-13 MED ORDER — PANTOPRAZOLE SODIUM 40 MG PO TBEC
40.0000 mg | DELAYED_RELEASE_TABLET | Freq: Every day | ORAL | Status: DC
  Administered 2021-10-13 – 2021-10-16 (×4): 40 mg via ORAL
  Filled 2021-10-13 (×4): qty 1

## 2021-10-13 MED ORDER — FAMOTIDINE 20 MG PO TABS
20.0000 mg | ORAL_TABLET | Freq: Every day | ORAL | Status: DC
  Administered 2021-10-14 – 2021-10-15 (×2): 20 mg via ORAL
  Filled 2021-10-13 (×2): qty 1

## 2021-10-13 MED ORDER — DILTIAZEM HCL 60 MG PO TABS
60.0000 mg | ORAL_TABLET | Freq: Four times a day (QID) | ORAL | Status: DC
  Administered 2021-10-14 – 2021-10-16 (×10): 60 mg via ORAL
  Filled 2021-10-13 (×12): qty 1

## 2021-10-13 MED ORDER — LOSARTAN POTASSIUM 50 MG PO TABS: 50.0000 mg | ORAL_TABLET | Freq: Every day | ORAL | Status: DC

## 2021-10-13 NOTE — H&P (Signed)
History and Physical    Amanda Berry WJX:[id number] DOB: 1933/09/13 DOA: 10/13/2021  PCP: Shirline Frees, MD (Confirm with patient/family/NH records and if not entered, this has to be entered at [facility] point of entry) Patient coming from: Home  I have personally briefly reviewed patient's old medical records in [place]  Chief Complaint: I feel fine  HPI: Amanda Berry is a 88 y.o. female with medical history significant of PAF on Eliquis, HTN, HLD, remote history of iron deficiency anemia, was sent from PCPs office for evaluation of new onset of anemia.  Patient denies any symptoms of shortness of breath lightheadedness or chest pains.  She says she has a chronic skin wound on the skin fold of her buttock, occasionally has bleeding when she wiped.  Denies any black tarry stool or blood in the stool.  No abdominal pain.  Today, she went to see PCP for 11-month follow-up, CBC showed new onset of anemia hemoglobin 6.8 compared to 11 in November 2022.  PCP decided to send her to ED.  ED Course: No tachycardia no hypotension.  No hypoxia.  Hemoglobin 6.8, WBC 5.8, creatinine 1.3 BUN 31.  PRBC x2 ordered in ED.  Review of Systems: As per HPI otherwise 14 point review of systems negative.    Past Medical History:  Diagnosis Date   Constipation    Fracture of femur, right, closed ([place]) 2017   Hypertension    Overactive bladder    Symptomatic anemia 03/2016    Past Surgical History:  Procedure Laterality Date   KNEE ARTHROPLASTY Right    approx 10 years ago   ORIF PERIPROSTHETIC FRACTURE Right 04/12/2016   Procedure: OPEN REDUCTION INTERNAL FIXATION (ORIF) RIGHT DISTAL FEMUR  PERIPROSTHETIC FRACTURE;  Surgeon: Paralee Cancel, MD;  Location: [facility];  Service: Orthopedics;  Laterality: Right;   ROTATOR CUFF REPAIR Right 1990's     reports that she has never smoked. She has never used smokeless tobacco. She reports that she does not drink alcohol and does not use drugs.  No Known  Allergies  Family History  Problem Relation Age of Onset   Hypertension Mother    Colon cancer Father    Valvular heart disease Sister    Kidney disease Brother    Alcoholism Brother    Lung cancer Sister      Prior to Admission medications   Medication Sig Start Date End Date Taking? Authorizing Provider  acetaminophen (TYLENOL) 500 MG tablet Take 500 mg by mouth every 6 (six) hours as needed for mild pain.    [provider]  alendronate (FOSAMAX) 70 MG tablet Take 70 mg by mouth once a week. 01/05/19   [provider]  apixaban (ELIQUIS) 5 MG TABS tablet Take 1 tablet (5 mg total) by mouth 2 (two) times daily. 01/31/19   Georgette Shell, MD  calcium carbonate (OS-CAL) 600 MG TABS tablet Take 600 mg by mouth daily with breakfast.    [provider]  chlorthalidone (HYGROTON) 25 MG tablet Take 1 tablet (25 mg total) by mouth daily. 01/21/21 04/21/21  Lelon Perla, MD  Cholecalciferol (VITAMIN D3 PO) Take by mouth daily.    [provider]  diltiazem (CARDIZEM CD) 240 MG 24 hr capsule Take 1 capsule by mouth once daily 09/24/21   Lelon Perla, MD  famotidine (PEPCID) 20 MG tablet Take 1 tablet (20 mg total) by mouth daily. 02/01/19   Georgette Shell, MD  losartan (COZAAR) 50 MG tablet  Take 1 tablet (50 mg total) by mouth daily. 02/03/21 05/04/21  Lelon Perla, MD    Physical Exam: Vitals:   10/13/21 1447 10/13/21 1645  BP: (!) 159/56 (!) 126/56  Pulse: 100 77  Resp: 18 14  Temp: 98 F (36.7 C)   TempSrc: Oral   SpO2: 96% 99%    Constitutional: NAD, calm, comfortable Vitals:   10/13/21 1447 10/13/21 1645  BP: (!) 159/56 (!) 126/56  Pulse: 100 77  Resp: 18 14  Temp: 98 F (36.7 C)   TempSrc: Oral   SpO2: 96% 99%   Eyes: PERRL, lids and conjunctivae normal ENMT: Mucous membranes are moist. Posterior pharynx clear of any exudate or lesions.Normal dentition.  Neck: normal, supple, no masses, no thyromegaly Respiratory:  clear to auscultation bilaterally, no wheezing, no crackles. Normal respiratory effort. No accessory muscle use.  Cardiovascular: Regular rate and rhythm, no murmurs / rubs / gallops. No extremity edema. 2+ pedal pulses. No carotid bruits.  Abdomen: no tenderness, no masses palpated. No hepatosplenomegaly. Bowel sounds positive.  Musculoskeletal: no clubbing / cyanosis. No joint deformity upper and lower extremities. Good ROM, no contractures. Normal muscle tone.  Skin: no rashes, lesions, ulcers. No induration, small shallow ulcer between skin fold of buttock oozing blood. Neurologic: CN 2-12 grossly intact. Sensation intact, DTR normal. Strength 5/5 in all 4.  Psychiatric: Normal judgment and insight. Alert and oriented x 3. Normal mood.     Labs on Admission: I have personally reviewed following labs and imaging studies  CBC: Recent Labs  Lab 10/13/21 1502  WBC 5.8  NEUTROABS 3.3  HGB 6.8*  HCT 25.2*  MCV 75.4*  PLT 381   Basic Metabolic Panel: Recent Labs  Lab 10/13/21 1502  NA 136  K 3.7  CL 101  CO2 24  GLUCOSE 104*  BUN 31*  CREATININE 1.35*  CALCIUM 9.6   GFR: CrCl cannot be calculated (Unknown ideal weight.). Liver Function Tests: Recent Labs  Lab 10/13/21 1502  AST 19  ALT 13  ALKPHOS 54  BILITOT 0.4  PROT 6.9  ALBUMIN 3.6   No results for input(s): LIPASE, AMYLASE in the last 168 hours. No results for input(s): AMMONIA in the last 168 hours. Coagulation Profile: Recent Labs  Lab 10/13/21 1502  INR 1.4*   Cardiac Enzymes: No results for input(s): CKTOTAL, CKMB, CKMBINDEX, TROPONINI in the last 168 hours. BNP (last 3 results) No results for input(s): PROBNP in the last 8760 hours. HbA1C: No results for input(s): HGBA1C in the last 72 hours. CBG: No results for input(s): GLUCAP in the last 168 hours. Lipid Profile: No results for input(s): CHOL, HDL, LDLCALC, TRIG, CHOLHDL, LDLDIRECT in the last 72 hours. Thyroid Function Tests: No results  for input(s): TSH, T4TOTAL, FREET4, T3FREE, THYROIDAB in the last 72 hours. Anemia Panel: No results for input(s): VITAMINB12, FOLATE, FERRITIN, TIBC, IRON, RETICCTPCT in the last 72 hours. Urine analysis:    Component Value Date/Time   COLORURINE AMBER (A) 01/25/2019 1730   APPEARANCEUR CLOUDY (A) 01/25/2019 1730   LABSPEC 1.018 01/25/2019 1730   PHURINE 5.0 01/25/2019 1730   GLUCOSEU NEGATIVE 01/25/2019 1730   HGBUR SMALL (A) 01/25/2019 1730   BILIRUBINUR NEGATIVE 01/25/2019 1730   KETONESUR NEGATIVE 01/25/2019 1730   PROTEINUR 30 (A) 01/25/2019 1730   UROBILINOGEN 0.2 03/01/2007 1131   NITRITE NEGATIVE 01/25/2019 1730   LEUKOCYTESUR TRACE (A) 01/25/2019 1730    Radiological Exams on Admission: No results found.  EKG: Independently reviewed. Sinus, no acute  ST-T changes  Assessment/Plan Principal Problem:   Anemia Active Problems:   Symptomatic anemia  (please populate well all problems here in Problem List. (For example, if patient is on BP meds at home and you resume or decide to hold them, it is a problem that needs to be her. Same for CAD, COPD, HLD and so on)  Acute on chronic iron deficiency anemia -BUN/Cre slight elevated. Suspect GI source. No Hx of GI bleed and never had EGD or colonoscopy.  Given her mild symptoms, suspect slow bleeding process.  Getting PRBC x2, repeat H&H tonight and tomorrow morning, if hemoglobin level remained stable, expect patient discharged home follow-up with GI as outpatient. -We will add PPI daily. -Guaiac test negative in ED. -Check iron study and reticulocyte count.  Stage I pressure ulcer on buttocks -Between skin folds, will consult wound care.  HTN -Hold all home BP meds tonight and if H/H stable, restart in AM.  PAF -In sinus, will change long-acting Cardizem to short-acting  DVT prophylaxis: SCD Code Status: Full code Family Communication: None at bedside Disposition Plan: Expect less than 2 midnight hospital  stay Consults called: None Admission status: Medsurg Obs   Lequita Halt MD [facility] Pager [phone]  10/13/2021, 5:15 PM

## 2021-10-13 NOTE — ED Triage Notes (Signed)
Pt. Stated, I had a Dr.appt today and they drew my blood and called me and said I needed a blood transfusion . My hgb. 6.8

## 2021-10-13 NOTE — ED Notes (Signed)
Hgb 6.8**

## 2021-10-13 NOTE — ED Provider Triage Note (Signed)
Emergency Medicine Provider Triage Evaluation Note  Amanda Berry , a 88 y.o. female  was evaluated in triage.  Pt complains of low hemoglobin.  States her labs were checked earlier today, she had hemoglobin of 6.8.  I am unable to view the results of these labs.  No dark and tarry stool, no blood in the stool that she has noticed.  No abdominal pain.  Review of Systems  Positive: LOW HGB Negative: She denies any symptoms, not fatigued or dizzy or lightheaded.  Denies any chest pain or shortness of breath..  Physical Exam  BP (!) 159/56 (BP Location: Right Arm)    Pulse 100    Temp 98 F (36.7 C) (Oral)    Resp 18    SpO2 96%  Gen:   Awake, no distress   Resp:  Normal effort  MSK:   Moves extremities without difficulty  Other:  Pale   Medical Decision Making  Medically screening exam initiated at 2:54 PM.  Appropriate orders placed.  [name] was informed that the remainder of the evaluation will be completed by another provider, this initial triage assessment does not replace that evaluation, and the importance of remaining in the ED until their evaluation is complete.     Sherrill Raring, PA-C 10/13/21 1455

## 2021-10-13 NOTE — ED Provider Notes (Signed)
[facility] EMERGENCY DEPARTMENT Provider Note   CSN: [id number] Arrival date & time: 10/13/21  1418     History  Chief Complaint  Patient presents with   abnormal labs    Amanda Berry is a 88 y.o. female.  HPI She presents for evaluation of known anemia, found during routine screening today when she saw her PCP for every 6 month checkup.  She denies visible blood loss.  She is not having any shortness of breath, weakness or dizziness.  She occasionally notices a little bit of stool incontinence, when she urinates.  This has been going on for several months.  She denies back pain.  She is not having fever or gastrointestinal symptoms.  No anorexia.    Home Medications Prior to Admission medications   Medication Sig Start Date End Date Taking? Authorizing Provider  acetaminophen (TYLENOL) 500 MG tablet Take 500 mg by mouth every 6 (six) hours as needed for mild pain.    [provider]  alendronate (FOSAMAX) 70 MG tablet Take 70 mg by mouth once a week. 01/05/19   [provider]  apixaban (ELIQUIS) 5 MG TABS tablet Take 1 tablet (5 mg total) by mouth 2 (two) times daily. 01/31/19   Georgette Shell, MD  calcium carbonate (OS-CAL) 600 MG TABS tablet Take 600 mg by mouth daily with breakfast.    [provider]  chlorthalidone (HYGROTON) 25 MG tablet Take 1 tablet (25 mg total) by mouth daily. 01/21/21 04/21/21  Lelon Perla, MD  Cholecalciferol (VITAMIN D3 PO) Take by mouth daily.    [provider]  diltiazem (CARDIZEM CD) 240 MG 24 hr capsule Take 1 capsule by mouth once daily 09/24/21   Lelon Perla, MD  famotidine (PEPCID) 20 MG tablet Take 1 tablet (20 mg total) by mouth daily. 02/01/19   Georgette Shell, MD  losartan (COZAAR) 50 MG tablet Take 1 tablet (50 mg total) by mouth daily. 02/03/21 05/04/21  Lelon Perla, MD      Allergies    Patient has no known allergies.    Review of Systems   Review of  Systems  Physical Exam Updated Vital Signs BP (!) 126/56    Pulse 77    Temp 98 F (36.7 C) (Oral)    Resp 14    SpO2 99%  Physical Exam Vitals and nursing note reviewed.  Constitutional:      General: She is not in acute distress.    Appearance: She is well-developed. She is not ill-appearing, toxic-appearing or diaphoretic.  HENT:     Head: Normocephalic and atraumatic.     Right Ear: External ear normal.     Left Ear: External ear normal.     Nose: No congestion or rhinorrhea.  Eyes:     Conjunctiva/sclera: Conjunctivae normal.     Pupils: Pupils are equal, round, and reactive to light.  Neck:     Trachea: Phonation normal.  Cardiovascular:     Rate and Rhythm: Normal rate and regular rhythm.     Heart sounds: Normal heart sounds.  Pulmonary:     Effort: Pulmonary effort is normal. No respiratory distress.     Breath sounds: Normal breath sounds. No stridor.  Abdominal:     General: There is no distension.     Palpations: Abdomen is soft.     Tenderness: There is no abdominal tenderness.  Genitourinary:    Comments: Anus with a small amount of stool externally, brown  in color.  On digital exam there is no fecal impaction or palpable mass.  Stool sent for Hemoccult testing. Musculoskeletal:        General: Normal range of motion.     Cervical back: Normal range of motion and neck supple.  Skin:    General: Skin is warm and dry.     Comments: Right medial buttocks with carbuncle versus chronic wound, approximate centimeter in diameter, with a opening over the top of it, which is actively bleeding a small amount of blood.  The blood drainage stops with light pressure.  This wound is about 4 cm from the anus.  Neurological:     Mental Status: She is alert and oriented to person, place, and time.     Cranial Nerves: No cranial nerve deficit.     Sensory: No sensory deficit.     Motor: No abnormal muscle tone.     Coordination: Coordination normal.  Psychiatric:        Mood  and Affect: Mood normal.        Behavior: Behavior normal.        Thought Content: Thought content normal.        Judgment: Judgment normal.    ED Results / Procedures / Treatments   Labs (all labs ordered are listed, but only abnormal results are displayed) Labs Reviewed  COMPREHENSIVE METABOLIC PANEL - Abnormal; Notable for the following components:      Result Value   Glucose, Bld 104 (*)    BUN 31 (*)    Creatinine, Ser 1.35 (*)    GFR, Estimated 38 (*)    All other components within normal limits  CBC WITH DIFFERENTIAL/PLATELET - Abnormal; Notable for the following components:   RBC 3.34 (*)    Hemoglobin 6.8 (*)    HCT 25.2 (*)    MCV 75.4 (*)    MCH 20.4 (*)    MCHC 27.0 (*)    RDW 19.2 (*)    All other components within normal limits  PROTIME-INR - Abnormal; Notable for the following components:   Prothrombin Time 16.7 (*)    INR 1.4 (*)    All other components within normal limits  RESP PANEL BY RT-PCR (FLU A&B, COVID) ARPGX2  POC OCCULT BLOOD, ED  TYPE AND SCREEN  PREPARE RBC (CROSSMATCH)    EKG None  Radiology No results found.  Procedures Procedures    Medications Ordered in ED Medications  0.9 %  sodium chloride infusion (has no administration in time range)    ED Course/ Medical Decision Making/ A&P                           Medical Decision Making Patient was sent here for evaluation of low hemoglobin found on screening for biannual exam.  She has not had visualized blood loss.  Problems Addressed: Bleeding: acute illness or injury    Details: Apparent acute wound but not, patient has not seen blood but there was blood during the examination today. Carbuncle: undiagnosed new problem with uncertain prognosis    Details: Unspecified wound of buttock, currently bleeding, not clear if it is a chronic wound or more acute process Iron deficiency anemia, unspecified iron deficiency anemia type: chronic illness or injury that poses a threat to life  or bodily functions    Details: Patient found with incidental anemia, and iron deficiency.  No visualized source of bleeding from rectum or respiratory systems.  Incidental  wound on buttock, currently bleeding.  Amount and/or Complexity of Data Reviewed Independent Historian:     Details: Patient gives cogent history External Data Reviewed: notes.    Details: Cardiology evaluation on 07/10/2021.  Stable ongoing problems including management of anticoagulation. Labs: ordered.    Details: CBC, metabolic panel, blood type-normal except BUN high, creatinine high, GFR low, hemoglobin low, MCV low.  Blood transfusion ordered Discussion of management or test interpretation with external provider(s): Discussion with hospitalist arrange for admission  Risk Decision regarding hospitalization. Risk Details: Patient at risk for decompensation since she is on Eliquis and has low blood count..  New wound of left buttock is currently bleeding.  I doubt that this is the source of her anemia.  Suspect occult intermittent GI bleed.  Blood transfusion ordered in the ED.  Hospitalist consulted to admit for observation.  Critical Care Total time providing critical care: 30-74 minutes          Final Clinical Impression(s) / ED Diagnoses Final diagnoses:  Iron deficiency anemia, unspecified iron deficiency anemia type  Bleeding  Carbuncle    Rx / DC Orders ED Discharge Orders     None         Daleen Bo, MD 10/13/21 1702

## 2021-10-14 ENCOUNTER — Observation Stay (HOSPITAL_COMMUNITY): Payer: PPO

## 2021-10-14 DIAGNOSIS — Z0389 Encounter for observation for other suspected diseases and conditions ruled out: Secondary | ICD-10-CM | POA: Diagnosis not present

## 2021-10-14 DIAGNOSIS — Z7901 Long term (current) use of anticoagulants: Secondary | ICD-10-CM | POA: Diagnosis not present

## 2021-10-14 DIAGNOSIS — D649 Anemia, unspecified: Secondary | ICD-10-CM | POA: Diagnosis not present

## 2021-10-14 LAB — BPAM RBC
Blood Product Expiration Date: 202302182359
Blood Product Expiration Date: 202302182359
Blood Product Expiration Date: 202302212359
Blood Product Expiration Date: 202302222359
ISSUE DATE / TIME: 202302141739
ISSUE DATE / TIME: 202302142207
ISSUE DATE / TIME: 202302142324
ISSUE DATE / TIME: 202302150108
Unit Type and Rh: 9500
Unit Type and Rh: 9500
Unit Type and Rh: 9500
Unit Type and Rh: 9500

## 2021-10-14 LAB — TYPE AND SCREEN
ABO/RH(D): O NEG
Antibody Screen: NEGATIVE
Unit division: 0
Unit division: 0
Unit division: 0
Unit division: 0

## 2021-10-14 LAB — CBC
HCT: 29.4 % — ABNORMAL LOW (ref 36.0–46.0)
Hemoglobin: 9.1 g/dL — ABNORMAL LOW (ref 12.0–15.0)
MCH: 23.3 pg — ABNORMAL LOW (ref 26.0–34.0)
MCHC: 31 g/dL (ref 30.0–36.0)
MCV: 75.4 fL — ABNORMAL LOW (ref 80.0–100.0)
Platelets: 265 10*3/uL (ref 150–400)
RBC: 3.9 MIL/uL (ref 3.87–5.11)
RDW: 18.5 % — ABNORMAL HIGH (ref 11.5–15.5)
WBC: 5.9 10*3/uL (ref 4.0–10.5)
nRBC: 0 % (ref 0.0–0.2)

## 2021-10-14 LAB — BASIC METABOLIC PANEL
Anion gap: 11 (ref 5–15)
BUN: 24 mg/dL — ABNORMAL HIGH (ref 8–23)
CO2: 22 mmol/L (ref 22–32)
Calcium: 8.7 mg/dL — ABNORMAL LOW (ref 8.9–10.3)
Chloride: 106 mmol/L (ref 98–111)
Creatinine, Ser: 0.92 mg/dL (ref 0.44–1.00)
GFR, Estimated: >60 mL/min (ref >60)
Glucose, Bld: 97 mg/dL (ref 70–99)
Potassium: 3.4 mmol/L — ABNORMAL LOW (ref 3.5–5.1)
Sodium: 139 mmol/L (ref 135–145)

## 2021-10-14 MED ORDER — PEG 3350-KCL-NA BICARB-NACL 420 G PO SOLR
4000.0000 mL | Freq: Once | ORAL | Status: AC
  Administered 2021-10-15: 4000 mL via ORAL
  Filled 2021-10-14: qty 4000

## 2021-10-14 MED ORDER — POTASSIUM CHLORIDE CRYS ER 20 MEQ PO TBCR
40.0000 meq | EXTENDED_RELEASE_TABLET | Freq: Once | ORAL | Status: AC
  Administered 2021-10-14: 40 meq via ORAL
  Filled 2021-10-14: qty 2

## 2021-10-14 NOTE — Consult Note (Addendum)
Referring Provider: Nolberto Hanlon, MD Primary Care Physician:  Shirline Frees, MD Primary Gastroenterologist:  Howie Ill (former patient of Dr. Amedeo Plenty)  Reason for Consultation:  Iron deficiency anemia  HPI: Amanda Berry is a 88 y.o. female  with medical history of PAF on Eliquis, HTN, HLD, remote history of iron deficiency anemia.On 10/13/21, she went to see PCP for 64-month follow-up, CBC showed new onset of anemia with hemoglobin 6.8 compared to 11 in November 2022. PCP sent her to the ED.  Patient denies any symptoms of shortness of breath, lightheadedness, or chest pains. On exam patient found to have a small ulcer between the skin folds of buttocks oozing blood.  Per patient, this is a chronic skin wound. Stool guaiac negative in ED.  2 units PRBCs ordered in the ED. After 2 units Hgb 9.1, MCV 75.4, BUN 24, Cr 0.92.  Patient states she has had shortness of breath on exertion intermittently since an acute illness in 2020.  Denies chest pain, nausea, vomiting, fever, chills, abdominal pain.  She has occasional acid reflux with certain food triggers but she avoids these and symptoms are rare, less than weekly.   Patient has a bowel movement daily of normal, brown, formed stool.  Denies melena or hematochezia.   She is on Eliquis for A-fib, last dose was yesterday morning 10/13/21. Denies use of NSAIDs, alcohol, smoking.   Patient has family history of colon cancer in her father and grandmother but she denies personal history of colon polyps.   Last colonoscopy 04/18/2013 with Dr. Amedeo Plenty - Diverticulosis of sigmoid and descending colon, internal hemorrhoids, otherwise normal.  Repeat not recommended for screening purposes.   Past Medical History:  Diagnosis Date   Constipation    Fracture of femur, right, closed ([place]) 2017   Hypertension    Overactive bladder    Symptomatic anemia 03/2016    Past Surgical History:  Procedure Laterality Date   KNEE ARTHROPLASTY Right    approx 10 years  ago   ORIF PERIPROSTHETIC FRACTURE Right 04/12/2016   Procedure: OPEN REDUCTION INTERNAL FIXATION (ORIF) RIGHT DISTAL FEMUR  PERIPROSTHETIC FRACTURE;  Surgeon: Paralee Cancel, MD;  Location: [facility];  Service: Orthopedics;  Laterality: Right;   ROTATOR CUFF REPAIR Right 1990's    Prior to Admission medications   Medication Sig Start Date End Date Taking? Authorizing Provider  acetaminophen (TYLENOL) 500 MG tablet Take 1,000 mg by mouth every 6 (six) hours as needed for mild pain.   Yes [provider]  alendronate (FOSAMAX) 70 MG tablet Take 70 mg by mouth every Sunday. 01/05/19  Yes [provider]  apixaban (ELIQUIS) 5 MG TABS tablet Take 1 tablet (5 mg total) by mouth 2 (two) times daily. 01/31/19  Yes Georgette Shell, MD  benzonatate (TESSALON) 100 MG capsule Take 100 mg by mouth 3 (three) times daily as needed. 09/17/21  Yes [provider]  calcium carbonate (OS-CAL) 600 MG TABS tablet Take 600 mg by mouth daily with breakfast.   Yes [provider]  chlorthalidone (HYGROTON) 25 MG tablet Take 1 tablet (25 mg total) by mouth daily. 01/21/21 10/13/21 Yes Lelon Perla, MD  Cholecalciferol (VITAMIN D3 PO) Take by mouth daily.   Yes [provider]  diltiazem (CARDIZEM CD) 240 MG 24 hr capsule Take 1 capsule by mouth once daily 09/24/21  Yes Crenshaw, Denice Bors, MD  famotidine (PEPCID) 20 MG tablet Take 1 tablet (20 mg total) by mouth daily. 02/01/19  Yes Georgette Shell,  MD  losartan (COZAAR) 50 MG tablet Take 1 tablet (50 mg total) by mouth daily. 02/03/21 10/13/21 Yes Lelon Perla, MD  Menthol, Topical Analgesic, (BIOFREEZE EX) Apply 1 application topically as needed (Arthritis pain).   Yes [provider]    Scheduled Meds:  diltiazem  60 mg Oral Q6H   famotidine  20 mg Oral Daily   pantoprazole  40 mg Oral Daily   Continuous Infusions: PRN Meds:.acetaminophen  Allergies as of 10/13/2021   (No Known Allergies)    Family  History  Problem Relation Age of Onset   Hypertension Mother    Colon cancer Father    Valvular heart disease Sister    Kidney disease Brother    Alcoholism Brother    Lung cancer Sister     Social History   Socioeconomic History   Marital status: Married    Spouse name: Not on file   Number of children: 3   Years of education: Not on file   Highest education level: Not on file  Occupational History   Not on file  Tobacco Use   Smoking status: Never   Smokeless tobacco: Never  Substance and Sexual Activity   Alcohol use: No   Drug use: No   Sexual activity: Not on file  Other Topics Concern   Not on file  Social History Narrative   Not on file   Social Determinants of Health   Financial Resource Strain: Not on file  Food Insecurity: Not on file  Transportation Needs: Not on file  Physical Activity: Not on file  Stress: Not on file  Social Connections: Not on file  Intimate Partner Violence: Not on file    Review of Systems: Review of Systems  Constitutional:  Negative for chills and fever.  HENT:  Negative for sore throat.   Respiratory:  Positive for shortness of breath (with exertion, ongoing, intermittent). Negative for hemoptysis and stridor.   Cardiovascular:  Negative for chest pain and leg swelling.  Gastrointestinal:  Negative for abdominal pain, blood in stool, constipation, diarrhea, heartburn, melena, nausea and vomiting.  Genitourinary:  Negative for dysuria and urgency.  Musculoskeletal:  Negative for falls and myalgias.  Skin:  Negative for itching and rash.  Neurological:  Positive for headaches. Negative for dizziness, seizures and loss of consciousness.  Psychiatric/Behavioral:  Negative for memory loss. The patient is not nervous/anxious.     Physical Exam: Physical Exam Constitutional:      General: She is not in acute distress. HENT:     Head: Normocephalic and atraumatic.     Right Ear: External ear normal.     Left Ear: External ear  normal.     Nose: Nose normal.     Mouth/Throat:     Mouth: Mucous membranes are moist.     Pharynx: Oropharynx is clear.  Eyes:     Extraocular Movements: Extraocular movements intact.     Comments: Conjunctival pallor  Cardiovascular:     Rate and Rhythm: Normal rate and regular rhythm.     Pulses: Normal pulses.     Heart sounds: Normal heart sounds.  Pulmonary:     Effort: Pulmonary effort is normal.     Breath sounds: Normal breath sounds.  Abdominal:     General: Bowel sounds are normal. There is no distension.     Palpations: Abdomen is soft.     Tenderness: There is no abdominal tenderness.  Musculoskeletal:     Cervical back: Normal range of motion  and neck supple.     Right lower leg: No edema.     Left lower leg: No edema.  Skin:    General: Skin is warm and dry.  Neurological:     General: No focal deficit present.     Mental Status: She is alert and oriented to person, place, and time.  Psychiatric:        Mood and Affect: Mood normal.        Behavior: Behavior normal.      Vital signs: Vitals:   10/14/21 1000 10/14/21 1100  BP: (!) 124/55 (!) 134/55  Pulse: 68 63  Resp: 15 17  Temp:    SpO2: 95% 94%        GI:  Lab Results: Recent Labs    10/13/21 1502 10/14/21 0324  WBC 5.8 5.9  HGB 6.8* 9.1*  HCT 25.2* 29.4*  PLT 349 265   BMET Recent Labs    10/13/21 1502 10/14/21 0324  NA 136 139  K 3.7 3.4*  CL 101 106  CO2 24 22  GLUCOSE 104* 97  BUN 31* 24*  CREATININE 1.35* 0.92  CALCIUM 9.6 8.7*   LFT Recent Labs    10/13/21 1502  PROT 6.9  ALBUMIN 3.6  AST 19  ALT 13  ALKPHOS 54  BILITOT 0.4   PT/INR Recent Labs    10/13/21 1502  LABPROT 16.7*  INR 1.4*     Studies/Results: No results found.  Impression: Iron deficiency anemia - Hgb 6.8 on arrival to ED, now 9.1 after 2 units PRBCs - MCV 75.4, platelets 265, BUN 24, creatinine 0.92 - Last colonoscopy 2014, no record of prior EGD - No history of GI bleed -  Started on oral Protonix 40mg  twice daily in ED  Paroxysmal A. Fib - On Eliquis, last dose morning of 10/13/21 - INR 1.4  Friction/shear injury of right inner buttocks - Dressing placed   Plan: Plan for EGD/Colonoscopy Friday 2/17. I thoroughly discussed the procedures to include nature, alternatives, benefits, and risks including but not limited to bleeding, perforation, infection, anesthesia/cardiac and pulmonary complications. Patient provides understanding and gave verbal consent to proceed. Start Protonix 40 mg IV BID. Nulytely prep and clear liquid diet starting tomorrow, NPO at midnight 2/17. Continue daily CBC with transfusion as needed to maintain Hgb >7.  Suggestion of early cirrhosis on RUQ [place] 12/2018. Will order repeat RUQ [place]. [facility] will follow.     LOS: 0 days   Angelique Holm  PA-C 10/14/2021, 11:56 AM  Contact #  [phone]

## 2021-10-14 NOTE — ED Notes (Signed)
Breakfast Orders Placed °

## 2021-10-14 NOTE — ED Notes (Signed)
Pt sitting up on edge of bed eating breakfst. VSS, no distress noted, denies needs, call light in reach.

## 2021-10-14 NOTE — Progress Notes (Signed)
PROGRESS NOTE    Amanda Berry  POE:[id number] DOB: [birth year] DOA: 10/13/2021 PCP: Shirline Frees, MD    Brief Narrative:  Amanda Berry is a 88 y.o. female with medical history significant of PAF on Eliquis, HTN, HLD, remote history of iron deficiency anemia, was sent from PCPs office for evaluation of new onset of anemia.   Hg 6.8. s/p prbc x2    Consultants:  GI  Procedures:   Antimicrobials:      Subjective: Feels tired, no sob. No cp  Objective: Vitals:   10/14/21 1541 10/14/21 1549 10/14/21 1613 10/14/21 2000  BP:   (!) 121/50 (!) 123/48  Pulse:   82 91  Resp:   17 17  Temp:   98.2 F (36.8 C) 97.9 F (36.6 C)  TempSrc:    Oral  SpO2:   95% 97%  Weight:  72.7 kg    Height: 5' (1.524 m)       Intake/Output Summary (Last 24 hours) at 10/14/2021 2008 Last data filed at 10/14/2021 1834 Gross per 24 hour  Intake 160 ml  Output 1 ml  Net 159 ml   Filed Weights   10/14/21 1549  Weight: 72.7 kg    Examination: Calm, NAD Cta no w/r Reg s1/s2 no gallop Soft benign +bs No edema Aaoxox3  Mood and affect appropriate in current setting     Data Reviewed: I have personally reviewed following labs and imaging studies  CBC: Recent Labs  Lab 10/13/21 1502 10/14/21 0324  WBC 5.8 5.9  NEUTROABS 3.3  --   HGB 6.8* 9.1*  HCT 25.2* 29.4*  MCV 75.4* 75.4*  PLT 349 315   Basic Metabolic Panel: Recent Labs  Lab 10/13/21 1502 10/14/21 0324  NA 136 139  K 3.7 3.4*  CL 101 106  CO2 24 22  GLUCOSE 104* 97  BUN 31* 24*  CREATININE 1.35* 0.92  CALCIUM 9.6 8.7*   GFR: Estimated Creatinine Clearance: 39.1 mL/min (by C-G formula based on SCr of 0.92 mg/dL). Liver Function Tests: Recent Labs  Lab 10/13/21 1502  AST 19  ALT 13  ALKPHOS 54  BILITOT 0.4  PROT 6.9  ALBUMIN 3.6   No results for input(s): LIPASE, AMYLASE in the last 168 hours. No results for input(s): AMMONIA in the last 168 hours. Coagulation Profile: Recent Labs  Lab  10/13/21 1502  INR 1.4*   Cardiac Enzymes: No results for input(s): CKTOTAL, CKMB, CKMBINDEX, TROPONINI in the last 168 hours. BNP (last 3 results) No results for input(s): PROBNP in the last 8760 hours. HbA1C: No results for input(s): HGBA1C in the last 72 hours. CBG: No results for input(s): GLUCAP in the last 168 hours. Lipid Profile: No results for input(s): CHOL, HDL, LDLCALC, TRIG, CHOLHDL, LDLDIRECT in the last 72 hours. Thyroid Function Tests: No results for input(s): TSH, T4TOTAL, FREET4, T3FREE, THYROIDAB in the last 72 hours. Anemia Panel: No results for input(s): VITAMINB12, FOLATE, FERRITIN, TIBC, IRON, RETICCTPCT in the last 72 hours. Sepsis Labs: No results for input(s): PROCALCITON, LATICACIDVEN in the last 168 hours.  Recent Results (from the past 240 hour(s))  Resp Panel by RT-PCR (Flu A&B, Covid) Nasopharyngeal Swab     Status: None   Collection Time: 10/13/21  2:55 PM   Specimen: Nasopharyngeal Swab; Nasopharyngeal(NP) swabs in vial transport medium  Result Value Ref Range Status   SARS Coronavirus 2 by RT PCR NEGATIVE NEGATIVE Final    Comment: (NOTE) SARS-CoV-2 target nucleic acids are NOT DETECTED.  The  SARS-CoV-2 RNA is generally detectable in upper respiratory specimens during the acute phase of infection. The lowest concentration of SARS-CoV-2 viral copies this assay can detect is 138 copies/mL. A negative result does not preclude SARS-Cov-2 infection and should not be used as the sole basis for treatment or other patient management decisions. A negative result may occur with  improper specimen collection/handling, submission of specimen other than nasopharyngeal swab, presence of viral mutation(s) within the areas targeted by this assay, and inadequate number of viral copies(<138 copies/mL). A negative result must be combined with clinical observations, patient history, and epidemiological information. The expected result is Negative.  Fact Sheet  for Patients:  EntrepreneurPulse.com.au  Fact Sheet for Healthcare Providers:  IncredibleEmployment.be  This test is no t yet approved or cleared by the [place] FDA and  has been authorized for detection and/or diagnosis of SARS-CoV-2 by FDA under an Emergency Use Authorization (EUA). This EUA will remain  in effect (meaning this test can be used) for the duration of the COVID-19 declaration under Section 564(b)(1) of the Act, 21 U.S.C.section 360bbb-3(b)(1), unless the authorization is terminated  or revoked sooner.       Influenza A by PCR NEGATIVE NEGATIVE Final   Influenza B by PCR NEGATIVE NEGATIVE Final    Comment: (NOTE) The Xpert Xpress SARS-CoV-2/FLU/RSV plus assay is intended as an aid in the diagnosis of influenza from Nasopharyngeal swab specimens and should not be used as a sole basis for treatment. Nasal washings and aspirates are unacceptable for Xpert Xpress SARS-CoV-2/FLU/RSV testing.  Fact Sheet for Patients: EntrepreneurPulse.com.au  Fact Sheet for Healthcare Providers: IncredibleEmployment.be  This test is not yet approved or cleared by the [place] FDA and has been authorized for detection and/or diagnosis of SARS-CoV-2 by FDA under an Emergency Use Authorization (EUA). This EUA will remain in effect (meaning this test can be used) for the duration of the COVID-19 declaration under Section 564(b)(1) of the Act, 21 U.S.C. section 360bbb-3(b)(1), unless the authorization is terminated or revoked.  Performed at [facility], [place] [address] [place], [place] [address]          Radiology Studies: No results found.      Scheduled Meds:  diltiazem  60 mg Oral Q6H   famotidine  20 mg Oral Daily   pantoprazole  40 mg Oral Daily   [START ON 10/15/2021] polyethylene glycol-electrolytes  4,000 mL Oral Once   Continuous Infusions:  Assessment & Plan:    Principal Problem:   Anemia Active Problems:   Symptomatic anemia  Acute on chronic iron deficiency anemia FOB neg S/p prbc  2 u. Gi consulted Holding eliquis Plan for EGD/Cscope on friday   Stage I pressure ulcer on buttocks -Between skin folds 2/15 wound care nsg   HTN stable   PAF -In sinu. 2/15 long acting cardizem changed to short acting for now. Holding a/c      DVT prophylaxis: scd Code Status:full Family Communication: family at bedside Disposition Plan: likely dc home Status is: Observation The patient remains OBS appropriate and will d/c before 2 midnights.              LOS: 0 days   Time spent: 35 min    Nolberto Hanlon, MD [facility] Pager [phone]  If 7PM-7AM, please contact night-coverage 10/14/2021, 8:08 PM

## 2021-10-14 NOTE — Consult Note (Addendum)
[facility] Nurse Consult Note: Reason for Consult: Consult requested for buttocks.  Right inner buttocks with red moist full thickness lesion to inner gluteal fold, near rectum.  Location and appearance are consistent with probable friction/shear, NOT a pressure injury. 1X1X.1cm, small amt bloody drainage when cleansed.  Dressing procedure/placement/frequency: Topical treatment orders provided for bedside nurses to perform as follows to protect from further injury: Foam dressing to right inner buttock.  Change Q 3 days or PRN soiling. Please re-consult if further assistance is needed.  Thank-you,  Julien Girt MSN, [place], [place], [place], [place]

## 2021-10-15 DIAGNOSIS — N3281 Overactive bladder: Secondary | ICD-10-CM | POA: Diagnosis present

## 2021-10-15 DIAGNOSIS — I480 Paroxysmal atrial fibrillation: Secondary | ICD-10-CM | POA: Diagnosis present

## 2021-10-15 DIAGNOSIS — K922 Gastrointestinal hemorrhage, unspecified: Secondary | ICD-10-CM

## 2021-10-15 DIAGNOSIS — K648 Other hemorrhoids: Secondary | ICD-10-CM | POA: Diagnosis present

## 2021-10-15 DIAGNOSIS — K2980 Duodenitis without bleeding: Secondary | ICD-10-CM | POA: Diagnosis present

## 2021-10-15 DIAGNOSIS — Z8249 Family history of ischemic heart disease and other diseases of the circulatory system: Secondary | ICD-10-CM | POA: Diagnosis not present

## 2021-10-15 DIAGNOSIS — K7460 Unspecified cirrhosis of liver: Secondary | ICD-10-CM | POA: Diagnosis present

## 2021-10-15 DIAGNOSIS — K2100 Gastro-esophageal reflux disease with esophagitis, without bleeding: Secondary | ICD-10-CM | POA: Diagnosis not present

## 2021-10-15 DIAGNOSIS — Z801 Family history of malignant neoplasm of trachea, bronchus and lung: Secondary | ICD-10-CM | POA: Diagnosis not present

## 2021-10-15 DIAGNOSIS — L89311 Pressure ulcer of right buttock, stage 1: Secondary | ICD-10-CM | POA: Diagnosis present

## 2021-10-15 DIAGNOSIS — Z6372 Alcoholism and drug addiction in family: Secondary | ICD-10-CM | POA: Diagnosis not present

## 2021-10-15 DIAGNOSIS — I10 Essential (primary) hypertension: Secondary | ICD-10-CM | POA: Diagnosis present

## 2021-10-15 DIAGNOSIS — Z79899 Other long term (current) drug therapy: Secondary | ICD-10-CM | POA: Diagnosis not present

## 2021-10-15 DIAGNOSIS — K2970 Gastritis, unspecified, without bleeding: Secondary | ICD-10-CM | POA: Diagnosis present

## 2021-10-15 DIAGNOSIS — D649 Anemia, unspecified: Secondary | ICD-10-CM | POA: Diagnosis present

## 2021-10-15 DIAGNOSIS — Z800 Family history of malignant neoplasm of digestive organs: Secondary | ICD-10-CM | POA: Diagnosis not present

## 2021-10-15 DIAGNOSIS — Z841 Family history of disorders of kidney and ureter: Secondary | ICD-10-CM | POA: Diagnosis not present

## 2021-10-15 DIAGNOSIS — E785 Hyperlipidemia, unspecified: Secondary | ICD-10-CM | POA: Diagnosis present

## 2021-10-15 DIAGNOSIS — K635 Polyp of colon: Secondary | ICD-10-CM | POA: Diagnosis not present

## 2021-10-15 DIAGNOSIS — Z811 Family history of alcohol abuse and dependence: Secondary | ICD-10-CM | POA: Diagnosis not present

## 2021-10-15 DIAGNOSIS — K5730 Diverticulosis of large intestine without perforation or abscess without bleeding: Secondary | ICD-10-CM | POA: Diagnosis present

## 2021-10-15 DIAGNOSIS — K2990 Gastroduodenitis, unspecified, without bleeding: Secondary | ICD-10-CM | POA: Diagnosis not present

## 2021-10-15 DIAGNOSIS — Z20822 Contact with and (suspected) exposure to covid-19: Secondary | ICD-10-CM | POA: Diagnosis present

## 2021-10-15 DIAGNOSIS — L0293 Carbuncle, unspecified: Secondary | ICD-10-CM | POA: Diagnosis present

## 2021-10-15 DIAGNOSIS — Z7983 Long term (current) use of bisphosphonates: Secondary | ICD-10-CM | POA: Diagnosis not present

## 2021-10-15 DIAGNOSIS — D509 Iron deficiency anemia, unspecified: Secondary | ICD-10-CM | POA: Diagnosis not present

## 2021-10-15 DIAGNOSIS — Z7901 Long term (current) use of anticoagulants: Secondary | ICD-10-CM | POA: Diagnosis not present

## 2021-10-15 LAB — CBC
HCT: 30.4 % — ABNORMAL LOW (ref 36.0–46.0)
Hemoglobin: 9.2 g/dL — ABNORMAL LOW (ref 12.0–15.0)
MCH: 23.1 pg — ABNORMAL LOW (ref 26.0–34.0)
MCHC: 30.3 g/dL (ref 30.0–36.0)
MCV: 76.2 fL — ABNORMAL LOW (ref 80.0–100.0)
Platelets: 265 10*3/uL (ref 150–400)
RBC: 3.99 MIL/uL (ref 3.87–5.11)
RDW: 19.2 % — ABNORMAL HIGH (ref 11.5–15.5)
WBC: 7.7 10*3/uL (ref 4.0–10.5)
nRBC: 0 % (ref 0.0–0.2)

## 2021-10-15 LAB — POTASSIUM: Potassium: 4.4 mmol/L (ref 3.5–5.1)

## 2021-10-15 NOTE — Progress Notes (Signed)
[facility] Gastroenterology Progress Note  Amanda Berry 88 y.o. 10/27/1933  CC: Symptomatic anemia   Subjective: Patient seen and examined at bedside.  Feeling better.  Tolerating clears.  Denies any GI symptoms  ROS : Afebrile.  Negative for chest pain   Objective: Vital signs in last 24 hours: Vitals:   10/15/21 0305 10/15/21 0437  BP: 116/88 137/75  Pulse: 92 88  Resp: 18 16  Temp: 98.4 F (36.9 C) 98.7 F (37.1 C)  SpO2: 95% 97%    Physical Exam:  General : Elderly patient, not in acute distress Abdomen : Soft, nontender, nondistended, bowel sounds present.  No peritoneal sign Lower extremity : No edema Neuro : Alert and oriented x3 Psych : Mood and affect normal  Lab Results: Recent Labs    10/13/21 1502 10/14/21 0324 10/15/21 0146  NA 136 139  --   K 3.7 3.4* 4.4  CL 101 106  --   CO2 24 22  --   GLUCOSE 104* 97  --   BUN 31* 24*  --   CREATININE 1.35* 0.92  --   CALCIUM 9.6 8.7*  --    Recent Labs    10/13/21 1502  AST 19  ALT 13  ALKPHOS 54  BILITOT 0.4  PROT 6.9  ALBUMIN 3.6   Recent Labs    10/13/21 1502 10/14/21 0324 10/15/21 0146  WBC 5.8 5.9 7.7  NEUTROABS 3.3  --   --   HGB 6.8* 9.1* 9.2*  HCT 25.2* 29.4* 30.4*  MCV 75.4* 75.4* 76.2*  PLT 349 265 265   Recent Labs    10/13/21 1502  LABPROT 16.7*  INR 1.4*      Assessment/Plan: Assessment ----------------- -Symptomatic anemia.  No overt bleeding.  FOBT negative. -Questionable cirrhosis per ultrasound in 2020.  Patient has normal platelet count. -History of atrial fibrillation.  Last dose of Eliquis 02/14    Recommendations ------------------------- -Ultrasound showed no obvious cirrhosis but questionable nodular counter which was again seen in 2020.   -Plan for EGD and colonoscopy tomorrow. -Clear liquid diet today.  Keep n.p.o. past midnight.  Start colonoscopy prep in the afternoon.   Risks (bleeding, infection, bowel perforation that could require surgery,  sedation-related changes in cardiopulmonary systems), benefits (identification and possible treatment of source of symptoms, exclusion of certain causes of symptoms), and alternatives (watchful waiting, radiographic imaging studies, empiric medical treatment)  were explained to patient/family in detail and patient wishes to proceed.    Otis Brace MD, [place] 10/15/2021, 9:29 AM  Contact #  [phone]

## 2021-10-15 NOTE — H&P (View-Only) (Signed)
[facility] Gastroenterology Progress Note  Amanda Berry 88 y.o. [birth year]  CC: Symptomatic anemia   Subjective: Patient seen and examined at bedside.  Feeling better.  Tolerating clears.  Denies any GI symptoms  ROS : Afebrile.  Negative for chest pain   Objective: Vital signs in last 24 hours: Vitals:   10/15/21 0305 10/15/21 0437  BP: 116/88 137/75  Pulse: 92 88  Resp: 18 16  Temp: 98.4 F (36.9 C) 98.7 F (37.1 C)  SpO2: 95% 97%    Physical Exam:  General : Elderly patient, not in acute distress Abdomen : Soft, nontender, nondistended, bowel sounds present.  No peritoneal sign Lower extremity : No edema Neuro : Alert and oriented x3 Psych : Mood and affect normal  Lab Results: Recent Labs    10/13/21 1502 10/14/21 0324 10/15/21 0146  NA 136 139  --   K 3.7 3.4* 4.4  CL 101 106  --   CO2 24 22  --   GLUCOSE 104* 97  --   BUN 31* 24*  --   CREATININE 1.35* 0.92  --   CALCIUM 9.6 8.7*  --    Recent Labs    10/13/21 1502  AST 19  ALT 13  ALKPHOS 54  BILITOT 0.4  PROT 6.9  ALBUMIN 3.6   Recent Labs    10/13/21 1502 10/14/21 0324 10/15/21 0146  WBC 5.8 5.9 7.7  NEUTROABS 3.3  --   --   HGB 6.8* 9.1* 9.2*  HCT 25.2* 29.4* 30.4*  MCV 75.4* 75.4* 76.2*  PLT 349 265 265   Recent Labs    10/13/21 1502  LABPROT 16.7*  INR 1.4*      Assessment/Plan: Assessment ----------------- -Symptomatic anemia.  No overt bleeding.  FOBT negative. -Questionable cirrhosis per ultrasound in 2020.  Patient has normal platelet count. -History of atrial fibrillation.  Last dose of Eliquis 02/14    Recommendations ------------------------- -Ultrasound showed no obvious cirrhosis but questionable nodular counter which was again seen in 2020.   -Plan for EGD and colonoscopy tomorrow. -Clear liquid diet today.  Keep n.p.o. past midnight.  Start colonoscopy prep in the afternoon.   Risks (bleeding, infection, bowel perforation that could require surgery,  sedation-related changes in cardiopulmonary systems), benefits (identification and possible treatment of source of symptoms, exclusion of certain causes of symptoms), and alternatives (watchful waiting, radiographic imaging studies, empiric medical treatment)  were explained to patient/family in detail and patient wishes to proceed.    Otis Brace MD, [place] 10/15/2021, 9:29 AM  Contact #  [phone]

## 2021-10-15 NOTE — Progress Notes (Signed)
PROGRESS NOTE    Amanda Berry  IRW:[id number] DOB: 06/26/1934 DOA: 10/13/2021 PCP: Shirline Frees, MD    Brief Narrative:  Amanda Berry is a 88 y.o. female with medical history significant of PAF on Eliquis, HTN, HLD, remote history of iron deficiency anemia, was sent from PCPs office for evaluation of new onset of anemia.   Hg 6.8. s/p prbc x2  2/16 H&H stable.  On the commode.  Has no complaints   Consultants:  GI  Procedures:   Antimicrobials:      Subjective: Denies shortness of breath or chest pain. Was a bowel movement since taking GoLytely  Objective: Vitals:   10/14/21 2000 10/15/21 0305 10/15/21 0437 10/15/21 1140  BP: (!) 123/48 116/88 137/75 (!) 149/68  Pulse: 91 92 88 81  Resp: 17 18 16 17   Temp: 97.9 F (36.6 C) 98.4 F (36.9 C) 98.7 F (37.1 C) 99.1 F (37.3 C)  TempSrc: Oral Oral Oral Oral  SpO2: 97% 95% 97% 99%  Weight:      Height:        Intake/Output Summary (Last 24 hours) at 10/15/2021 1603 Last data filed at 10/15/2021 0510 Gross per 24 hour  Intake 400 ml  Output 1 ml  Net 399 ml   Filed Weights   10/14/21 1549  Weight: 72.7 kg    Examination: Calm, NAD Cta no w/r Reg s1/s2 no gallop Soft benign +bs No edema Aaoxox3  Mood and affect appropriate in current setting     Data Reviewed: I have personally reviewed following labs and imaging studies  CBC: Recent Labs  Lab 10/13/21 1502 10/14/21 0324 10/15/21 0146  WBC 5.8 5.9 7.7  NEUTROABS 3.3  --   --   HGB 6.8* 9.1* 9.2*  HCT 25.2* 29.4* 30.4*  MCV 75.4* 75.4* 76.2*  PLT 349 265 761   Basic Metabolic Panel: Recent Labs  Lab 10/13/21 1502 10/14/21 0324 10/15/21 0146  NA 136 139  --   K 3.7 3.4* 4.4  CL 101 106  --   CO2 24 22  --   GLUCOSE 104* 97  --   BUN 31* 24*  --   CREATININE 1.35* 0.92  --   CALCIUM 9.6 8.7*  --    GFR: Estimated Creatinine Clearance: 39.1 mL/min (by C-G formula based on SCr of 0.92 mg/dL). Liver Function Tests: Recent Labs   Lab 10/13/21 1502  AST 19  ALT 13  ALKPHOS 54  BILITOT 0.4  PROT 6.9  ALBUMIN 3.6   No results for input(s): LIPASE, AMYLASE in the last 168 hours. No results for input(s): AMMONIA in the last 168 hours. Coagulation Profile: Recent Labs  Lab 10/13/21 1502  INR 1.4*   Cardiac Enzymes: No results for input(s): CKTOTAL, CKMB, CKMBINDEX, TROPONINI in the last 168 hours. BNP (last 3 results) No results for input(s): PROBNP in the last 8760 hours. HbA1C: No results for input(s): HGBA1C in the last 72 hours. CBG: No results for input(s): GLUCAP in the last 168 hours. Lipid Profile: No results for input(s): CHOL, HDL, LDLCALC, TRIG, CHOLHDL, LDLDIRECT in the last 72 hours. Thyroid Function Tests: No results for input(s): TSH, T4TOTAL, FREET4, T3FREE, THYROIDAB in the last 72 hours. Anemia Panel: No results for input(s): VITAMINB12, FOLATE, FERRITIN, TIBC, IRON, RETICCTPCT in the last 72 hours. Sepsis Labs: No results for input(s): PROCALCITON, LATICACIDVEN in the last 168 hours.  Recent Results (from the past 240 hour(s))  Resp Panel by RT-PCR (Flu A&B, Covid) Nasopharyngeal Swab  Status: None   Collection Time: 10/13/21  2:55 PM   Specimen: Nasopharyngeal Swab; Nasopharyngeal(NP) swabs in vial transport medium  Result Value Ref Range Status   SARS Coronavirus 2 by RT PCR NEGATIVE NEGATIVE Final    Comment: (NOTE) SARS-CoV-2 target nucleic acids are NOT DETECTED.  The SARS-CoV-2 RNA is generally detectable in upper respiratory specimens during the acute phase of infection. The lowest concentration of SARS-CoV-2 viral copies this assay can detect is 138 copies/mL. A negative result does not preclude SARS-Cov-2 infection and should not be used as the sole basis for treatment or other patient management decisions. A negative result may occur with  improper specimen collection/handling, submission of specimen other than nasopharyngeal swab, presence of viral mutation(s)  within the areas targeted by this assay, and inadequate number of viral copies(<138 copies/mL). A negative result must be combined with clinical observations, patient history, and epidemiological information. The expected result is Negative.  Fact Sheet for Patients:  EntrepreneurPulse.com.au  Fact Sheet for Healthcare Providers:  IncredibleEmployment.be  This test is no t yet approved or cleared by the [place] FDA and  has been authorized for detection and/or diagnosis of SARS-CoV-2 by FDA under an Emergency Use Authorization (EUA). This EUA will remain  in effect (meaning this test can be used) for the duration of the COVID-19 declaration under Section 564(b)(1) of the Act, 21 U.S.C.section 360bbb-3(b)(1), unless the authorization is terminated  or revoked sooner.       Influenza A by PCR NEGATIVE NEGATIVE Final   Influenza B by PCR NEGATIVE NEGATIVE Final    Comment: (NOTE) The Xpert Xpress SARS-CoV-2/FLU/RSV plus assay is intended as an aid in the diagnosis of influenza from Nasopharyngeal swab specimens and should not be used as a sole basis for treatment. Nasal washings and aspirates are unacceptable for Xpert Xpress SARS-CoV-2/FLU/RSV testing.  Fact Sheet for Patients: EntrepreneurPulse.com.au  Fact Sheet for Healthcare Providers: IncredibleEmployment.be  This test is not yet approved or cleared by the [place] FDA and has been authorized for detection and/or diagnosis of SARS-CoV-2 by FDA under an Emergency Use Authorization (EUA). This EUA will remain in effect (meaning this test can be used) for the duration of the COVID-19 declaration under Section 564(b)(1) of the Act, 21 U.S.C. section 360bbb-3(b)(1), unless the authorization is terminated or revoked.  Performed at [facility], [place] [address] [place], [place] [address]          Radiology Studies: US Abdomen  Limited RUQ (LIVER/GB)  Result Date: 10/14/2021 CLINICAL DATA:  Abnormal ultrasound, cirrhotic changes on prior ultrasound EXAM: ULTRASOUND ABDOMEN LIMITED RIGHT UPPER QUADRANT COMPARISON:  Ultrasound abdomen 01/28/2019 FINDINGS: Gallbladder: No gallstones or wall thickening visualized. No sonographic Murphy sign noted by sonographer. Common bile duct: Diameter: 4 mm. Liver: Query vague nodular hepatic contour. No focal lesion identified. Within normal limits in parenchymal echogenicity. Portal vein is patent on color Doppler imaging with normal direction of blood flow towards the liver. Other: None. IMPRESSION: 1. Query vague nodular hepatic contour. 2. Otherwise unremarkable right upper quadrant ultrasound. Electronically Signed   By: Iven Finn M.D.   On: 10/14/2021 21:27        Scheduled Meds:  diltiazem  60 mg Oral Q6H   famotidine  20 mg Oral Daily   pantoprazole  40 mg Oral Daily   Continuous Infusions:  Assessment & Plan:   Principal Problem:   Anemia Active Problems:   Symptomatic anemia  Acute on chronic iron deficiency anemia FOB neg  S/p prbc  2 u. Gi consulted Holding eliquis 2/16 EGD colonoscopy in a.m.  Keep n.p.o. at midnight tonight     Stage I pressure ulcer on buttocks -Between skin folds 2/16 wound care nursing   HTN Stable   PAF -In sinus Continue Cardizem continue to hold Eliquis       DVT prophylaxis: scd Code Status:full Family Communication: None at bedside Disposition Plan: likely dc home Status is: Inpatient Patient remains inpatient as she requires colonoscopy and EGD in a.m.              LOS: 0 days   Time spent: 35 min    Nolberto Hanlon, MD [facility] Pager [phone]  If 7PM-7AM, please contact night-coverage 10/15/2021, 4:03 PM

## 2021-10-15 NOTE — Anesthesia Preprocedure Evaluation (Addendum)
Anesthesia Evaluation  Patient identified by MRN, date of birth, ID band Patient awake    Reviewed: Allergy & Precautions, NPO status , Patient's Chart, lab work & pertinent test results  Airway Mallampati: II  TM Distance: >3 FB Neck ROM: Full    Dental  (+) Dental Advisory Given, Upper Dentures, Edentulous Lower   Pulmonary neg pulmonary ROS,    Pulmonary exam normal breath sounds clear to auscultation       Cardiovascular hypertension, Pt. on medications Normal cardiovascular exam Rhythm:Regular Rate:Normal     Neuro/Psych negative neurological ROS  negative psych ROS   GI/Hepatic Neg liver ROS, GERD  Medicated,  Endo/Other  negative endocrine ROSObesity   Renal/GU Renal disease (AKI)     Musculoskeletal negative musculoskeletal ROS (+)   Abdominal   Peds  Hematology  (+) Blood dyscrasia (Eliquis), anemia ,   Anesthesia Other Findings   Reproductive/Obstetrics                           Anesthesia Physical Anesthesia Plan  ASA: 3  Anesthesia Plan: MAC   Post-op Pain Management:    Induction: Intravenous  PONV Risk Score and Plan: 2 and TIVA and Treatment may vary due to age or medical condition  Airway Management Planned: Natural Airway and Nasal Cannula  Additional Equipment:   Intra-op Plan:   Post-operative Plan:   Informed Consent: I have reviewed the patients History and Physical, chart, labs and discussed the procedure including the risks, benefits and alternatives for the proposed anesthesia with the patient or authorized representative who has indicated his/her understanding and acceptance.     Dental advisory given  Plan Discussed with: CRNA  Anesthesia Plan Comments:        Anesthesia Quick Evaluation

## 2021-10-15 NOTE — Plan of Care (Signed)
°  Problem: Activity: °Goal: Risk for activity intolerance will decrease °Outcome: Progressing °  °Problem: Elimination: °Goal: Will not experience complications related to bowel motility °Outcome: Progressing °Goal: Will not experience complications related to urinary retention °Outcome: Progressing °  °

## 2021-10-16 ENCOUNTER — Encounter (HOSPITAL_COMMUNITY)
Admission: EM | Disposition: A | Discharge status: Home / Self Care | Payer: Self-pay | Source: Home / Self Care | Attending: Internal Medicine

## 2021-10-16 ENCOUNTER — Inpatient Hospital Stay (HOSPITAL_COMMUNITY): Payer: PPO | Admitting: Anesthesiology

## 2021-10-16 DIAGNOSIS — D509 Iron deficiency anemia, unspecified: Secondary | ICD-10-CM

## 2021-10-16 DIAGNOSIS — K635 Polyp of colon: Secondary | ICD-10-CM

## 2021-10-16 DIAGNOSIS — K2100 Gastro-esophageal reflux disease with esophagitis, without bleeding: Secondary | ICD-10-CM

## 2021-10-16 DIAGNOSIS — K2990 Gastroduodenitis, unspecified, without bleeding: Secondary | ICD-10-CM

## 2021-10-16 HISTORY — PX: HEMOSTASIS CLIP PLACEMENT: SHX6857

## 2021-10-16 HISTORY — PX: COLONOSCOPY: SHX5424

## 2021-10-16 HISTORY — PX: ESOPHAGOGASTRODUODENOSCOPY: SHX5428

## 2021-10-16 HISTORY — PX: POLYPECTOMY: SHX5525

## 2021-10-16 SURGERY — COLONOSCOPY
Anesthesia: Monitor Anesthesia Care

## 2021-10-16 MED ORDER — PROPOFOL 500 MG/50ML IV EMUL
INTRAVENOUS | Status: DC | PRN
  Administered 2021-10-16: 150 ug/kg/min via INTRAVENOUS

## 2021-10-16 MED ORDER — PANTOPRAZOLE SODIUM 40 MG PO TBEC: 40.0000 mg | DELAYED_RELEASE_TABLET | Freq: Every day | ORAL | 0 refills | Status: DC

## 2021-10-16 MED ORDER — LIDOCAINE 2% (20 MG/ML) 5 ML SYRINGE
INTRAMUSCULAR | Status: DC | PRN
  Administered 2021-10-16: 100 mg via INTRAVENOUS

## 2021-10-16 MED ORDER — PROPOFOL 10 MG/ML IV BOLUS
INTRAVENOUS | Status: DC | PRN
  Administered 2021-10-16: 30 mg via INTRAVENOUS

## 2021-10-16 MED ORDER — SODIUM CHLORIDE 0.9 % IV SOLN
INTRAVENOUS | Status: AC | PRN
  Administered 2021-10-16: 500 mL via INTRAVENOUS

## 2021-10-16 MED ORDER — SODIUM CHLORIDE 0.9 % IV SOLN: INTRAVENOUS | Status: DC

## 2021-10-16 NOTE — Transfer of Care (Signed)
Immediate Anesthesia Transfer of Care Note  Patient: Amanda Berry  Procedure(s) Performed: COLONOSCOPY ESOPHAGOGASTRODUODENOSCOPY (EGD) POLYPECTOMY HEMOSTASIS CLIP PLACEMENT  Patient Location: PACU  Anesthesia Type:MAC  Level of Consciousness: drowsy and patient cooperative  Airway & Oxygen Therapy: Patient Spontanous Breathing and Patient connected to nasal cannula oxygen  Post-op Assessment: Report given to RN and Post -op Vital signs reviewed and stable  Post vital signs: Reviewed and stable  Last Vitals:  Vitals Value Taken Time  BP 134/58 10/16/21 1038  Temp    Pulse 73 10/16/21 1039  Resp 26 10/16/21 1039  SpO2 99 % 10/16/21 1039  Vitals shown include unvalidated device data.  Last Pain:  Vitals:   10/16/21 0849  TempSrc: Temporal  PainSc: 0-No pain         Complications: No notable events documented.

## 2021-10-16 NOTE — Anesthesia Postprocedure Evaluation (Signed)
Anesthesia Post Note  Patient: Amanda Berry  Procedure(s) Performed: COLONOSCOPY ESOPHAGOGASTRODUODENOSCOPY (EGD) POLYPECTOMY HEMOSTASIS CLIP PLACEMENT     Patient location during evaluation: Endoscopy Anesthesia Type: MAC Level of consciousness: oriented, awake and alert and awake Pain management: pain level controlled Vital Signs Assessment: post-procedure vital signs reviewed and stable Respiratory status: spontaneous breathing, nonlabored ventilation, respiratory function stable and patient connected to nasal cannula oxygen Cardiovascular status: blood pressure returned to baseline and stable Postop Assessment: no headache, no backache and no apparent nausea or vomiting Anesthetic complications: no   No notable events documented.  Last Vitals:  Vitals:   10/16/21 1040 10/16/21 1100  BP:    Pulse:  66  Resp:  18  Temp: (P) 36.6 C 36.7 C  SpO2:  97%    Last Pain:  Vitals:   10/16/21 0849  TempSrc: Temporal  PainSc: 0-No pain                 Santa Lighter

## 2021-10-16 NOTE — Op Note (Signed)
[facility] Patient Name: Amanda Berry Procedure Date : 10/16/2021 MRN: [id number] Attending MD: Otis Brace , MD Date of Birth: 04/05/1934 CSN: [id number] Age: 88 Admit Type: Inpatient Procedure:                Colonoscopy Indications:              Iron deficiency anemia Providers:                Otis Brace, MD, Jeanella Cara, RN,                            Luan Moore, Technician, York Pellant, CRNA Referring MD:              Medicines:                Sedation Administered by an Anesthesia Professional Complications:            No immediate complications. Estimated Blood Loss:     Estimated blood loss was minimal. Procedure:                Pre-Anesthesia Assessment:                           - Prior to the procedure, a History and Physical                            was performed, and patient medications and                            allergies were reviewed. The patient's tolerance of                            previous anesthesia was also reviewed. The risks                            and benefits of the procedure and the sedation                            options and risks were discussed with the patient.                            All questions were answered, and informed consent                            was obtained. Prior Anticoagulants: The patient has                            taken Eliquis (apixaban), last dose was 2 days                            prior to procedure. ASA Grade Assessment: III - A                            patient with severe systemic disease. After  reviewing the risks and benefits, the patient was                            deemed in satisfactory condition to undergo the                            procedure.                           After obtaining informed consent, the colonoscope                            was passed under direct vision. Throughout the                             procedure, the patient's blood pressure, pulse, and                            oxygen saturations were monitored continuously. The                            PCF-190TL ([id number] Olympus colonoscope was                            introduced through the anus and advanced to the the                            terminal ileum, with identification of the                            appendiceal orifice and IC valve. The colonoscopy                            was performed without difficulty. The patient                            tolerated the procedure well. The quality of the                            bowel preparation was good except the sigmoid colon                            was fair. Scope In: 10:06:43 AM Scope Out: 10:31:03 AM Scope Withdrawal Time: 0 hours 20 minutes 55 seconds  Total Procedure Duration: 0 hours 24 minutes 20 seconds  Findings:      The perianal and digital rectal examinations were normal.      The terminal ileum appeared normal.      A 3 mm polyp was found in the ascending colon. The polyp was sessile.       The polyp was removed with a cold biopsy forceps. Resection and       retrieval were complete.      A 10 mm polyp was found in the transverse colon. The polyp was sessile.       The polyp was removed with a cold  snare. Resection and retrieval were       complete.      A 20 mm polyp was found in the descending colon. The polyp was       pedunculated. The polyp was removed with a hot snare. Resection and       retrieval were complete. To prevent bleeding after the polypectomy, one       hemostatic clip was successfully placed. There was no bleeding at the       end of the maneuver.      Multiple small and large-mouthed diverticula were found in the sigmoid       colon.      Internal hemorrhoids were found during retroflexion. The hemorrhoids       were small. Impression:               - The examined portion of the ileum was normal.                           -  One 3 mm polyp in the ascending colon, removed                            with a cold biopsy forceps. Resected and retrieved.                           - One 10 mm polyp in the transverse colon, removed                            with a cold snare. Resected and retrieved.                           - One 20 mm polyp in the descending colon, removed                            with a hot snare. Resected and retrieved. Clip was                            placed.                           - Diverticulosis in the sigmoid colon.                           - Internal hemorrhoids. Recommendation:           - Return patient to hospital ward for ongoing care.                           - Full liquid diet.                           - Continue present medications.                           - Resume Eliquis (apixaban) at prior dose in 3 days.                           -  No repeat colonoscopy due to current age (90                            years or older). Procedure Code(s):        --- Professional ---                           [phone], Colonoscopy, flexible; with removal of                            tumor(s), polyp(s), or other lesion(s) by snare                            technique                           45380, 20, Colonoscopy, flexible; with biopsy,                            single or multiple Diagnosis Code(s):        --- Professional ---                           K64.8, Other hemorrhoids                           K63.5, Polyp of colon                           D50.9, Iron deficiency anemia, unspecified                           K57.30, Diverticulosis of large intestine without                            perforation or abscess without bleeding CPT copyright 2019 American Medical Association. All rights reserved. The codes documented in this report are preliminary and upon coder review may  be revised to meet current compliance requirements. Otis Brace, MD Otis Brace, MD 10/16/2021  10:41:44 AM Number of Addenda: 0

## 2021-10-16 NOTE — Op Note (Signed)
[facility] Patient Name: Amanda Berry Procedure Date : 10/16/2021 MRN: [id number] Attending MD: Otis Brace , MD Date of Birth: [birth year] CSN: [id number] Age: 88 Admit Type: Inpatient Procedure:                Upper GI endoscopy Indications:              Iron deficiency anemia Providers:                Otis Brace, MD, Jeanella Cara, RN,                            Luan Moore, Eldridge Abrahams, CRNA Referring MD:              Medicines:                Sedation Administered by an Anesthesia Professional Complications:            No immediate complications. Estimated Blood Loss:     Estimated blood loss was minimal. Procedure:                Pre-Anesthesia Assessment:                           - Prior to the procedure, a History and Physical                            was performed, and patient medications and                            allergies were reviewed. The patient's tolerance of                            previous anesthesia was also reviewed. The risks                            and benefits of the procedure and the sedation                            options and risks were discussed with the patient.                            All questions were answered, and informed consent                            was obtained. Prior Anticoagulants: The patient has                            taken Eliquis (apixaban), last dose was 2 days                            prior to procedure. ASA Grade Assessment: III - A                            patient with severe systemic disease. After  reviewing the risks and benefits, the patient was                            deemed in satisfactory condition to undergo the                            procedure.                           After obtaining informed consent, the endoscope was                            passed under direct vision. Throughout the                             procedure, the patient's blood pressure, pulse, and                            oxygen saturations were monitored continuously. The                            [id number]-H190 ([id number]) Olympus endoscope was introduced                            through the mouth, and advanced to the second part                            of duodenum. The upper GI endoscopy was                            accomplished without difficulty. The patient                            tolerated the procedure well. Scope In: Scope Out: Findings:      LA Grade A (one or more mucosal breaks less than 5 mm, not extending       between tops of 2 mucosal folds) esophagitis with no bleeding was found       at the gastroesophageal junction.      Scattered mild inflammation characterized by erythema was found in the       entire examined stomach.      The cardia and gastric fundus were normal on retroflexion.      Scattered mild inflammation characterized by erythema was found in the       duodenal bulb.      The second portion of the duodenum was normal. Impression:               - LA Grade A reflux esophagitis with no bleeding.                           - Gastritis.                           - Duodenitis.                           -  Normal second portion of the duodenum.                           - No specimens collected. Recommendation:           - Perform a colonoscopy today. Procedure Code(s):        --- Professional ---                           [phone], Esophagogastroduodenoscopy, flexible,                            transoral; diagnostic, including collection of                            specimen(s) by brushing or washing, when performed                            (separate procedure) Diagnosis Code(s):        --- Professional ---                           K21.00, Gastro-esophageal reflux disease with                            esophagitis, without bleeding                           K29.70, Gastritis, unspecified,  without bleeding                           K29.80, Duodenitis without bleeding                           D50.9, Iron deficiency anemia, unspecified CPT copyright 2019 American Medical Association. All rights reserved. The codes documented in this report are preliminary and upon coder review may  be revised to meet current compliance requirements. Otis Brace, MD Otis Brace, MD 10/16/2021 10:37:29 AM Number of Addenda: 0

## 2021-10-16 NOTE — Brief Op Note (Signed)
10/13/2021 - 10/16/2021  10:44 AM  PATIENT:  Amanda Berry  88 y.o. female  PRE-OPERATIVE DIAGNOSIS:  iron deficiency anemia  POST-OPERATIVE DIAGNOSIS:  EGD WNL  Colon: diverticulosis, ascending, descending and transverse polypectomy  PROCEDURE:  Procedure(s): COLONOSCOPY (N/A) ESOPHAGOGASTRODUODENOSCOPY (EGD) (N/A) POLYPECTOMY HEMOSTASIS CLIP PLACEMENT  SURGEON:  Surgeon(s) and Role:    * Solangel Mcmanaway, MD - Primary  Findings ----------- -EGD showed mild esophagitis, mild gastritis and mild duodenitis. -Colonoscopy showed large, 2 cm polyp in the descending colon.  Removed with a hot snare.  1 clip placed.  She also had a 10 mm polyp in the transverse colon and diminutive polyp in the ascending colon.  Colonoscopy also showed diverticulosis and hemorrhoids.  Normal terminal ileum.  Recommendations ------------------------- -Start soft diet and advance as tolerated -Okay to resume Eliquis from Monday if no evidence of bleeding. -Recommend Protonix 40 mg once a day for 2 months -Called and discussed with patient's daughter. -Do Not recommend repeat surveillance colonoscopy because of advanced age of 6 at this time. -Okay to discharge from GI standpoint later today.  GI will sign off.  Call us back if needed.  Otis Brace MD, [place] 10/16/2021, 10:49 AM  Contact #  [phone]

## 2021-10-16 NOTE — Interval H&P Note (Signed)
History and Physical Interval Note:  10/16/2021 9:39 AM  Amanda Berry  has presented today for surgery, with the diagnosis of iron deficiency anemia.  The various methods of treatment have been discussed with the patient and family. After consideration of risks, benefits and other options for treatment, the patient has consented to  Procedure(s): COLONOSCOPY (N/A) ESOPHAGOGASTRODUODENOSCOPY (EGD) (N/A) as a surgical intervention.  The patient's history has been reviewed, patient examined, no change in status, stable for surgery.  I have reviewed the patient's chart and labs.  Questions were answered to the patient's satisfaction.     Kathalina Ostermann

## 2021-10-16 NOTE — Discharge Planning (Signed)
Patient discharged home in stable condition. Verbalizes understanding of all discharge instructions, including home medications and follow up appointments. 

## 2021-10-16 NOTE — Discharge Summary (Signed)
Amanda Berry WPY:[id number] DOB: 04-13-1934 DOA: 10/13/2021  PCP: Shirline Frees, MD  Admit date: 10/13/2021 Discharge date: 10/16/2021  Admitted From: Home Disposition: Home  Recommendations for Outpatient Follow-up:  Follow up with PCP in 1 week Please obtain BMP/CBC in one week Please follow up with GI in 2 weeks     Discharge Condition:Stable CODE STATUS: Full Diet recommendation: Heart Healthy  Brief/Interim Summary: Per Amanda Berry is a 88 y.o. female with medical history significant of PAF on Eliquis, HTN, HLD, remote history of iron deficiency anemia, was sent from PCPs office for evaluation of new onset of anemia.she went to see PCP for 35-month follow-up, CBC showed new onset of anemia hemoglobin 6.8 compared to 11 in November 2022.  PCP decided to send her to ED. she received 2 units packed red blood cells.  Hemoglobin remained stable posttransfusion.  GI was consulted she underwent EGD and colonoscopy today.  She is remained stable tolerated her feedings and will be discharged home today.     Acute on chronic iron deficiency anemia FOB neg S/p prbc  2 units Eliquis held, underwent EGD and colonoscopy today.  Per GI hold Eliquis until Monday if no further bleeds, can resume on Monday EGD with LA grade a reflux esophagitis with no bleeding.  Gastritis.  Duodenitis. Colonoscopy with one 3 mm polyp in the ascending colon status post resection.  One 10 mm polyp in transverse colon status post resection.  120 mm polyp in descending colon status post resection.  Diverticulosis of the sigmoid colon.  Internal hemorrhoids. GI recommended Protonix 40 mg daily x2 months. H&H stable posttransfusion     Stage I pressure ulcer on buttocks Wound care nursing was consulted   HTN Stable   PAF -In sinus Continue Cardizem Resume Eliquis on Monday if no further bleeds    Discharge Diagnoses:  Principal Problem:   Anemia Active Problems:   Symptomatic  anemia    Discharge Instructions  Discharge Instructions     Diet - low sodium heart healthy   Complete by: As directed    Discharge instructions   Complete by: As directed    F/u with pcp next week for blood work. Follow-up with GI in 2 weeks Resume Eliquis on Monday if no bleed   Discharge wound care:   Complete by: As directed    As above   Increase activity slowly   Complete by: As directed       Allergies as of 10/16/2021   No Known Allergies      Medication List     STOP taking these medications    chlorthalidone 25 MG tablet Commonly known as: HYGROTON   famotidine 20 MG tablet Commonly known as: PEPCID   losartan 50 MG tablet Commonly known as: COZAAR       TAKE these medications    acetaminophen 500 MG tablet Commonly known as: TYLENOL Take 1,000 mg by mouth every 6 (six) hours as needed for mild pain.   alendronate 70 MG tablet Commonly known as: FOSAMAX Take 70 mg by mouth every Sunday.   apixaban 5 MG Tabs tablet Commonly known as: ELIQUIS Take 1 tablet (5 mg total) by mouth 2 (two) times daily.   benzonatate 100 MG capsule Commonly known as: TESSALON Take 100 mg by mouth 3 (three) times daily as needed.   BIOFREEZE EX Apply 1 application topically as needed (Arthritis pain).   calcium carbonate 600 MG Tabs tablet Commonly known as: OS-CAL Take 600 mg  by mouth daily with breakfast.   diltiazem 240 MG 24 hr capsule Commonly known as: CARDIZEM CD Take 1 capsule by mouth once daily   pantoprazole 40 MG tablet Commonly known as: PROTONIX Take 1 tablet (40 mg total) by mouth daily. Start taking on: October 17, 2021   VITAMIN D3 PO Take by mouth daily.               Discharge Care Instructions  (From admission, onward)           Start     Ordered   10/16/21 0000  Discharge wound care:       Comments: As above   10/16/21 1307            Follow-up Information     Brahmbhatt, Parag, MD Follow up in 2  week(s).   Specialty: Gastroenterology Contact information: [place] [address] [phone]         Shirline Frees, MD Follow up in 1 week(s).   Specialty: Family Medicine Why: needs blood work Sport and exercise psychologist information: [place] [address] [phone]                No Known Allergies  Consultations: GI   Procedures/Studies: US Abdomen Limited RUQ (LIVER/GB)  Result Date: 10/14/2021 CLINICAL DATA:  Abnormal ultrasound, cirrhotic changes on prior ultrasound EXAM: ULTRASOUND ABDOMEN LIMITED RIGHT UPPER QUADRANT COMPARISON:  Ultrasound abdomen 01/28/2019 FINDINGS: Gallbladder: No gallstones or wall thickening visualized. No sonographic Murphy sign noted by sonographer. Common bile duct: Diameter: 4 mm. Liver: Query vague nodular hepatic contour. No focal lesion identified. Within normal limits in parenchymal echogenicity. Portal vein is patent on color Doppler imaging with normal direction of blood flow towards the liver. Other: None. IMPRESSION: 1. Query vague nodular hepatic contour. 2. Otherwise unremarkable right upper quadrant ultrasound. Electronically Signed   By: Iven Finn M.D.   On: 10/14/2021 21:27      Subjective: Feels well.  No further bleeds.  Tolerated feeding  Discharge Exam: Vitals:   10/16/21 1040 10/16/21 1100  BP:    Pulse:  66  Resp:  18  Temp: (P) 97.8 F (36.6 C) 98 F (36.7 C)  SpO2:  97%   Vitals:   10/16/21 0710 10/16/21 0849 10/16/21 1040 10/16/21 1100  BP: (!) 149/67 (!) 180/64    Pulse: 88 81  66  Resp: 16 20  18   Temp: 97.8 F (36.6 C) 98.3 F (36.8 C) (P) 97.8 F (36.6 C) 98 F (36.7 C)  TempSrc: Oral Temporal    SpO2: 94% 99%  97%  Weight:  72.7 kg    Height:        General: Pt is alert, awake, not in acute distress Cardiovascular: RRR, S1/S2 +, no rubs, no gallops Respiratory: CTA bilaterally, no wheezing, no rhonchi Abdominal: Soft, NT, ND, bowel sounds  + Extremities: no edema, no cyanosis    The results of significant diagnostics from this hospitalization (including imaging, microbiology, ancillary and laboratory) are listed below for reference.     Microbiology: Recent Results (from the past 240 hour(s))  Resp Panel by RT-PCR (Flu A&B, Covid) Nasopharyngeal Swab     Status: None   Collection Time: 10/13/21  2:55 PM   Specimen: Nasopharyngeal Swab; Nasopharyngeal(NP) swabs in vial transport medium  Result Value Ref Range Status   SARS Coronavirus 2 by RT PCR NEGATIVE NEGATIVE Final    Comment: (NOTE) SARS-CoV-2 target nucleic acids are  NOT DETECTED.  The SARS-CoV-2 RNA is generally detectable in upper respiratory specimens during the acute phase of infection. The lowest concentration of SARS-CoV-2 viral copies this assay can detect is 138 copies/mL. A negative result does not preclude SARS-Cov-2 infection and should not be used as the sole basis for treatment or other patient management decisions. A negative result may occur with  improper specimen collection/handling, submission of specimen other than nasopharyngeal swab, presence of viral mutation(s) within the areas targeted by this assay, and inadequate number of viral copies(<138 copies/mL). A negative result must be combined with clinical observations, patient history, and epidemiological information. The expected result is Negative.  Fact Sheet for Patients:  EntrepreneurPulse.com.au  Fact Sheet for Healthcare Providers:  IncredibleEmployment.be  This test is no t yet approved or cleared by the [place] FDA and  has been authorized for detection and/or diagnosis of SARS-CoV-2 by FDA under an Emergency Use Authorization (EUA). This EUA will remain  in effect (meaning this test can be used) for the duration of the COVID-19 declaration under Section 564(b)(1) of the Act, 21 U.S.C.section 360bbb-3(b)(1), unless the authorization  is terminated  or revoked sooner.       Influenza A by PCR NEGATIVE NEGATIVE Final   Influenza B by PCR NEGATIVE NEGATIVE Final    Comment: (NOTE) The Xpert Xpress SARS-CoV-2/FLU/RSV plus assay is intended as an aid in the diagnosis of influenza from Nasopharyngeal swab specimens and should not be used as a sole basis for treatment. Nasal washings and aspirates are unacceptable for Xpert Xpress SARS-CoV-2/FLU/RSV testing.  Fact Sheet for Patients: EntrepreneurPulse.com.au  Fact Sheet for Healthcare Providers: IncredibleEmployment.be  This test is not yet approved or cleared by the [place] FDA and has been authorized for detection and/or diagnosis of SARS-CoV-2 by FDA under an Emergency Use Authorization (EUA). This EUA will remain in effect (meaning this test can be used) for the duration of the COVID-19 declaration under Section 564(b)(1) of the Act, 21 U.S.C. section 360bbb-3(b)(1), unless the authorization is terminated or revoked.  Performed at [facility], [place] [address] [place], [place] [address]      Labs: BNP (last 3 results) No results for input(s): BNP in the last 8760 hours. Basic Metabolic Panel: Recent Labs  Lab 10/13/21 1502 10/14/21 0324 10/15/21 0146  NA 136 139  --   K 3.7 3.4* 4.4  CL 101 106  --   CO2 24 22  --   GLUCOSE 104* 97  --   BUN 31* 24*  --   CREATININE 1.35* 0.92  --   CALCIUM 9.6 8.7*  --    Liver Function Tests: Recent Labs  Lab 10/13/21 1502  AST 19  ALT 13  ALKPHOS 54  BILITOT 0.4  PROT 6.9  ALBUMIN 3.6   No results for input(s): LIPASE, AMYLASE in the last 168 hours. No results for input(s): AMMONIA in the last 168 hours. CBC: Recent Labs  Lab 10/13/21 1502 10/14/21 0324 10/15/21 0146  WBC 5.8 5.9 7.7  NEUTROABS 3.3  --   --   HGB 6.8* 9.1* 9.2*  HCT 25.2* 29.4* 30.4*  MCV 75.4* 75.4* 76.2*  PLT 349 265 265   Cardiac Enzymes: No results for input(s):  CKTOTAL, CKMB, CKMBINDEX, TROPONINI in the last 168 hours. BNP: Invalid input(s): POCBNP CBG: No results for input(s): GLUCAP in the last 168 hours. D-Dimer No results for input(s): DDIMER in the last 72 hours. Hgb A1c No results for input(s): HGBA1C in the  last 72 hours. Lipid Profile No results for input(s): CHOL, HDL, LDLCALC, TRIG, CHOLHDL, LDLDIRECT in the last 72 hours. Thyroid function studies No results for input(s): TSH, T4TOTAL, T3FREE, THYROIDAB in the last 72 hours.  Invalid input(s): FREET3 Anemia work up No results for input(s): VITAMINB12, FOLATE, FERRITIN, TIBC, IRON, RETICCTPCT in the last 72 hours. Urinalysis    Component Value Date/Time   COLORURINE AMBER (A) 01/25/2019 1730   APPEARANCEUR CLOUDY (A) 01/25/2019 1730   LABSPEC 1.018 01/25/2019 1730   PHURINE 5.0 01/25/2019 1730   GLUCOSEU NEGATIVE 01/25/2019 1730   HGBUR SMALL (A) 01/25/2019 1730   BILIRUBINUR NEGATIVE 01/25/2019 1730   KETONESUR NEGATIVE 01/25/2019 1730   PROTEINUR 30 (A) 01/25/2019 1730   UROBILINOGEN 0.2 03/01/2007 1131   NITRITE NEGATIVE 01/25/2019 1730   LEUKOCYTESUR TRACE (A) 01/25/2019 1730   Sepsis Labs Invalid input(s): PROCALCITONIN,  WBC,  LACTICIDVEN Microbiology Recent Results (from the past 240 hour(s))  Resp Panel by RT-PCR (Flu A&B, Covid) Nasopharyngeal Swab     Status: None   Collection Time: 10/13/21  2:55 PM   Specimen: Nasopharyngeal Swab; Nasopharyngeal(NP) swabs in vial transport medium  Result Value Ref Range Status   SARS Coronavirus 2 by RT PCR NEGATIVE NEGATIVE Final    Comment: (NOTE) SARS-CoV-2 target nucleic acids are NOT DETECTED.  The SARS-CoV-2 RNA is generally detectable in upper respiratory specimens during the acute phase of infection. The lowest concentration of SARS-CoV-2 viral copies this assay can detect is 138 copies/mL. A negative result does not preclude SARS-Cov-2 infection and should not be used as the sole basis for treatment or other  patient management decisions. A negative result may occur with  improper specimen collection/handling, submission of specimen other than nasopharyngeal swab, presence of viral mutation(s) within the areas targeted by this assay, and inadequate number of viral copies(<138 copies/mL). A negative result must be combined with clinical observations, patient history, and epidemiological information. The expected result is Negative.  Fact Sheet for Patients:  EntrepreneurPulse.com.au  Fact Sheet for Healthcare Providers:  IncredibleEmployment.be  This test is no t yet approved or cleared by the [place] FDA and  has been authorized for detection and/or diagnosis of SARS-CoV-2 by FDA under an Emergency Use Authorization (EUA). This EUA will remain  in effect (meaning this test can be used) for the duration of the COVID-19 declaration under Section 564(b)(1) of the Act, 21 U.S.C.section 360bbb-3(b)(1), unless the authorization is terminated  or revoked sooner.       Influenza A by PCR NEGATIVE NEGATIVE Final   Influenza B by PCR NEGATIVE NEGATIVE Final    Comment: (NOTE) The Xpert Xpress SARS-CoV-2/FLU/RSV plus assay is intended as an aid in the diagnosis of influenza from Nasopharyngeal swab specimens and should not be used as a sole basis for treatment. Nasal washings and aspirates are unacceptable for Xpert Xpress SARS-CoV-2/FLU/RSV testing.  Fact Sheet for Patients: EntrepreneurPulse.com.au  Fact Sheet for Healthcare Providers: IncredibleEmployment.be  This test is not yet approved or cleared by the [place] FDA and has been authorized for detection and/or diagnosis of SARS-CoV-2 by FDA under an Emergency Use Authorization (EUA). This EUA will remain in effect (meaning this test can be used) for the duration of the COVID-19 declaration under Section 564(b)(1) of the Act, 21 U.S.C. section  360bbb-3(b)(1), unless the authorization is terminated or revoked.  Performed at [facility], [place] [address], [place], [place] [address]      Time coordinating discharge: Over 30 minutes  SIGNED:  Nolberto Hanlon, MD  [facility] 10/16/2021, 1:18 PM Pager   If 7PM-7AM, please contact night-coverage [email or web address] Password [email or web address]

## 2021-10-18 ENCOUNTER — Encounter (HOSPITAL_COMMUNITY): Payer: Self-pay | Admitting: Gastroenterology

## 2021-10-19 LAB — SURGICAL PATHOLOGY

## 2021-10-22 DIAGNOSIS — K2100 Gastro-esophageal reflux disease with esophagitis, without bleeding: Secondary | ICD-10-CM | POA: Diagnosis not present

## 2021-10-22 DIAGNOSIS — D649 Anemia, unspecified: Secondary | ICD-10-CM | POA: Diagnosis not present

## 2021-10-22 DIAGNOSIS — N179 Acute kidney failure, unspecified: Secondary | ICD-10-CM | POA: Diagnosis not present

## 2021-10-22 DIAGNOSIS — Z09 Encounter for follow-up examination after completed treatment for conditions other than malignant neoplasm: Secondary | ICD-10-CM | POA: Diagnosis not present

## 2021-10-22 DIAGNOSIS — I10 Essential (primary) hypertension: Secondary | ICD-10-CM | POA: Diagnosis not present

## 2021-10-22 DIAGNOSIS — I480 Paroxysmal atrial fibrillation: Secondary | ICD-10-CM | POA: Diagnosis not present

## 2021-10-28 DIAGNOSIS — Z09 Encounter for follow-up examination after completed treatment for conditions other than malignant neoplasm: Secondary | ICD-10-CM | POA: Diagnosis not present

## 2021-11-04 DIAGNOSIS — D509 Iron deficiency anemia, unspecified: Secondary | ICD-10-CM | POA: Diagnosis not present

## 2021-12-02 DIAGNOSIS — I10 Essential (primary) hypertension: Secondary | ICD-10-CM | POA: Diagnosis not present

## 2021-12-02 DIAGNOSIS — E7800 Pure hypercholesterolemia, unspecified: Secondary | ICD-10-CM | POA: Diagnosis not present

## 2021-12-03 DIAGNOSIS — R944 Abnormal results of kidney function studies: Secondary | ICD-10-CM | POA: Diagnosis not present

## 2021-12-03 DIAGNOSIS — D509 Iron deficiency anemia, unspecified: Secondary | ICD-10-CM | POA: Diagnosis not present

## 2022-01-04 DIAGNOSIS — D509 Iron deficiency anemia, unspecified: Secondary | ICD-10-CM | POA: Diagnosis not present

## 2022-02-01 ENCOUNTER — Telehealth: Payer: Self-pay

## 2022-02-01 ENCOUNTER — Other Ambulatory Visit: Payer: Self-pay | Admitting: Cardiology

## 2022-02-01 DIAGNOSIS — R0602 Shortness of breath: Secondary | ICD-10-CM

## 2022-02-01 DIAGNOSIS — M2041 Other hammer toe(s) (acquired), right foot: Secondary | ICD-10-CM | POA: Diagnosis not present

## 2022-02-01 DIAGNOSIS — M79672 Pain in left foot: Secondary | ICD-10-CM | POA: Diagnosis not present

## 2022-02-01 DIAGNOSIS — M21619 Bunion of unspecified foot: Secondary | ICD-10-CM | POA: Diagnosis not present

## 2022-02-01 DIAGNOSIS — M79671 Pain in right foot: Secondary | ICD-10-CM | POA: Diagnosis not present

## 2022-02-01 NOTE — Telephone Encounter (Signed)
I tried to reach the pt to schedule a tele pre op appt. I tried both numbers on file, first # [phone] call could be completed, 2nd # [phone] no answer.

## 2022-02-01 NOTE — Telephone Encounter (Signed)
Clinical pharmacist to comment on Eliquis

## 2022-02-01 NOTE — Telephone Encounter (Signed)
Please arrange a virtual telephone visit for later this week for cardiac clearance which will allow time for our clinical pharmacist to comment on Eliquis.

## 2022-02-01 NOTE — Telephone Encounter (Signed)
   Pre-operative Risk Assessment    Patient Name: Amanda Berry  DOB: Jul 16, 1934 MRN: [id number]      Request for Surgical Clearance    Procedure:   Right hallux MPJ arthrodesis, 2-3 weil  Date of Surgery:  Clearance TBD                                 Surgeon:  Wylene Simmer, MD Surgeon's Group or Practice Name:  [facility] Phone number:  [phone] Fax number:  [phone] Kerri Maze   Type of Clearance Requested:   - Pharmacy:  Hold Apixaban (Eliquis)     Type of Anesthesia:   Regional block   Additional requests/questions:   How long should Eliquis be held?  Signed, Elsie Lincoln Gloria Ricardo   02/01/2022, 1:28 PM

## 2022-02-02 NOTE — Telephone Encounter (Signed)
Patient with diagnosis of afib on Eliquis for anticoagulation.    Procedure: Right hallux MPJ arthrodesis, 2-3 weil Date of procedure: TBD  CHA2DS2-VASc Score = 4  This indicates a 4.8% annual risk of stroke. The patient's score is based upon: CHF History: 0 HTN History: 1 Diabetes History: 0 Stroke History: 0 Vascular Disease History: 0 Age Score: 2 Gender Score: 1   CrCl 24m/min using adjusted body weight Platelet count 265K  Per office protocol, patient can hold Eliquis for 2 days prior to procedure.

## 2022-02-03 ENCOUNTER — Telehealth: Payer: Self-pay | Admitting: *Deleted

## 2022-02-03 NOTE — Telephone Encounter (Signed)
Pt agreeable to plan of care for tele pre op appt 02/05/22 @ 9 am. Med rec and consent are done.  Pt tells me while going over her med list that she called in for Chlorthalidone to be refilled but then was told she did not need this anymore. She said Dr. Stanford Breed last filled this medication for her. I assured the pt that I will send a note to Dr. Jacalyn Lefevre nurse Hilda Blades to reach out to her to let he know if she needs to continue the Chlorthalidone or not. Pt thanked me for my call and my help today.     Patient Consent for Virtual Visit        Amanda Berry has provided verbal consent on 02/03/2022 for a virtual visit (video or telephone).   CONSENT FOR VIRTUAL VISIT FOR:  Amanda Berry  By participating in this virtual visit I agree to the following:  I hereby voluntarily request, consent and authorize [place] and its employed or contracted physicians, physician assistants, nurse practitioners or other licensed health care professionals (the Practitioner), to provide me with telemedicine health care services (the "Services") as deemed necessary by the treating Practitioner. I acknowledge and consent to receive the Services by the Practitioner via telemedicine. I understand that the telemedicine visit will involve communicating with the Practitioner through live audiovisual communication technology and the disclosure of certain medical information by electronic transmission. I acknowledge that I have been given the opportunity to request an in-person assessment or other available alternative prior to the telemedicine visit and am voluntarily participating in the telemedicine visit.  I understand that I have the right to withhold or withdraw my consent to the use of telemedicine in the course of my care at any time, without affecting my right to future care or treatment, and that the Practitioner or I may terminate the telemedicine visit at any time. I understand that I have the right to inspect  all information obtained and/or recorded in the course of the telemedicine visit and may receive copies of available information for a reasonable fee.  I understand that some of the potential risks of receiving the Services via telemedicine include:  Delay or interruption in medical evaluation due to technological equipment failure or disruption; Information transmitted may not be sufficient (e.g. poor resolution of images) to allow for appropriate medical decision making by the Practitioner; and/or  In rare instances, security protocols could fail, causing a breach of personal health information.  Furthermore, I acknowledge that it is my responsibility to provide information about my medical history, conditions and care that is complete and accurate to the best of my ability. I acknowledge that Practitioner's advice, recommendations, and/or decision may be based on factors not within their control, such as incomplete or inaccurate data provided by me or distortions of diagnostic images or specimens that may result from electronic transmissions. I understand that the practice of medicine is not an exact science and that Practitioner makes no warranties or guarantees regarding treatment outcomes. I acknowledge that a copy of this consent can be made available to me via my patient portal ([place]), or I can request a printed copy by calling the office of [facility].    I understand that my insurance will be billed for this visit.   I have read or had this consent read to me. I understand the contents of this consent, which adequately explains the benefits and risks of the Services being provided via telemedicine.  I have been provided ample opportunity to ask questions regarding this consent and the Services and have had my questions answered to my satisfaction. I give my informed consent for the services to be provided through the use of telemedicine in my medical care

## 2022-02-03 NOTE — Telephone Encounter (Signed)
Pt agreeable to plan of care for tele pre op appt 02/05/22 @ 9 am. Med rec and consent are done.  Pt tells me while going over her med list that she called in for Chlorthalidone to be refilled but then was told she did not need this anymore. She said Dr. Stanford Breed last filled this medication for her. I assured the pt that I will send a note to Dr. Jacalyn Lefevre nurse Hilda Blades to reach out to her to let he know if she needs to continue the Chlorthalidone or not. Pt thanked me for my call and my help today.

## 2022-02-04 ENCOUNTER — Telehealth: Payer: Self-pay | Admitting: *Deleted

## 2022-02-04 NOTE — Telephone Encounter (Signed)
Left detailed message for patient, according to the discharge summary from the hospital, the chlorthalidone was stopped in February.

## 2022-02-04 NOTE — Telephone Encounter (Signed)
-----   Message from Michae Kava, [place] sent at 02/03/2022  2:55 PM EDT ----- Regarding: Medication question Hi Phillp Dolores,   I was speaking with pt to set up for a tele pre op appt when she was telling me about a refill for Chlorthalidone. Please see my message below.   Thank you Arbie Cookey    Pt tells me while going over her med list that she called in for Chlorthalidone to be refilled but then was told she did not need this anymore. She said Dr. Stanford Breed last filled this medication for her. I assured the pt that I will send a note to Dr. Jacalyn Lefevre nurse Hilda Blades to reach out to her to let he know if she needs to continue the Chlorthalidone or not. Pt thanked me for my call and my help today.

## 2022-02-05 ENCOUNTER — Ambulatory Visit (INDEPENDENT_AMBULATORY_CARE_PROVIDER_SITE_OTHER): Payer: PPO | Admitting: Nurse Practitioner

## 2022-02-05 DIAGNOSIS — Z01810 Encounter for preprocedural cardiovascular examination: Secondary | ICD-10-CM | POA: Diagnosis not present

## 2022-02-05 NOTE — Progress Notes (Signed)
Virtual Visit via Telephone Note   Because of Amanda Berry's co-morbid illnesses, she is at least at moderate risk for complications without adequate follow up.  This format is felt to be most appropriate for this patient at this time.  The patient did not have access to video technology/had technical difficulties with video requiring transitioning to audio format only (telephone).  All issues noted in this document were discussed and addressed.  No physical exam could be performed with this format.  Please refer to the patient's chart for her consent to telehealth for [facility].  Evaluation Performed:  Preoperative cardiovascular risk assessment _____________   Date:  02/05/2022   Patient ID:  Amanda Berry, DOB Aug 14, 1934, MRN [id number] Patient Location:  Home Provider location:   Office  Primary Care Provider:  Shirline Frees, MD Primary Cardiologist:  Kirk Ruths, MD  Chief Complaint / Patient Profile   88 y.o. y/o female with a h/o paroxysmal atrial fibrillation on Eliquis, mild aortic stenosis, hypertension, and symptomatic anemia who is pending right hallux MPJ arthrodesis, 2-3 weil, date TBD, with Dr. Wylene Simmer of [facility] and presents today for telephonic preoperative cardiovascular risk assessment.  Past Medical History    Past Medical History:  Diagnosis Date   Constipation    Fracture of femur, right, closed ([place]) 2017   Hypertension    Overactive bladder    Symptomatic anemia 03/2016   Past Surgical History:  Procedure Laterality Date   COLONOSCOPY N/A 10/16/2021   Procedure: COLONOSCOPY;  Surgeon: Otis Brace, MD;  Location: [facility] ENDOSCOPY;  Service: Gastroenterology;  Laterality: N/A;   ESOPHAGOGASTRODUODENOSCOPY N/A 10/16/2021   Procedure: ESOPHAGOGASTRODUODENOSCOPY (EGD);  Surgeon: Otis Brace, MD;  Location: [facility] ENDOSCOPY;  Service: Gastroenterology;  Laterality: N/A;   HEMOSTASIS CLIP PLACEMENT  10/16/2021   Procedure: HEMOSTASIS CLIP  PLACEMENT;  Surgeon: Otis Brace, MD;  Location: [facility] ENDOSCOPY;  Service: Gastroenterology;;   KNEE ARTHROPLASTY Right    approx 10 years ago   ORIF PERIPROSTHETIC FRACTURE Right 04/12/2016   Procedure: OPEN REDUCTION INTERNAL FIXATION (ORIF) RIGHT DISTAL FEMUR  PERIPROSTHETIC FRACTURE;  Surgeon: Paralee Cancel, MD;  Location: [facility] ORS;  Service: Orthopedics;  Laterality: Right;   POLYPECTOMY  10/16/2021   Procedure: POLYPECTOMY;  Surgeon: Otis Brace, MD;  Location: [facility] ENDOSCOPY;  Service: Gastroenterology;;   ROTATOR CUFF REPAIR Right 1990's    Allergies  No Known Allergies  History of Present Illness    Amanda Berry is a 88 y.o. female who presents via audio/video conferencing for a telehealth visit today.  Pt was last seen in cardiology clinic on 07/10/2021 by Dr. Stanford Breed. At that time Waunita Schooner Temples was doing well, cardiac standpoint. The patient is now pending procedure as outlined above. Since her last visit, she has been stable from a cardiac standpoint.  She has had some symptomatic anemia and has required blood transfusions. She did note some fatigue and mild dyspnea on exertion when her hemoglobin was low. However, this has since improved.  She denies chest pain, palpitations, dyspnea, pnd, orthopnea, n, v, dizziness, syncope, edema, weight gain, or early satiety. All other systems reviewed and are otherwise negative except as noted above. Overall, she reports feeling well and denies any new symptoms or concerns today.   Home Medications    Prior to Admission medications   Medication Sig Start Date End Date Taking? Authorizing Provider  acetaminophen (TYLENOL) 500 MG tablet Take 1,000 mg by mouth every 6 (six) hours as needed for mild pain.  [provider]  alendronate (FOSAMAX) 70 MG tablet Take 70 mg by mouth every Sunday. 01/05/19   [provider]  apixaban (ELIQUIS) 5 MG TABS tablet Take 1 tablet (5 mg total) by mouth 2 (two) times daily. 01/31/19    Georgette Shell, MD  benzonatate (TESSALON) 100 MG capsule Take 100 mg by mouth 3 (three) times daily as needed. 09/17/21   [provider]  calcium carbonate (OS-CAL) 600 MG TABS tablet Take 600 mg by mouth daily with breakfast.    [provider]  Cholecalciferol (VITAMIN D3 PO) Take by mouth daily.    [provider]  diltiazem (CARDIZEM CD) 240 MG 24 hr capsule Take 1 capsule by mouth once daily 09/24/21   Lelon Perla, MD  Menthol, Topical Analgesic, (BIOFREEZE EX) Apply 1 application topically as needed (Arthritis pain).    [provider]  pantoprazole (PROTONIX) 40 MG tablet Take 1 tablet (40 mg total) by mouth daily. 10/17/21 02/03/22  Nolberto Hanlon, MD    Physical Exam    Vital Signs:  Linde C Vater does not have vital signs available for review today.  Given telephonic nature of communication, physical exam is limited. AAOx3. NAD. Normal affect.  Speech and respirations are unlabored.  Accessory Clinical Findings    None  Assessment & Plan    1.  Preoperative Cardiovascular Risk Assessment:  According to the Revised Cardiac Risk Index (RCRI), her Perioperative Risk of Major Cardiac Event is (%): 0.4. Her Functional Capacity in METs is: 7.01 according to the [place] Activity Status Index (DASI). Therefore, based on ACC/AHA guidelines, patient would be at acceptable risk for the planned procedure without further cardiovascular testing.    Patient was advised that if she develops any new symptoms prior to surgery to contact her office to arrange follow-up appointment. She verbalized understanding.  Patient with diagnosis of afib on Eliquis for anticoagulation.     Procedure: Right hallux MPJ arthrodesis, 2-3 weil Date of procedure: TBD   CHA2DS2-VASc Score = 4  This indicates a 4.8% annual risk of stroke. The patient's score is based upon: CHF History: 0 HTN History: 1 Diabetes History: 0 Stroke History: 0 Vascular Disease History:  0 Age Score: 2 Gender Score: 1   CrCl 71m/min using adjusted body weight Platelet count 265K   Per office protocol, patient can hold Eliquis for 2 days prior to procedure. Please resume Eliquis as soon as possible postprocedure, at the discretion of the surgeon.  A copy of this note will be routed to requesting surgeon.  Time:   Today, I have spent 6 minutes with the patient with telehealth technology discussing medical history, symptoms, and management plan.     ELenna Sciara NP  02/05/2022, 9:00 AM

## 2022-02-15 ENCOUNTER — Other Ambulatory Visit: Payer: Self-pay | Admitting: Cardiology

## 2022-02-15 DIAGNOSIS — I10 Essential (primary) hypertension: Secondary | ICD-10-CM

## 2022-02-19 DIAGNOSIS — E7800 Pure hypercholesterolemia, unspecified: Secondary | ICD-10-CM | POA: Diagnosis not present

## 2022-02-19 DIAGNOSIS — K219 Gastro-esophageal reflux disease without esophagitis: Secondary | ICD-10-CM | POA: Diagnosis not present

## 2022-02-19 DIAGNOSIS — I10 Essential (primary) hypertension: Secondary | ICD-10-CM | POA: Diagnosis not present

## 2022-02-19 DIAGNOSIS — M810 Age-related osteoporosis without current pathological fracture: Secondary | ICD-10-CM | POA: Diagnosis not present

## 2022-02-27 ENCOUNTER — Other Ambulatory Visit: Payer: Self-pay | Admitting: Cardiology

## 2022-02-27 DIAGNOSIS — I10 Essential (primary) hypertension: Secondary | ICD-10-CM

## 2022-03-16 DIAGNOSIS — M2011 Hallux valgus (acquired), right foot: Secondary | ICD-10-CM | POA: Diagnosis not present

## 2022-03-16 DIAGNOSIS — G8918 Other acute postprocedural pain: Secondary | ICD-10-CM | POA: Diagnosis not present

## 2022-03-16 DIAGNOSIS — M2021 Hallux rigidus, right foot: Secondary | ICD-10-CM | POA: Diagnosis not present

## 2022-03-16 DIAGNOSIS — M21611 Bunion of right foot: Secondary | ICD-10-CM | POA: Diagnosis not present

## 2022-03-16 DIAGNOSIS — M7741 Metatarsalgia, right foot: Secondary | ICD-10-CM | POA: Diagnosis not present

## 2022-03-16 DIAGNOSIS — M2041 Other hammer toe(s) (acquired), right foot: Secondary | ICD-10-CM | POA: Diagnosis not present

## 2022-03-25 DIAGNOSIS — E7800 Pure hypercholesterolemia, unspecified: Secondary | ICD-10-CM | POA: Diagnosis not present

## 2022-03-25 DIAGNOSIS — I10 Essential (primary) hypertension: Secondary | ICD-10-CM | POA: Diagnosis not present

## 2022-04-07 ENCOUNTER — Telehealth: Payer: Self-pay | Admitting: Cardiology

## 2022-04-07 NOTE — Telephone Encounter (Signed)
Katy (CMA) informed patient is to continue to check BP.

## 2022-04-07 NOTE — Telephone Encounter (Signed)
Left message for Amanda Berry to call back.

## 2022-04-07 NOTE — Telephone Encounter (Signed)
Pt c/o BP issue: STAT if pt c/o blurred vision, one-sided weakness or slurred speech  1. What are your last 5 BP readings? Yesterday 170/71, 8/6 165/70  2. Are you having any other symptoms (ex. Dizziness, headache, blurred vision, passed out)? no  3. What is your BP issue? Katy with [facility] says the patient's BP has been high and their clinical pharmacist wanted to call and see if she needs to restart losartan. Phone: [phone]

## 2022-04-07 NOTE — Telephone Encounter (Addendum)
Katy from [facility] gave patient's home BP readings.Marland KitchenMarland Kitchen8/6 135/70 and 8/8 170/71. She wanted to know if losartan '50mg'$  daily should be restarted. I called patient and left message for her to call back.

## 2022-04-12 DIAGNOSIS — Z4889 Encounter for other specified surgical aftercare: Secondary | ICD-10-CM | POA: Diagnosis not present

## 2022-04-12 DIAGNOSIS — M21619 Bunion of unspecified foot: Secondary | ICD-10-CM | POA: Diagnosis not present

## 2022-04-22 DIAGNOSIS — G4700 Insomnia, unspecified: Secondary | ICD-10-CM | POA: Diagnosis not present

## 2022-04-22 DIAGNOSIS — E7800 Pure hypercholesterolemia, unspecified: Secondary | ICD-10-CM | POA: Diagnosis not present

## 2022-04-22 DIAGNOSIS — D509 Iron deficiency anemia, unspecified: Secondary | ICD-10-CM | POA: Diagnosis not present

## 2022-04-22 DIAGNOSIS — I10 Essential (primary) hypertension: Secondary | ICD-10-CM | POA: Diagnosis not present

## 2022-04-22 DIAGNOSIS — I480 Paroxysmal atrial fibrillation: Secondary | ICD-10-CM | POA: Diagnosis not present

## 2022-05-05 DIAGNOSIS — M21619 Bunion of unspecified foot: Secondary | ICD-10-CM | POA: Diagnosis not present

## 2022-05-05 DIAGNOSIS — Z4889 Encounter for other specified surgical aftercare: Secondary | ICD-10-CM | POA: Diagnosis not present

## 2022-06-04 DIAGNOSIS — Z4889 Encounter for other specified surgical aftercare: Secondary | ICD-10-CM | POA: Diagnosis not present

## 2022-07-05 ENCOUNTER — Ambulatory Visit: Payer: PPO | Attending: Nurse Practitioner | Admitting: Nurse Practitioner

## 2022-07-05 ENCOUNTER — Encounter: Payer: Self-pay | Admitting: Nurse Practitioner

## 2022-07-05 VITALS — BP 172/82 | HR 73 | Ht 59.5 in | Wt 155.0 lb

## 2022-07-05 DIAGNOSIS — I350 Nonrheumatic aortic (valve) stenosis: Secondary | ICD-10-CM | POA: Diagnosis not present

## 2022-07-05 DIAGNOSIS — D649 Anemia, unspecified: Secondary | ICD-10-CM

## 2022-07-05 DIAGNOSIS — M21611 Bunion of right foot: Secondary | ICD-10-CM | POA: Diagnosis not present

## 2022-07-05 DIAGNOSIS — I480 Paroxysmal atrial fibrillation: Secondary | ICD-10-CM | POA: Diagnosis not present

## 2022-07-05 DIAGNOSIS — I10 Essential (primary) hypertension: Secondary | ICD-10-CM

## 2022-07-05 MED ORDER — CHLORTHALIDONE 25 MG PO TABS: 25.0000 mg | ORAL_TABLET | Freq: Every day | ORAL | 3 refills | Status: DC

## 2022-07-05 NOTE — Progress Notes (Signed)
Office Visit    Patient Name: Amanda Berry Date of Encounter: 07/05/2022  Primary Care Provider:  Shirline Frees, MD Primary Cardiologist:  Kirk Ruths, MD  Chief Complaint    88 year old female with a history of paroxysmal atrial fibrillation on Eliquis, mild aortic valve stenosis, hypertension, and symptomatic anemia who presents for follow-up related to atrial fibrillation and hypertension.  Past Medical History    Past Medical History:  Diagnosis Date   Constipation    Fracture of femur, right, closed ([place]) 2017   Hypertension    Overactive bladder    Symptomatic anemia 03/2016   Past Surgical History:  Procedure Laterality Date   COLONOSCOPY N/A 10/16/2021   Procedure: COLONOSCOPY;  Surgeon: Otis Brace, MD;  Location: [facility] ENDOSCOPY;  Service: Gastroenterology;  Laterality: N/A;   ESOPHAGOGASTRODUODENOSCOPY N/A 10/16/2021   Procedure: ESOPHAGOGASTRODUODENOSCOPY (EGD);  Surgeon: Otis Brace, MD;  Location: [facility] ENDOSCOPY;  Service: Gastroenterology;  Laterality: N/A;   HEMOSTASIS CLIP PLACEMENT  10/16/2021   Procedure: HEMOSTASIS CLIP PLACEMENT;  Surgeon: Otis Brace, MD;  Location: [facility] ENDOSCOPY;  Service: Gastroenterology;;   KNEE ARTHROPLASTY Right    approx 10 years ago   ORIF PERIPROSTHETIC FRACTURE Right 04/12/2016   Procedure: OPEN REDUCTION INTERNAL FIXATION (ORIF) RIGHT DISTAL FEMUR  PERIPROSTHETIC FRACTURE;  Surgeon: Paralee Cancel, MD;  Location: [facility] ORS;  Service: Orthopedics;  Laterality: Right;   POLYPECTOMY  10/16/2021   Procedure: POLYPECTOMY;  Surgeon: Otis Brace, MD;  Location: [facility] ENDOSCOPY;  Service: Gastroenterology;;   ROTATOR CUFF REPAIR Right 46's    Allergies  No Known Allergies  History of Present Illness    88 year old female with the above past medical history including paroxysmal atrial fibrillation on Eliquis, mild aortic valve stenosis, hypertension, and symptomatic anemia.  She was hospitalized in May 2020 with  respiratory failure and was treated for pneumonia/sepsis.  Her hospital course was complicated by atrial fibrillation and renal insufficiency.  She was started on diltiazem and Eliquis.  Echocardiogram in 01/2019 showed evidence of mild aortic stenosis.  Most recent echocardiogram in June 2022 showed normal LV function, mild right atrial enlargement, no evidence of aortic valve stenosis.  She was last seen in the office on 07/10/2021 and was stable from a cardiac standpoint.  She was hospitalized in February 2023 in the setting of symptomatic anemia.  EGD and colonoscopy were overall stable.  She received 2 units PRBC.   She presents today for follow-up. Since her last visit she has been stable from a cardiac standpoint.  She notes occasional mild shortness of breath, unchanged from prior visits.  She denies any chest pain, edema, PND, orthopnea.  She recently underwent bunion surgery and is working on gradually increasing her activity. Overall, she reports feeling well.  Home Medications    Current Outpatient Medications  Medication Sig Dispense Refill   acetaminophen (TYLENOL) 500 MG tablet Take 1,000 mg by mouth every 6 (six) hours as needed for mild pain.     alendronate (FOSAMAX) 70 MG tablet Take 70 mg by mouth every Sunday.     apixaban (ELIQUIS) 5 MG TABS tablet Take 1 tablet (5 mg total) by mouth 2 (two) times daily. 60 tablet 1   benzonatate (TESSALON) 100 MG capsule Take 100 mg by mouth 3 (three) times daily as needed.     calcium carbonate (OS-CAL) 600 MG TABS tablet Take 600 mg by mouth daily with breakfast.     chlorthalidone (HYGROTON) 25 MG tablet Take 1 tablet (25 mg total) by mouth  daily. 90 tablet 3   Cholecalciferol (VITAMIN D3 PO) Take by mouth daily.     diltiazem (CARDIZEM CD) 240 MG 24 hr capsule Take 1 capsule by mouth once daily 90 capsule 2   Menthol, Topical Analgesic, (BIOFREEZE EX) Apply 1 application topically as needed (Arthritis pain).     pantoprazole (PROTONIX) 40  MG tablet Take 1 tablet (40 mg total) by mouth daily. 60 tablet 0   No current facility-administered medications for this visit.     Review of Systems    She denies chest pain, palpitations, dyspnea, pnd, orthopnea, n, v, dizziness, syncope, edema, weight gain, or early satiety. All other systems reviewed and are otherwise negative except as noted above.   Physical Exam    VS:  BP (!) 172/82   Pulse 73   Ht 4' 11.5" (1.511 m)   Wt 155 lb (70.3 kg)   SpO2 96%   BMI 30.78 kg/m  GEN: Well nourished, well developed, in no acute distress. HEENT: normal. Neck: Supple, no JVD, carotid bruits, or masses. Cardiac: RRR, no murmurs, rubs, or gallops. No clubbing, cyanosis, edema.  Radials/DP/PT 2+ and equal bilaterally.  Respiratory:  Respirations regular and unlabored, clear to auscultation bilaterally. GI: Soft, nontender, nondistended, BS + x 4. MS: no deformity or atrophy. Skin: warm and dry, no rash. Neuro:  Strength and sensation are intact. Psych: Normal affect.  Accessory Clinical Findings    ECG personally reviewed by me today - NSR, 73, sinus arrhythmia - no acute changes.   Lab Results  Component Value Date   WBC 7.7 10/15/2021   HGB 9.2 (L) 10/15/2021   HCT 30.4 (L) 10/15/2021   MCV 76.2 (L) 10/15/2021   PLT 265 10/15/2021   Lab Results  Component Value Date   CREATININE 0.92 10/14/2021   BUN 24 (H) 10/14/2021   NA 139 10/14/2021   K 4.4 10/15/2021   CL 106 10/14/2021   CO2 22 10/14/2021   Lab Results  Component Value Date   ALT 13 10/13/2021   AST 19 10/13/2021   ALKPHOS 54 10/13/2021   BILITOT 0.4 10/13/2021   No results found for: "CHOL", "HDL", "LDLCALC", "LDLDIRECT", "TRIG", "CHOLHDL"  No results found for: "HGBA1C"  Assessment & Plan   1. Paroxysmal atrial fibrillation: Maintaining NSR. Continue Diltiazem, Eliquis.   2. Aortic stenosis: Not appreciated on most recent echo. She   3. Hypertension: BP elevated in office today.  She states that her  chlorthalidone was discontinued during her hospitalization in February.  BP has been elevated since this time.  Will restart chlorthalidone 25 mg daily.  We will check BMET in 2 weeks.  Continue to monitor BP.   4. Anemia: CBC was 0.7 in 03/2022. Stable. Monitored managed per PCP.  5. Disposition: Follow-up in 6-8 weeks.   HYPERTENSION CONTROL Vitals:   07/05/22 0837 07/05/22 0904  BP: (!) 190/72 (!) 172/82    The patient's blood pressure is elevated above target today.  In order to address the patient's elevated BP: A new medication was prescribed today.; Blood pressure will be monitored at home to determine if medication changes need to be made.; Follow up with general cardiology has been recommended.     Lenna Sciara, NP 07/05/2022, 9:09 AM

## 2022-07-05 NOTE — Patient Instructions (Signed)
Medication Instructions:  Start Chlorthalidone 25 mg ( Take 1 Tablet Daily).  *If you need a refill on your cardiac medications before your next appointment, please call your pharmacy*   Lab Work: BMET If you have labs (blood work) drawn today and your tests are completely normal, you will receive your results only by: [place] (if you have MyChart) OR A paper copy in the mail If you have any lab test that is abnormal or we need to change your treatment, we will call you to review the results.   Testing/Procedures: No Testing   Follow-Up: At [facility], you and your health needs are our priority.  As part of our continuing mission to provide you with exceptional heart care, we have created designated Provider Care Teams.  These Care Teams include your primary Cardiologist (physician) and Advanced Practice Providers (APPs -  Physician Assistants and Nurse Practitioners) who all work together to provide you with the care you need, when you need it.  We recommend signing up for the patient portal called "MyChart".  Sign up information is provided on this After Visit Summary.  MyChart is used to connect with patients for Virtual Visits (Telemedicine).  Patients are able to view lab/test results, encounter notes, upcoming appointments, etc.  Non-urgent messages can be sent to your provider as well.   To learn more about what you can do with MyChart, go to [email or web address].    Your next appointment:   6-8 week(s)  The format for your next appointment:   In Person  Provider:   Diona Browner, NP

## 2022-07-12 ENCOUNTER — Other Ambulatory Visit: Payer: Self-pay | Admitting: Cardiology

## 2022-07-19 DIAGNOSIS — D649 Anemia, unspecified: Secondary | ICD-10-CM | POA: Diagnosis not present

## 2022-07-19 DIAGNOSIS — I480 Paroxysmal atrial fibrillation: Secondary | ICD-10-CM | POA: Diagnosis not present

## 2022-07-19 DIAGNOSIS — I350 Nonrheumatic aortic (valve) stenosis: Secondary | ICD-10-CM | POA: Diagnosis not present

## 2022-07-19 DIAGNOSIS — I10 Essential (primary) hypertension: Secondary | ICD-10-CM | POA: Diagnosis not present

## 2022-07-20 LAB — BASIC METABOLIC PANEL
BUN/Creatinine Ratio: 23 (ref 12–28)
BUN: 22 mg/dL (ref 8–27)
CO2: 23 mmol/L (ref 20–29)
Calcium: 9.7 mg/dL (ref 8.7–10.3)
Chloride: 100 mmol/L (ref 96–106)
Creatinine, Ser: 0.95 mg/dL (ref 0.57–1.00)
Glucose: 80 mg/dL (ref 70–99)
Potassium: 3.7 mmol/L (ref 3.5–5.2)
Sodium: 139 mmol/L (ref 134–144)
eGFR: 58 mL/min/{1.73_m2} — ABNORMAL LOW (ref >59)

## 2022-07-27 ENCOUNTER — Telehealth: Payer: Self-pay

## 2022-07-27 NOTE — Telephone Encounter (Signed)
Lmom, to discuss lab results. Waiting on a return call.  

## 2022-07-28 ENCOUNTER — Telehealth: Payer: Self-pay | Admitting: Cardiology

## 2022-07-28 NOTE — Telephone Encounter (Signed)
Left message of results for pt  

## 2022-07-28 NOTE — Telephone Encounter (Signed)
Patient is returning CMA's call regarding her lab results. Please advise.

## 2022-08-02 NOTE — Telephone Encounter (Signed)
Noted  

## 2022-08-18 DIAGNOSIS — M2041 Other hammer toe(s) (acquired), right foot: Secondary | ICD-10-CM | POA: Diagnosis not present

## 2022-08-18 DIAGNOSIS — M21612 Bunion of left foot: Secondary | ICD-10-CM | POA: Diagnosis not present

## 2022-08-18 DIAGNOSIS — M21611 Bunion of right foot: Secondary | ICD-10-CM | POA: Diagnosis not present

## 2022-08-19 ENCOUNTER — Telehealth: Payer: Self-pay | Admitting: Cardiology

## 2022-08-19 DIAGNOSIS — I10 Essential (primary) hypertension: Secondary | ICD-10-CM | POA: Diagnosis not present

## 2022-08-19 DIAGNOSIS — D649 Anemia, unspecified: Secondary | ICD-10-CM

## 2022-08-19 DIAGNOSIS — E7800 Pure hypercholesterolemia, unspecified: Secondary | ICD-10-CM | POA: Diagnosis not present

## 2022-08-19 NOTE — Telephone Encounter (Signed)
Spoke with Joellen Jersey at [facility] who stated the pharmacist Ernst Bowler want to know if patient could have CBC before her appointment tomorrow due to anemia. Please advise.

## 2022-08-19 NOTE — Telephone Encounter (Signed)
Patient advised she can get CBC before tomorrow or tomorrow before her appointment. Order placed.

## 2022-08-19 NOTE — Telephone Encounter (Signed)
Office calling to ask if pt's CBC can be checked st tomorrows appt., due to anemia. Please advise

## 2022-08-20 ENCOUNTER — Other Ambulatory Visit: Payer: Self-pay

## 2022-08-20 ENCOUNTER — Encounter: Payer: Self-pay | Admitting: Nurse Practitioner

## 2022-08-20 ENCOUNTER — Ambulatory Visit: Payer: PPO | Attending: Nurse Practitioner | Admitting: Nurse Practitioner

## 2022-08-20 VITALS — BP 140/72 | HR 86 | Ht 60.0 in | Wt 156.0 lb

## 2022-08-20 DIAGNOSIS — I10 Essential (primary) hypertension: Secondary | ICD-10-CM | POA: Diagnosis not present

## 2022-08-20 DIAGNOSIS — I350 Nonrheumatic aortic (valve) stenosis: Secondary | ICD-10-CM | POA: Diagnosis not present

## 2022-08-20 DIAGNOSIS — I480 Paroxysmal atrial fibrillation: Secondary | ICD-10-CM | POA: Diagnosis not present

## 2022-08-20 DIAGNOSIS — D649 Anemia, unspecified: Secondary | ICD-10-CM

## 2022-08-20 DIAGNOSIS — R0602 Shortness of breath: Secondary | ICD-10-CM

## 2022-08-20 MED ORDER — VALSARTAN 80 MG PO TABS
80.0000 mg | ORAL_TABLET | Freq: Every day | ORAL | 3 refills | Status: DC
Start: 2022-08-20 — End: 2023-08-16

## 2022-08-20 NOTE — Patient Instructions (Signed)
Medication Instructions:  Start Valsartan 80 mg daily  *If you need a refill on your cardiac medications before your next appointment, please call your pharmacy*   Lab Work: Your physician recommends that you return for lab work in 2 weeks BMET  If you have labs (blood work) drawn today and your tests are completely normal, you will receive your results only by: [place] (if you have MyChart) OR A paper copy in the mail If you have any lab test that is abnormal or we need to change your treatment, we will call you to review the results.   Testing/Procedures: Your physician has requested that you have an echocardiogram. Echocardiography is a painless test that uses sound waves to create images of your heart. It provides your doctor with information about the size and shape of your heart and how well your heart's chambers and valves are working. This procedure takes approximately one hour. There are no restrictions for this procedure. Please do NOT wear cologne, perfume, aftershave, or lotions (deodorant is allowed). Please arrive 15 minutes prior to your appointment time.    Follow-Up: At [facility], you and your health needs are our priority.  As part of our continuing mission to provide you with exceptional heart care, we have created designated Provider Care Teams.  These Care Teams include your primary Cardiologist (physician) and Advanced Practice Providers (APPs -  Physician Assistants and Nurse Practitioners) who all work together to provide you with the care you need, when you need it.  We recommend signing up for the patient portal called "MyChart".  Sign up information is provided on this After Visit Summary.  MyChart is used to connect with patients for Virtual Visits (Telemedicine).  Patients are able to view lab/test results, encounter notes, upcoming appointments, etc.  Non-urgent messages can be sent to your provider as well.   To learn more about what you  can do with MyChart, go to [email or web address].    Your next appointment:   1 month(s)  The format for your next appointment:   In Person  Provider:   Diona Browner, NP        Other Instructions   Important Information About [place]

## 2022-08-20 NOTE — Progress Notes (Signed)
Office Visit    Patient Name: Amanda Berry Date of Encounter: 08/20/2022  Primary Care Provider:  Shirline Frees, MD Primary Cardiologist:  Kirk Ruths, MD  Chief Complaint    88 year old female with a history of paroxysmal atrial fibrillation on Eliquis, mild aortic valve stenosis, hypertension, and symptomatic anemia who presents for follow-up related to atrial fibrillation and hypertension.  Past Medical History    Past Medical History:  Diagnosis Date   Constipation    Fracture of femur, right, closed ([place]) 2017   Hypertension    Overactive bladder    Symptomatic anemia 03/2016   Past Surgical History:  Procedure Laterality Date   COLONOSCOPY N/A 10/16/2021   Procedure: COLONOSCOPY;  Surgeon: Otis Brace, MD;  Location: [facility];  Service: Gastroenterology;  Laterality: N/A;   ESOPHAGOGASTRODUODENOSCOPY N/A 10/16/2021   Procedure: ESOPHAGOGASTRODUODENOSCOPY (EGD);  Surgeon: Otis Brace, MD;  Location: [facility] ENDOSCOPY;  Service: Gastroenterology;  Laterality: N/A;   HEMOSTASIS CLIP PLACEMENT  10/16/2021   Procedure: HEMOSTASIS CLIP PLACEMENT;  Surgeon: Otis Brace, MD;  Location: [facility] ENDOSCOPY;  Service: Gastroenterology;;   KNEE ARTHROPLASTY Right    approx 10 years ago   ORIF PERIPROSTHETIC FRACTURE Right 04/12/2016   Procedure: OPEN REDUCTION INTERNAL FIXATION (ORIF) RIGHT DISTAL FEMUR  PERIPROSTHETIC FRACTURE;  Surgeon: Paralee Cancel, MD;  Location: [facility] ORS;  Service: Orthopedics;  Laterality: Right;   POLYPECTOMY  10/16/2021   Procedure: POLYPECTOMY;  Surgeon: Otis Brace, MD;  Location: [facility] ENDOSCOPY;  Service: Gastroenterology;;   ROTATOR CUFF REPAIR Right 38's    Allergies  No Known Allergies  History of Present Illness    88 year old female with the above past medical history including paroxysmal atrial fibrillation on Eliquis, mild aortic valve stenosis, hypertension, and symptomatic anemia.   She was hospitalized in May 2020  with respiratory failure and was treated for pneumonia/sepsis.  Her hospital course was complicated by atrial fibrillation and renal insufficiency.  She was started on diltiazem and Eliquis. Echocardiogram in 01/2019 showed evidence of mild aortic stenosis.  Most recent echocardiogram in June 2022 showed normal LV function, mild right atrial enlargement, no evidence of aortic valve stenosis.  She was hospitalized in February 2023 in the setting of symptomatic anemia.  EGD and colonoscopy were overall stable.  She received 2 units PRBC. She was last seen in the office on 07/05/2022 and was stable from a cardiac standpoint.  Her BP was elevated.  She was restarted on her chlorthalidone 25 mg daily.   She presents today for follow-up. Since her last visit she has been stable overall from a cardiac standpoint.  She does note some recent increased dyspnea on exertion, generalized fatigue.  She is concerned that she may have some ongoing anemia, though she denies any bleeding.  She denies chest pain, palpitations, dizziness, edema, PND, orthopnea, weight gain.  Home Medications    Current Outpatient Medications  Medication Sig Dispense Refill   acetaminophen (TYLENOL) 500 MG tablet Take 1,000 mg by mouth every 6 (six) hours as needed for mild pain.     apixaban (ELIQUIS) 5 MG TABS tablet Take 1 tablet (5 mg total) by mouth 2 (two) times daily. 60 tablet 1   calcium carbonate (OS-CAL) 600 MG TABS tablet Take 600 mg by mouth daily with breakfast.     chlorthalidone (HYGROTON) 25 MG tablet Take 1 tablet (25 mg total) by mouth daily. 90 tablet 3   Cholecalciferol (VITAMIN D3 PO) Take by mouth daily.     diltiazem (CARDIZEM CD)  240 MG 24 hr capsule Take 1 capsule by mouth once daily 90 capsule 0   Menthol, Topical Analgesic, (BIOFREEZE EX) Apply 1 application topically as needed (Arthritis pain).     valsartan (DIOVAN) 80 MG tablet Take 1 tablet (80 mg total) by mouth daily. 90 tablet 3   alendronate (FOSAMAX) 70  MG tablet Take 70 mg by mouth every Sunday. (Patient not taking: Reported on 08/20/2022)     benzonatate (TESSALON) 100 MG capsule Take 100 mg by mouth 3 (three) times daily as needed. (Patient not taking: Reported on 08/20/2022)     No current facility-administered medications for this visit.     Review of Systems    She denies chest pain, palpitations, pnd, orthopnea, n, v, dizziness, syncope, edema, weight gain, or early satiety. All other systems reviewed and are otherwise negative except as noted above.   Physical Exam    VS:  BP (!) 140/72   Pulse 86   Ht 5' (1.524 m)   Wt 156 lb (70.8 kg)   SpO2 98%   BMI 30.47 kg/m  GEN: Well nourished, well developed, in no acute distress. HEENT: normal. Neck: Supple, no JVD, carotid bruits, or masses. Cardiac: RRR, no murmurs, rubs, or gallops. No clubbing, cyanosis, edema.  Radials/DP/PT 2+ and equal bilaterally.  Respiratory:  Respirations regular and unlabored, clear to auscultation bilaterally. GI: Soft, nontender, nondistended, BS + x 4. MS: no deformity or atrophy. Skin: warm and dry, no rash. Neuro:  Strength and sensation are intact. Psych: Normal affect.  Accessory Clinical Findings    ECG personally reviewed by me today -EKG in office today.- no acute changes.   Lab Results  Component Value Date   WBC 7.7 10/15/2021   HGB 9.2 (L) 10/15/2021   HCT 30.4 (L) 10/15/2021   MCV 76.2 (L) 10/15/2021   PLT 265 10/15/2021   Lab Results  Component Value Date   CREATININE 0.95 07/19/2022   BUN 22 07/19/2022   NA 139 07/19/2022   K 3.7 07/19/2022   CL 100 07/19/2022   CO2 23 07/19/2022   Lab Results  Component Value Date   ALT 13 10/13/2021   AST 19 10/13/2021   ALKPHOS 54 10/13/2021   BILITOT 0.4 10/13/2021   No results found for: "CHOL", "HDL", "LDLCALC", "LDLDIRECT", "TRIG", "CHOLHDL"  No results found for: "HGBA1C"  Assessment & Plan    1. Paroxysmal atrial fibrillation: Maintaining NSR on exam. Continue  Diltiazem, Eliquis.    2. Aortic stenosis/shortness of breath: AS not appreciated on most recent echo in 01/2021. She does note some ongoing dyspnea on exertion, generalized fatigue.  Euvolemic and well compensated on exam.  She does have a history of anemia, will repeat CBC today.  Will repeat echo.   3. Hypertension: BP remains elevated above goal in office today.  She has noticed SBP in the 140s to 150s at home.  Will add valsartan 80 mg daily.  Will check BMET in 2 weeks.  Continue to monitor BP and report BP consistently greater than 140/80.  Otherwise, continue current antihypertensive regimen.    4. Anemia: CBC was 11.7 in 03/2022.  Some increased dyspnea on exertion, mild fatigue.  Denies bleeding.  Will repeat CBC.  Otherwise, monitored managed per PCP.   5. Disposition: Follow-up in 1 month.  HYPERTENSION CONTROL Vitals:   08/20/22 1122 08/20/22 1146  BP: (!) 144/68 (!) 140/72    The patient's blood pressure is elevated above target today.  In order to address  the patient's elevated BP: A new medication was prescribed today.; Follow up with general cardiology has been recommended.; Blood pressure will be monitored at home to determine if medication changes need to be made.     Lenna Sciara, NP 08/20/2022, 11:51 AM

## 2022-08-21 LAB — CBC
Hematocrit: 32.9 % — ABNORMAL LOW (ref 34.0–46.6)
Hemoglobin: 10.5 g/dL — ABNORMAL LOW (ref 11.1–15.9)
MCH: 26.5 pg — ABNORMAL LOW (ref 26.6–33.0)
MCHC: 31.9 g/dL (ref 31.5–35.7)
MCV: 83 fL (ref 79–97)
Platelets: 479 10*3/uL — ABNORMAL HIGH (ref 150–450)
RBC: 3.96 x10E6/uL (ref 3.77–5.28)
RDW: 15.2 % (ref 11.7–15.4)
WBC: 6.9 10*3/uL (ref 3.4–10.8)

## 2022-08-26 ENCOUNTER — Telehealth: Payer: Self-pay

## 2022-08-26 NOTE — Telephone Encounter (Signed)
Pt returned call. Pt was notified of results, will f/u with her PCP, continue current medications and f/u as planned.

## 2022-08-26 NOTE — Telephone Encounter (Signed)
Lmom to discuss lab results and recommendations. Waiting on a return call.  

## 2022-09-03 DIAGNOSIS — I480 Paroxysmal atrial fibrillation: Secondary | ICD-10-CM | POA: Diagnosis not present

## 2022-09-03 DIAGNOSIS — D649 Anemia, unspecified: Secondary | ICD-10-CM | POA: Diagnosis not present

## 2022-09-03 DIAGNOSIS — I10 Essential (primary) hypertension: Secondary | ICD-10-CM | POA: Diagnosis not present

## 2022-09-03 DIAGNOSIS — I350 Nonrheumatic aortic (valve) stenosis: Secondary | ICD-10-CM | POA: Diagnosis not present

## 2022-09-03 LAB — BASIC METABOLIC PANEL
BUN/Creatinine Ratio: 17 (ref 12–28)
BUN: 17 mg/dL (ref 8–27)
CO2: 24 mmol/L (ref 20–29)
Calcium: 9.3 mg/dL (ref 8.7–10.3)
Chloride: 102 mmol/L (ref 96–106)
Creatinine, Ser: 1 mg/dL (ref 0.57–1.00)
Glucose: 69 mg/dL — ABNORMAL LOW (ref 70–99)
Potassium: 4.3 mmol/L (ref 3.5–5.2)
Sodium: 142 mmol/L (ref 134–144)
eGFR: 55 mL/min/{1.73_m2} — ABNORMAL LOW (ref >59)

## 2022-09-06 ENCOUNTER — Telehealth: Payer: Self-pay

## 2022-09-06 NOTE — Telephone Encounter (Signed)
Lmom to discuss lab results, waiting on a return call.

## 2022-09-07 NOTE — Telephone Encounter (Signed)
Pt returned call and was notified of lab results. Pt will continue her current medication and follow up as planned.

## 2022-09-20 DIAGNOSIS — M19071 Primary osteoarthritis, right ankle and foot: Secondary | ICD-10-CM | POA: Diagnosis not present

## 2022-09-20 DIAGNOSIS — M2041 Other hammer toe(s) (acquired), right foot: Secondary | ICD-10-CM | POA: Diagnosis not present

## 2022-09-20 DIAGNOSIS — M21611 Bunion of right foot: Secondary | ICD-10-CM | POA: Diagnosis not present

## 2022-09-20 DIAGNOSIS — M21612 Bunion of left foot: Secondary | ICD-10-CM | POA: Diagnosis not present

## 2022-09-22 ENCOUNTER — Ambulatory Visit (HOSPITAL_COMMUNITY): Payer: PPO | Attending: Nurse Practitioner

## 2022-09-22 DIAGNOSIS — I350 Nonrheumatic aortic (valve) stenosis: Secondary | ICD-10-CM | POA: Diagnosis not present

## 2022-09-22 LAB — ECHOCARDIOGRAM COMPLETE
Area-P 1/2: 1.97 cm2
S' Lateral: 1.9 cm

## 2022-09-29 ENCOUNTER — Telehealth: Payer: Self-pay

## 2022-09-29 NOTE — Telephone Encounter (Signed)
Attempted calling pt. Not able to leave a VM to discuss echocardiogram results. Will call back.

## 2022-10-06 NOTE — Telephone Encounter (Signed)
Recent echocardiogram showed overall normal heart pumping function, the left bottom chamber of the heart is mildly thickened and stiff (common age-related finding), there is no evidence of aortic stenosis or any other significant valvular abnormality.  Overall a good report.  Continue current medications and follow-up as planned.

## 2022-10-11 ENCOUNTER — Encounter: Payer: Self-pay | Admitting: Nurse Practitioner

## 2022-10-11 ENCOUNTER — Ambulatory Visit: Payer: PPO | Attending: Nurse Practitioner | Admitting: Nurse Practitioner

## 2022-10-11 VITALS — BP 118/72 | HR 83 | Ht 60.0 in | Wt 152.8 lb

## 2022-10-11 DIAGNOSIS — I350 Nonrheumatic aortic (valve) stenosis: Secondary | ICD-10-CM

## 2022-10-11 DIAGNOSIS — R0602 Shortness of breath: Secondary | ICD-10-CM

## 2022-10-11 DIAGNOSIS — I10 Essential (primary) hypertension: Secondary | ICD-10-CM | POA: Diagnosis not present

## 2022-10-11 DIAGNOSIS — I480 Paroxysmal atrial fibrillation: Secondary | ICD-10-CM

## 2022-10-11 DIAGNOSIS — D649 Anemia, unspecified: Secondary | ICD-10-CM

## 2022-10-11 NOTE — Progress Notes (Signed)
Office Visit    Patient Name: Amanda Berry Date of Encounter: 10/11/2022  Primary Care Provider:  Shirline Frees, MD Primary Cardiologist:  Kirk Ruths, MD  Chief Complaint    89 year old female with a history of paroxysmal atrial fibrillation on Eliquis, mild aortic valve stenosis, hypertension, and symptomatic anemia who presents for follow-up related to atrial fibrillation and hypertension.   Past Medical History    Past Medical History:  Diagnosis Date   Constipation    Fracture of femur, right, closed ([place]) 2017   Hypertension    Overactive bladder    Symptomatic anemia 03/2016   Past Surgical History:  Procedure Laterality Date   COLONOSCOPY N/A 10/16/2021   Procedure: COLONOSCOPY;  Surgeon: Otis Brace, MD;  Location: [facility]COPY;  Service: Gastroenterology;  Laterality: N/A;   ESOPHAGOGASTRODUODENOSCOPY N/A 10/16/2021   Procedure: ESOPHAGOGASTRODUODENOSCOPY (EGD);  Surgeon: Otis Brace, MD;  Location[facility] ENDOSCOPY;  Service: Gastroenterology;  Laterality: N/A;   HEMOSTASIS CLIP PLACEMENT  10/16/2021   Procedure: HEMOSTASIS CLIP PLACEMENT;  Surgeon: Otis Brace, MD;  Location: [place] ENDOSCOPY;  Service: Gastroenterology;;   KNEE ARTHROPLASTY Right    approx 10 years ago   ORIF PERIPROSTHETIC FRACTURE Right 04/12/2016   Procedure: OPEN REDUCTION INTERNAL FIXATION (ORIF) RIGHT DISTAL FEMUR  PERIPROSTHETIC FRACTURE;  Surgeon: Paralee Cancel, MD;  Location: [facility] OR[facility];  Service: Orthopedics;  Laterality: Right;   POLYPECTOMY  10/16/2021   Procedure: POLYPECTOMY;  Surgeon: Otis Brace, MD;  Location: [facility] ENDOSCOPY;  Service: Gastroenterology;;   ROTATOR CUFF REPAIR Right 1990's    Allergies  No Known Allergies   Labs/Other Studies Reviewed    The following studies were reviewed today: Echo 09/22/2022: IMPRESSIONS     1. Left ventricular ejection fraction, by estimation, is 60 to 65%. The  left ventricle has normal function. The left ventricle  has no regional  wall motion abnormalities. There is mild concentric left ventricular  hypertrophy. Left ventricular diastolic  parameters are indeterminate.   2. Right ventricular systolic function is normal. The right ventricular  size is normal.   3. The mitral valve is normal in structure. No evidence of mitral valve  regurgitation. No evidence of mitral stenosis.   4. The aortic valve is normal in structure. Aortic valve regurgitation is  not visualized. No aortic stenosis is present.   5. The inferior vena cava is normal in size with greater than 50%  respiratory variability, suggesting right atrial pressure of 3 mmHg.    Recent Labs: 10/13/2021: ALT 13 08/20/2022: Hemoglobin 10.5; Platelets 479 09/03/2022: BUN 17; Creatinine, Ser 1.00; Potassium 4.3; Sodium 142  Recent Lipid Panel No results found for: "CHOL", "TRIG", "HDL", "CHOLHDL", "VLDL", "LDLCALC", "LDLDIRECT"  History of Present Illness    89 year old female with the above past medical history including paroxysmal atrial fibrillation on Eliquis, mild aortic valve stenosis, hypertension, and symptomatic anemia.   She was hospitalized in May 2020 with respiratory failure and was treated for pneumonia/sepsis.  Her hospital course was complicated by atrial fibrillation and renal insufficiency.  She was started on diltiazem and Eliquis. Echocardiogram in 01/2019 showed evidence of mild aortic stenosis. Repeat echocardiogram in June 2022 showed normal LV function, mild right atrial enlargement, no evidence of aortic valve stenosis.  She was hospitalized in February 2023 in the setting of symptomatic anemia.  EGD and colonoscopy were overall stable.  She received 2 units PRBC. Chlorthalidone was restarted in 06/2022 in the setting of elevated BP.  She was last seen in the office on  08/20/2022 and was stable overall from a cardiac standpoint.  She did note some increased dyspnea on exertion, generalized fatigue.  Repeat echocardiogram in  08/2022 showed EF 60-65%, normal LV function, mild concentric LVH, no significant valvular abnormalities.  Her hemoglobin was stable at 10.5.  Her BP was elevated and she was started on valsartan 80 mg daily.   She presents today for follow-up. Since her last visit she has done well from a cardiac standpoint.  Her BP has been well-controlled.  She notes an improvement in her shortness of breath and fatigue.  Her husband suffered a car accident 2 weeks ago and she has been caring for him since.  She admits this has been somewhat stressful.  Otherwise, she reports feeling well.  Home Medications    Current Outpatient Medications  Medication Sig Dispense Refill   acetaminophen (TYLENOL) 500 MG tablet Take 1,000 mg by mouth every 6 (six) hours as needed for mild pain.     apixaban (ELIQUIS) 5 MG TABS tablet Take 1 tablet (5 mg total) by mouth 2 (two) times daily. 60 tablet 1   benzonatate (TESSALON) 100 MG capsule Take 100 mg by mouth 3 (three) times daily as needed.     calcium carbonate (OS-CAL) 600 MG TABS tablet Take 600 mg by mouth daily with breakfast.     chlorthalidone (HYGROTON) 25 MG tablet Take 1 tablet (25 mg total) by mouth daily. 90 tablet 3   Cholecalciferol (VITAMIN D3 PO) Take by mouth daily.     diltiazem (CARDIZEM CD) 240 MG 24 hr capsule Take 1 capsule by mouth once daily 90 capsule 0   Menthol, Topical Analgesic, (BIOFREEZE EX) Apply 1 application topically as needed (Arthritis pain).     valsartan (DIOVAN) 80 MG tablet Take 1 tablet (80 mg total) by mouth daily. 90 tablet 3   No current facility-administered medications for this visit.     Review of Systems    She denies chest pain, palpitations, dyspnea, pnd, orthopnea, n, v, dizziness, syncope, edema, weight gain, or early satiety. All other systems reviewed and are otherwise negative except as noted above.   Physical Exam    VS:  BP 118/72   Pulse 83   Ht 5' (1.524 m)   Wt 152 lb 12.8 oz (69.3 kg)   SpO2 96%   BMI  29.84 kg/m   GEN: Well nourished, well developed, in no acute distress. HEENT: normal. Neck: Supple, no JVD, carotid bruits, or masses. Cardiac: RRR, no murmurs, rubs, or gallops. No clubbing, cyanosis, edema.  Radials/DP/PT 2+ and equal bilaterally.  Respiratory:  Respirations regular and unlabored, clear to auscultation bilaterally. GI: Soft, nontender, nondistended, BS + x 4. MS: no deformity or atrophy. Skin: warm and dry, no rash. Neuro:  Strength and sensation are intact. Psych: Normal affect.  Accessory Clinical Findings    ECG personally reviewed by me today - No EKG in office today.    Lab Results  Component Value Date   WBC 6.9 08/20/2022   HGB 10.5 (L) 08/20/2022   HCT 32.9 (L) 08/20/2022   MCV 83 08/20/2022   PLT 479 (H) 08/20/2022   Lab Results  Component Value Date   CREATININE 1.00 09/03/2022   BUN 17 09/03/2022   NA 142 09/03/2022   K 4.3 09/03/2022   CL 102 09/03/2022   CO2 24 09/03/2022   Lab Results  Component Value Date   ALT 13 10/13/2021   AST 19 10/13/2021   ALKPHOS 54 10/13/2021  BILITOT 0.4 10/13/2021   No results found for: "CHOL", "HDL", "LDLCALC", "LDLDIRECT", "TRIG", "CHOLHDL"  No results found for: "HGBA1C"  Assessment & Plan    1. Hypertension: BP well controlled. Continue current antihypertensive regimen.   2. Paroxysmal atrial fibrillation: Maintaining NSR on exam. Continue Diltiazem, Eliquis.    3. Aortic stenosis/shortness of breath: AS not appreciated on most recent echo in 08/2022.  Shortness of breath has resolved.  Consider repeat echocardiogram as clinically indicated.   4. Anemia: CBC was stable at 10.5 in 07/2022.   5. Disposition: Follow-up in 1 year, sooner if needed.      Lenna Sciara, NP 10/11/2022, 9:32 AM

## 2022-10-11 NOTE — Patient Instructions (Signed)
Medication Instructions:  Your physician recommends that you continue on your current medications as directed. Please refer to the Current Medication list given to you today.  *If you need a refill on your cardiac medications before your next appointment, please call your pharmacy*   Lab Work: NONE ordered at this time of appointment   If you have labs (blood work) drawn today and your tests are completely normal, you will receive your results only by: [place] (if you have MyChart) OR A paper copy in the mail If you have any lab test that is abnormal or we need to change your treatment, we will call you to review the results.   Testing/Procedures: NONE ordered at this time of appointment     Follow-Up: At [facility], you and your health needs are our priority.  As part of our continuing mission to provide you with exceptional heart care, we have created designated Provider Care Teams.  These Care Teams include your primary Cardiologist (physician) and Advanced Practice Providers (APPs -  Physician Assistants and Nurse Practitioners) who all work together to provide you with the care you need, when you need it.  We recommend signing up for the patient portal called "MyChart".  Sign up information is provided on this After Visit Summary.  MyChart is used to connect with patients for Virtual Visits (Telemedicine).  Patients are able to view lab/test results, encounter notes, upcoming appointments, etc.  Non-urgent messages can be sent to your provider as well.   To learn more about what you can do with MyChart, go to [email or web address].    Your next appointment:   1 year(s)  Provider:   Kirk Ruths, MD     Other Instructions

## 2022-10-17 ENCOUNTER — Other Ambulatory Visit: Payer: Self-pay | Admitting: Cardiology

## 2022-10-19 DIAGNOSIS — B349 Viral infection, unspecified: Secondary | ICD-10-CM | POA: Diagnosis not present

## 2022-10-19 DIAGNOSIS — R051 Acute cough: Secondary | ICD-10-CM | POA: Diagnosis not present

## 2022-11-29 DIAGNOSIS — Z0000 Encounter for general adult medical examination without abnormal findings: Secondary | ICD-10-CM | POA: Diagnosis not present

## 2022-11-29 DIAGNOSIS — D6869 Other thrombophilia: Secondary | ICD-10-CM | POA: Diagnosis not present

## 2022-11-29 DIAGNOSIS — M85852 Other specified disorders of bone density and structure, left thigh: Secondary | ICD-10-CM | POA: Diagnosis not present

## 2022-11-29 DIAGNOSIS — I10 Essential (primary) hypertension: Secondary | ICD-10-CM | POA: Diagnosis not present

## 2022-11-29 DIAGNOSIS — I480 Paroxysmal atrial fibrillation: Secondary | ICD-10-CM | POA: Diagnosis not present

## 2022-11-29 DIAGNOSIS — E7800 Pure hypercholesterolemia, unspecified: Secondary | ICD-10-CM | POA: Diagnosis not present

## 2022-11-29 DIAGNOSIS — G4700 Insomnia, unspecified: Secondary | ICD-10-CM | POA: Diagnosis not present

## 2022-11-29 DIAGNOSIS — D509 Iron deficiency anemia, unspecified: Secondary | ICD-10-CM | POA: Diagnosis not present

## 2022-12-01 ENCOUNTER — Other Ambulatory Visit: Payer: Self-pay | Admitting: Family Medicine

## 2022-12-01 DIAGNOSIS — M85852 Other specified disorders of bone density and structure, left thigh: Secondary | ICD-10-CM

## 2023-01-11 ENCOUNTER — Other Ambulatory Visit: Payer: Self-pay | Admitting: Family Medicine

## 2023-01-11 DIAGNOSIS — Z1231 Encounter for screening mammogram for malignant neoplasm of breast: Secondary | ICD-10-CM

## 2023-04-16 ENCOUNTER — Other Ambulatory Visit: Payer: Self-pay | Admitting: Cardiology

## 2023-06-06 DIAGNOSIS — Z23 Encounter for immunization: Secondary | ICD-10-CM | POA: Diagnosis not present

## 2023-06-06 DIAGNOSIS — D509 Iron deficiency anemia, unspecified: Secondary | ICD-10-CM | POA: Diagnosis not present

## 2023-06-06 DIAGNOSIS — I480 Paroxysmal atrial fibrillation: Secondary | ICD-10-CM | POA: Diagnosis not present

## 2023-06-06 DIAGNOSIS — I10 Essential (primary) hypertension: Secondary | ICD-10-CM | POA: Diagnosis not present

## 2023-06-06 DIAGNOSIS — G4700 Insomnia, unspecified: Secondary | ICD-10-CM | POA: Diagnosis not present

## 2023-06-06 DIAGNOSIS — D6869 Other thrombophilia: Secondary | ICD-10-CM | POA: Diagnosis not present

## 2023-06-06 DIAGNOSIS — E7800 Pure hypercholesterolemia, unspecified: Secondary | ICD-10-CM | POA: Diagnosis not present

## 2023-06-16 ENCOUNTER — Other Ambulatory Visit: Payer: Self-pay | Admitting: Nurse Practitioner

## 2023-08-04 ENCOUNTER — Ambulatory Visit
Admission: RE | Admit: 2023-08-04 | Discharge: 2023-08-04 | Disposition: A | Discharge status: Home / Self Care | Payer: PPO | Source: Ambulatory Visit | Attending: Family Medicine | Admitting: Family Medicine

## 2023-08-04 DIAGNOSIS — E2839 Other primary ovarian failure: Secondary | ICD-10-CM | POA: Diagnosis not present

## 2023-08-04 DIAGNOSIS — N958 Other specified menopausal and perimenopausal disorders: Secondary | ICD-10-CM | POA: Diagnosis not present

## 2023-08-04 DIAGNOSIS — M85852 Other specified disorders of bone density and structure, left thigh: Secondary | ICD-10-CM

## 2023-08-04 DIAGNOSIS — Z1231 Encounter for screening mammogram for malignant neoplasm of breast: Secondary | ICD-10-CM | POA: Diagnosis not present

## 2023-08-04 DIAGNOSIS — M8588 Other specified disorders of bone density and structure, other site: Secondary | ICD-10-CM | POA: Diagnosis not present

## 2023-08-16 ENCOUNTER — Other Ambulatory Visit: Payer: Self-pay | Admitting: Nurse Practitioner

## 2023-09-13 ENCOUNTER — Other Ambulatory Visit: Payer: Self-pay | Admitting: Nurse Practitioner

## 2023-10-13 ENCOUNTER — Ambulatory Visit: Payer: PPO | Attending: Nurse Practitioner | Admitting: Nurse Practitioner

## 2023-10-13 ENCOUNTER — Encounter: Payer: Self-pay | Admitting: Nurse Practitioner

## 2023-10-13 VITALS — BP 124/72 | HR 59 | Ht 59.0 in | Wt 137.0 lb

## 2023-10-13 DIAGNOSIS — I350 Nonrheumatic aortic (valve) stenosis: Secondary | ICD-10-CM

## 2023-10-13 DIAGNOSIS — I10 Essential (primary) hypertension: Secondary | ICD-10-CM

## 2023-10-13 DIAGNOSIS — R0602 Shortness of breath: Secondary | ICD-10-CM

## 2023-10-13 DIAGNOSIS — I480 Paroxysmal atrial fibrillation: Secondary | ICD-10-CM

## 2023-10-13 DIAGNOSIS — D649 Anemia, unspecified: Secondary | ICD-10-CM

## 2023-10-13 NOTE — Patient Instructions (Signed)
Medication Instructions:  Your physician recommends that you continue on your current medications as directed. Please refer to the Current Medication list given to you today.  *If you need a refill on your cardiac medications before your next appointment, please call your pharmacy*   Lab Work: NONE ordered at this time of appointment   Testing/Procedures: NONE ordered at this time of appointment   Follow-Up: At [facility], you and your health needs are our priority.  As part of our continuing mission to provide you with exceptional heart care, we have created designated Provider Care Teams.  These Care Teams include your primary Cardiologist (physician) and Advanced Practice Providers (APPs -  Physician Assistants and Nurse Practitioners) who all work together to provide you with the care you need, when you need it.  We recommend signing up for the patient portal called "MyChart".  Sign up information is provided on this After Visit Summary.  MyChart is used to connect with patients for Virtual Visits (Telemedicine).  Patients are able to view lab/test results, encounter notes, upcoming appointments, etc.  Non-urgent messages can be sent to your provider as well.   To learn more about what you can do with MyChart, go to [email or web address].    Your next appointment:   1 year(s)  Provider:   Olga Millers, MD

## 2023-10-13 NOTE — Progress Notes (Signed)
Office Visit    Patient Name: Amanda Berry Date of Encounter: 10/13/2023  Primary Care Provider:  Noberto Retort, MD Primary Cardiologist:  Olga Millers, MD  Chief Complaint    89 year old female with a history of paroxysmal atrial fibrillation on Eliquis, mild aortic valve stenosis, hypertension, and symptomatic anemia who presents for follow-up related to atrial fibrillation and hypertension.    Past Medical History    Past Medical History:  Diagnosis Date   Constipation    Fracture of femur, right, closed (HCC) 2017   Hypertension    Overactive bladder    Symptomatic anemia 03/2016   Past Surgical History:  Procedure Laterality Date   ABDOMINAL HYSTERECTOMY     COLONOSCOPY N/A 10/16/2021   Procedure: COLONOSCOPY;  Surgeon: Kathi Der, MD;  Location: [facility];  Service: Gastroenterology;  Laterality: N/A;   ESOPHAGOGASTRODUODENOSCOPY N/A 10/16/2021   Procedure: ESOPHAGOGASTRODUODENOSCOPY (EGD);  Surgeon: Kathi Der, MD;  Location: [facility];  Service: Gastroenterology;  Laterality: N/A;   HEMOSTASIS CLIP PLACEMENT  10/16/2021   Procedure: HEMOSTASIS CLIP PLACEMENT;  Surgeon: Kathi Der, MD;  Location: [facility];  Service: Gastroenterology;;   KNEE ARTHROPLASTY Right    approx 10 years ago   ORIF PERIPROSTHETIC FRACTURE Right 04/12/2016   Procedure: OPEN REDUCTION INTERNAL FIXATION (ORIF) RIGHT DISTAL FEMUR  PERIPROSTHETIC FRACTURE;  Surgeon: Durene Romans, MD;  Location: [facility];  Service: Orthopedics;  Laterality: Right;   POLYPECTOMY  10/16/2021   Procedure: POLYPECTOMY;  Surgeon: Kathi Der, MD;  Location: [facility];  Service: Gastroenterology;;   ROTATOR CUFF REPAIR Right 1990's    Allergies  No Known Allergies   Labs/Other Studies Reviewed    The following studies were reviewed today:  Cardiac Studies & Procedures    ______________________________________________________________________________________________     ECHOCARDIOGRAM  ECHOCARDIOGRAM COMPLETE 09/22/2022  Narrative ECHOCARDIOGRAM REPORT    Patient Name:   Amanda Berry Date of Exam: 09/22/2022 Medical Rec #:  [id number]      Height:       60.0 in Accession #:    [id number]     Weight:       156.0 lb Date of Birth:  1934/06/12       BSA:          1.680 m Patient Age:    88 years       BP:           140/72 mmHg Patient Gender: F              HR:           70 bpm. Exam Location:  [address]  Procedure: 2D Echo, Cardiac Doppler and Color Doppler  Indications:    I35.0 Nonrheumatic aortic (valve) stenosis  History:        Patient has prior history of Echocardiogram examinations, most recent 02/23/2021. Arrythmias:Atrial Fibrillation; Risk Factors:Hypertension. Anemia.  Sonographer:    Cathie Beams RCS Referring Phys: Petra Kuba Sparrow Sanzo  IMPRESSIONS   1. Left ventricular ejection fraction, by estimation, is 60 to 65%. The left ventricle has normal function. The left ventricle has no regional wall motion abnormalities. There is mild concentric left ventricular hypertrophy. Left ventricular diastolic parameters are indeterminate. 2. Right ventricular systolic function is normal. The right ventricular size is normal. 3. The mitral valve is normal in structure. No evidence of mitral valve regurgitation. No evidence of mitral stenosis. 4. The aortic valve is normal in structure. Aortic valve regurgitation is not visualized. No aortic stenosis is  present. 5. The inferior vena cava is normal in size with greater than 50% respiratory variability, suggesting right atrial pressure of 3 mmHg.  FINDINGS Left Ventricle: Left ventricular ejection fraction, by estimation, is 60 to 65%. The left ventricle has normal function. The left ventricle has no regional wall motion abnormalities. The left ventricular internal cavity size was normal in size.  There is mild concentric left ventricular hypertrophy. Left ventricular diastolic parameters are indeterminate.  Right Ventricle: The right ventricular size is normal. No increase in right ventricular wall thickness. Right ventricular systolic function is normal.  Left Atrium: Left atrial size was normal in size.  Right Atrium: Right atrial size was normal in size.  Pericardium: There is no evidence of pericardial effusion. Presence of epicardial fat layer.  Mitral Valve: The mitral valve is normal in structure. No evidence of mitral valve regurgitation. No evidence of mitral valve stenosis.  Tricuspid Valve: The tricuspid valve is normal in structure. Tricuspid valve regurgitation is mild . No evidence of tricuspid stenosis.  Aortic Valve: The aortic valve is normal in structure. Aortic valve regurgitation is not visualized. No aortic stenosis is present.  Pulmonic Valve: The pulmonic valve was normal in structure. Pulmonic valve regurgitation is not visualized. No evidence of pulmonic stenosis.  Aorta: The aortic root is normal in size and structure.  Venous: The inferior vena cava is normal in size with greater than 50% respiratory variability, suggesting right atrial pressure of 3 mmHg.  IAS/Shunts: No atrial level shunt detected by color flow Doppler.   LEFT VENTRICLE PLAX 2D LVIDd:         3.20 cm   Diastology LVIDs:         1.90 cm   LV e' medial:    8.16 cm/s LV PW:         1.20 cm   LV E/e' medial:  6.2 LV IVS:        1.20 cm   LV e' lateral:   8.49 cm/s LVOT diam:     1.90 cm   LV E/e' lateral: 6.0 LV SV:         63 LV SV Index:   38 LVOT Area:     2.84 cm   RIGHT VENTRICLE RV Basal diam:  2.70 cm RV S prime:     17.40 cm/s TAPSE (M-mode): 2.0 cm RVSP:           24.2 mmHg  LEFT ATRIUM             Index        RIGHT ATRIUM           Index LA diam:        3.60 cm 2.14 cm/m   RA Pressure: 3.00 mmHg LA Vol (A2C):   40.1 ml 23.88 ml/m  RA Area:     11.50 cm LA  Vol (A4C):   27.2 ml 16.19 ml/m  RA Volume:   24.20 ml  14.41 ml/m LA Biplane Vol: 33.0 ml 19.65 ml/m AORTIC VALVE LVOT Vmax:   128.00 cm/s LVOT Vmean:  72.300 cm/s LVOT VTI:    0.223 m  AORTA Ao Root diam: 2.50 cm Ao Asc diam:  2.50 cm  MITRAL VALVE               TRICUSPID VALVE MV Area (PHT): 1.97 cm    TR Peak grad:   21.2 mmHg MV Decel Time: 385 msec    TR Vmax:  230.00 cm/s MV E velocity: 50.80 cm/s  Estimated RAP:  3.00 mmHg MV A velocity: 79.70 cm/s  RVSP:           24.2 mmHg MV E/A ratio:  0.64 SHUNTS Systemic VTI:  0.22 m Systemic Diam: 1.90 cm  Kardie Tobb DO Electronically signed by Thomasene Ripple DO Signature Date/Time: 09/22/2022/4:05:46 PM    Final          ______________________________________________________________________________________________     Recent Labs: No results found for requested labs within last 365 days.  Recent Lipid Panel No results found for: "CHOL", "TRIG", "HDL", "CHOLHDL", "VLDL", "LDLCALC", "LDLDIRECT"  History of Present Illness    89 year old female with the above past medical history including paroxysmal atrial fibrillation on Eliquis, mild aortic valve stenosis, hypertension, and symptomatic anemia.   She was hospitalized in May 2020 with respiratory failure and was treated for pneumonia/sepsis.  Her hospital course was complicated by atrial fibrillation and renal insufficiency.  She was started on diltiazem and Eliquis. Echocardiogram in 01/2019 showed evidence of mild aortic stenosis. Repeat echocardiogram in June 2022 showed normal LV function, mild right atrial enlargement, no evidence of aortic valve stenosis.  She was hospitalized in February 2023 in the setting of symptomatic anemia.  EGD and colonoscopy were overall stable.  She received 2 units PRBC. Chlorthalidone was restarted in 06/2022 in the setting of elevated BP.  Repeat echocardiogram in 08/2022 in setting of dyspnea on exertion, generalized fatigue showed  EF 60-65%, normal LV function, mild concentric LVH, no significant valvular abnormalities.  Her hemoglobin was stable at 10.5.  Her BP was elevated and she was started on valsartan 80 mg daily.  She was last seen in the office on 10/11/2022 and was stable overall from a cardiac standpoint.  She noted improvement in her shortness of breath and fatigue, her BP was well-controlled.   She presents today for follow-up. Since her last visit she has been stable from a cardiac standpoint.  Unfortunately, shortly after I saw her last year, her husband died following a car accident. She reports good support from her family and church.  She will note occasional mild shortness of breath with activity, overall improved.  She denies any significant dyspnea, denies chest pain, palpitations, dizziness, edema, PND, orthopnea, weight gain.  BP has been stable.  Overall, she reports feeling well.    Home Medications    Current Outpatient Medications  Medication Sig Dispense Refill   acetaminophen (TYLENOL) 500 MG tablet Take 1,000 mg by mouth every 6 (six) hours as needed for mild pain.     apixaban (ELIQUIS) 5 MG TABS tablet Take 1 tablet (5 mg total) by mouth 2 (two) times daily. 60 tablet 1   benzonatate (TESSALON) 100 MG capsule Take 100 mg by mouth 3 (three) times daily as needed.     chlorthalidone (HYGROTON) 25 MG tablet Take 1 tablet by mouth once daily 30 tablet 1   Cholecalciferol (VITAMIN D3 PO) Take by mouth daily.     diltiazem (CARTIA XT) 240 MG 24 hr capsule Take 1 capsule by mouth once daily 90 capsule 3   Menthol, Topical Analgesic, (BIOFREEZE EX) Apply 1 application topically as needed (Arthritis pain).     traZODone (DESYREL) 50 MG tablet Take 50 mg by mouth at bedtime as needed.     valsartan (DIOVAN) 80 MG tablet Take 1 tablet by mouth once daily 90 tablet 0   calcium carbonate (OS-CAL) 600 MG TABS tablet Take 600 mg by mouth daily  with breakfast.     No current facility-administered medications  for this visit.     Review of Systems    She denies chest pain, palpitations, dyspnea, pnd, orthopnea, n, v, dizziness, syncope, edema, weight gain, or early satiety. All other systems reviewed and are otherwise negative except as noted above.   Physical Exam    VS:  BP 124/72   Pulse (!) 59   Ht 4\' 11"  (1.499 m)   Wt 137 lb (62.1 kg)   SpO2 97%   BMI 27.67 kg/m   GEN: Well nourished, well developed, in no acute distress. HEENT: normal. Neck: Supple, no JVD, carotid bruits, or masses. Cardiac: RRR, no murmurs, rubs, or gallops. No clubbing, cyanosis, edema.  Radials/DP/PT 2+ and equal bilaterally.  Respiratory:  Respirations regular and unlabored, clear to auscultation bilaterally. GI: Soft, nontender, nondistended, BS + x 4. MS: no deformity or atrophy. Skin: warm and dry, no rash. Neuro:  Strength and sensation are intact. Psych: Normal affect.  Accessory Clinical Findings    ECG personally reviewed by me today - EKG Interpretation Date/Time:  Thursday October 13 2023 10:39:05 EST Ventricular Rate:  59 PR Interval:  168 QRS Duration:  72 QT Interval:  402 QTC Calculation: 397 R Axis:   22  Text Interpretation: Sinus bradycardia with sinus arrhythmia Nonspecific ST abnormality When compared with ECG of 13-Oct-2021 16:32, PREVIOUS ECG IS PRESENT Confirmed by Bernadene Person ([id number]) on 10/13/2023 10:50:15 AM  - no acute changes.   Lab Results  Component Value Date   WBC 6.9 08/20/2022   HGB 10.5 (L) 08/20/2022   HCT 32.9 (L) 08/20/2022   MCV 83 08/20/2022   PLT 479 (H) 08/20/2022   Lab Results  Component Value Date   CREATININE 1.00 09/03/2022   BUN 17 09/03/2022   NA 142 09/03/2022   K 4.3 09/03/2022   CL 102 09/03/2022   CO2 24 09/03/2022   Lab Results  Component Value Date   ALT 13 10/13/2021   AST 19 10/13/2021   ALKPHOS 54 10/13/2021   BILITOT 0.4 10/13/2021   No results found for: "CHOL", "HDL", "LDLCALC", "LDLDIRECT", "TRIG", "CHOLHDL"  No results  found for: "HGBA1C"  Assessment & Plan   1. Hypertension: BP well controlled. Continue current antihypertensive regimen.    2. Paroxysmal atrial fibrillation: Maintaining NSR. Continue Diltiazem, Eliquis. She will have labs (CBC, BMET) drawn with her PCP at her upcoming physical in April 2025.  She deferred any lab work today.   3. Aortic stenosis/shortness of breath: AS not appreciated on most recent echo in 08/2022.  Shortness of breath has greatly improved. Euvolemic and well compensated on exam. Consider repeat echocardiogram as clinically indicated.   4. Anemia: CBC was stable at 11.4 in 05/2023.  Monitored and managed per PCP.   5. Disposition: Follow-up in 1 year.      Joylene Grapes, NP 10/13/2023, 12:31 PM

## 2023-11-10 ENCOUNTER — Other Ambulatory Visit: Payer: Self-pay | Admitting: Nurse Practitioner

## 2023-11-15 ENCOUNTER — Other Ambulatory Visit: Payer: Self-pay | Admitting: Cardiology

## 2023-12-06 DIAGNOSIS — I480 Paroxysmal atrial fibrillation: Secondary | ICD-10-CM | POA: Diagnosis not present

## 2023-12-06 DIAGNOSIS — I10 Essential (primary) hypertension: Secondary | ICD-10-CM | POA: Diagnosis not present

## 2023-12-06 DIAGNOSIS — R202 Paresthesia of skin: Secondary | ICD-10-CM | POA: Diagnosis not present

## 2023-12-06 DIAGNOSIS — G4700 Insomnia, unspecified: Secondary | ICD-10-CM | POA: Diagnosis not present

## 2023-12-06 DIAGNOSIS — D6869 Other thrombophilia: Secondary | ICD-10-CM | POA: Diagnosis not present

## 2023-12-06 DIAGNOSIS — D509 Iron deficiency anemia, unspecified: Secondary | ICD-10-CM | POA: Diagnosis not present

## 2023-12-06 DIAGNOSIS — E7800 Pure hypercholesterolemia, unspecified: Secondary | ICD-10-CM | POA: Diagnosis not present

## 2023-12-06 DIAGNOSIS — Z0000 Encounter for general adult medical examination without abnormal findings: Secondary | ICD-10-CM | POA: Diagnosis not present

## 2024-04-23 ENCOUNTER — Other Ambulatory Visit: Payer: Self-pay | Admitting: Cardiology

## 2024-05-23 DIAGNOSIS — L03116 Cellulitis of left lower limb: Secondary | ICD-10-CM | POA: Diagnosis not present

## 2024-06-13 DIAGNOSIS — E7800 Pure hypercholesterolemia, unspecified: Secondary | ICD-10-CM | POA: Diagnosis not present

## 2024-06-13 DIAGNOSIS — G629 Polyneuropathy, unspecified: Secondary | ICD-10-CM | POA: Diagnosis not present

## 2024-06-13 DIAGNOSIS — D509 Iron deficiency anemia, unspecified: Secondary | ICD-10-CM | POA: Diagnosis not present

## 2024-06-13 DIAGNOSIS — D6869 Other thrombophilia: Secondary | ICD-10-CM | POA: Diagnosis not present

## 2024-06-13 DIAGNOSIS — Z23 Encounter for immunization: Secondary | ICD-10-CM | POA: Diagnosis not present

## 2024-06-13 DIAGNOSIS — H6123 Impacted cerumen, bilateral: Secondary | ICD-10-CM | POA: Diagnosis not present

## 2024-06-13 DIAGNOSIS — I480 Paroxysmal atrial fibrillation: Secondary | ICD-10-CM | POA: Diagnosis not present

## 2024-06-13 DIAGNOSIS — G4700 Insomnia, unspecified: Secondary | ICD-10-CM | POA: Diagnosis not present

## 2024-06-13 DIAGNOSIS — I10 Essential (primary) hypertension: Secondary | ICD-10-CM | POA: Diagnosis not present

## 2024-06-27 DIAGNOSIS — D649 Anemia, unspecified: Secondary | ICD-10-CM | POA: Diagnosis not present

## 2024-07-02 ENCOUNTER — Encounter (INDEPENDENT_AMBULATORY_CARE_PROVIDER_SITE_OTHER): Payer: Self-pay | Admitting: Physician Assistant

## 2024-07-02 ENCOUNTER — Ambulatory Visit (INDEPENDENT_AMBULATORY_CARE_PROVIDER_SITE_OTHER): Admitting: Physician Assistant

## 2024-07-02 VITALS — BP 135/53 | HR 76 | Ht 59.0 in | Wt 145.0 lb

## 2024-07-02 DIAGNOSIS — H6121 Impacted cerumen, right ear: Secondary | ICD-10-CM | POA: Diagnosis not present

## 2024-07-02 DIAGNOSIS — H6123 Impacted cerumen, bilateral: Secondary | ICD-10-CM

## 2024-07-02 DIAGNOSIS — H9193 Unspecified hearing loss, bilateral: Secondary | ICD-10-CM

## 2024-07-02 DIAGNOSIS — D649 Anemia, unspecified: Secondary | ICD-10-CM | POA: Diagnosis not present

## 2024-07-02 NOTE — Progress Notes (Unsigned)
 Patient is having BP managed.

## 2024-07-03 NOTE — Progress Notes (Signed)
 Dear Dr. Arloa, Here is my assessment for our mutual patient, Amanda Berry. Thank you for allowing me the opportunity to care for your patient. Please do not hesitate to contact me should you have any other questions. Sincerely, Chyrl Cohen PA-C  Otolaryngology Clinic Note Referring provider: Dr. Arloa HPI:  Amanda Berry is a 89 y.o. female kindly referred by Dr. Arloa   Discussed the use of AI scribe software for clinical note transcription with the patient, who gave verbal consent to proceed.  History of Present Illness    Amanda Berry is an 89 year old female who presents with earwax impaction and hearing difficulties. She is accompanied by her daughter, Mliss. She has been experiencing earwax buildup, particularly in her right ear, for an unspecified duration. The patient reports that during routine visits every six months, she is often told there is some wax in her ears. Recent attempts to remove the wax through irrigation were unsuccessful, leading to a referral for further management.  The presence of earwax has been associated with hearing difficulties, which improve once the wax is removed. This is the first time she has undergone irrigation for wax removal, and she found the procedure uncomfortable, with sensations of buzzing and burning when water was used.  She reports no history of ear infections, trauma, surgeries, or ear drainage. She acknowledges age-related hearing loss but is uncertain about its extent. She wants to hear better, noting improvement after wax removal.  She reports no allergies and no other ear-related symptoms aside from wax buildup.          Independent Review of Additional Tests or Records:  none   PMH/Meds/All/SocHx/FamHx/ROS:   Past Medical History:  Diagnosis Date   Constipation    Fracture of femur, right, closed (HCC) 2017   Hypertension    Overactive bladder    Symptomatic anemia 03/2016     Past Surgical History:  Procedure  Laterality Date   ABDOMINAL HYSTERECTOMY     COLONOSCOPY N/A 10/16/2021   Procedure: COLONOSCOPY;  Surgeon: Elicia Claw, MD;  Location: [facility];  Service: Gastroenterology;  Laterality: N/A;   ESOPHAGOGASTRODUODENOSCOPY N/A 10/16/2021   Procedure: ESOPHAGOGASTRODUODENOSCOPY (EGD);  Surgeon: Elicia Claw, MD;  Location: [facility] ENDOSCOPY;  Service: Gastroenterology;  Laterality: N/A;   HEMOSTASIS CLIP PLACEMENT  10/16/2021   Procedure: HEMOSTASIS CLIP PLACEMENT;  Surgeon: Elicia Claw, MD;  Location: [facility];  Service: Gastroenterology;;   KNEE ARTHROPLASTY Right    approx 10 years ago   ORIF PERIPROSTHETIC FRACTURE Right 04/12/2016   Procedure: OPEN REDUCTION INTERNAL FIXATION (ORIF) RIGHT DISTAL FEMUR  PERIPROSTHETIC FRACTURE;  Surgeon: Donnice Car, MD;  Location: [facility];  Service: Orthopedics;  Laterality: Right;   POLYPECTOMY  10/16/2021   Procedure: POLYPECTOMY;  Surgeon: Elicia Claw, MD;  Location: [facility];  Service: Gastroenterology;;   ROTATOR CUFF REPAIR Right 1990's    Family History  Problem Relation Age of Onset   Hypertension Mother    Colon cancer Father    Valvular heart disease Sister    Lung cancer Sister    Breast cancer Maternal Grandmother 21   Kidney disease Brother    Alcoholism Brother    BRCA 1/2 Neg Hx      Social Connections: Not on file      Current Outpatient Medications:    acetaminophen  (TYLENOL ) 500 MG tablet, Take 1,000 mg by mouth every 6 (six) hours as needed for mild pain., Disp: , Rfl:    apixaban  (ELIQUIS ) 5 MG TABS tablet,  Take 1 tablet (5 mg total) by mouth 2 (two) times daily., Disp: 60 tablet, Rfl: 1   benzonatate (TESSALON) 100 MG capsule, Take 100 mg by mouth 3 (three) times daily as needed., Disp: , Rfl:    cephALEXin (KEFLEX) 500 MG capsule, Take 500 mg by mouth 3 (three) times daily., Disp: , Rfl:    chlorthalidone  (HYGROTON ) 25 MG tablet, Take 1 tablet by mouth once daily, Disp: 90 tablet, Rfl: 3    Cholecalciferol (VITAMIN D3 PO), Take by mouth daily., Disp: , Rfl:    diltiazem  (CARDIZEM  CD) 240 MG 24 hr capsule, Take 1 capsule by mouth once daily, Disp: 90 capsule, Rfl: 2   gabapentin (NEURONTIN) 100 MG capsule, Take 100 mg by mouth daily., Disp: , Rfl:    Menthol , Topical Analgesic, (BIOFREEZE EX), Apply 1 application topically as needed (Arthritis pain)., Disp: , Rfl:    pantoprazole  (PROTONIX ) 40 MG tablet, Take 40 mg by mouth daily., Disp: , Rfl:    traZODone (DESYREL) 50 MG tablet, Take 50 mg by mouth at bedtime as needed., Disp: , Rfl:    valsartan  (DIOVAN ) 80 MG tablet, Take 1 tablet by mouth once daily, Disp: 90 tablet, Rfl: 3   calcium carbonate (OS-CAL) 600 MG TABS tablet, Take 600 mg by mouth daily with breakfast., Disp: , Rfl:    Physical Exam:   BP (!) 135/53   Pulse 76   Ht 4' 11 (1.499 m)   Wt 145 lb (65.8 kg)   SpO2 95%   BMI 29.29 kg/m   Pertinent Findings  CN II-XII grossly intact Right EAC with cerumen impaction, left EAC clear, TM intact with well-pneumatized middle ear space Anterior rhinoscopy: Septum midline; bilateral inferior turbinates with no hypertrophy No lesions of oral cavity/oropharynx;  No obviously palpable neck masses/lymphadenopathy/thyromegaly No respiratory distress or stridor   Seprately Identifiable Procedures:  Procedure: bilateral ear microscopy and cerumen removal using microscope (CPT [phone]) - Mod 25 Pre-procedure diagnosis: unilateral cerumen impaction right external auditory canal Post-procedure diagnosis: same Indication: bilateral cerumen impaction; given patient's otologic complaints and history as well as for improved and comprehensive examination of external ear and tympanic membrane, bilateral otologic examination using microscope was performed and impacted cerumen removed  Procedure: Patient was placed semi-recumbent. Both ear canals were examined using the microscope with findings above. Cerumen removed from the right  external auditory canal using suction and currette with improvement in EAC examination and patency. Left: EAC was patent. TM was intact . Middle ear was aerated. Drainage: none Right: EAC was patent. TM was intact . Middle ear was aerated . Drainage: none Patient tolerated the procedure well.   Impression & Plans:  Leydy Worthey is a 89 y.o. female with the following   Assessment and Plan     impacted cerumen Significant cerumen accumulation, especially in the right ear, causing hearing difficulties.  - Advise return for cerumen removal if wax accumulation recurs - Monitor for signs of ear infection or drainage and report if symptoms develop.  Hearing loss evaluation Hearing improved post-cerumen removal. Formal hearing test recommended to assess extent of hearing loss and need for hearing aids. - Schedule audiological evaluation        - f/u follow-up after audiological evaluation, will call with results   Thank you for allowing me the opportunity to care for your patient. Please do not hesitate to contact me should you have any other questions.  Sincerely, Chyrl Cohen PA-C [facility] Phone: [phone] Fax: [phone]  07/03/2024, 1:05 PM

## 2024-07-24 NOTE — Progress Notes (Signed)
 [facility]   [phone] [phone]) [phone]   Clinic New consult Note   Patient Care Team: Arloa Elsie SAUNDERS, MD as PCP - General (Family Medicine) Pietro Redell RAMAN, MD as PCP - Cardiology (Cardiology)  07/25/2024  CHIEF COMPLAINTS/PURPOSE OF CONSULTATION:  Iron deficiency anemia and possible need for iron infusions   HISTORY OF PRESENTING ILLNESS:  Amanda Berry 89 y.o. female with history of htn and gi bleed. She was referred by Charmaine Meissner, APRN, from [facility]. She is here because of iron deficiency anemia. Labs drawn on 06/13/2024 showed hemoglobin 8.0 and ferritin of 4.6. She is currently on Eliquis  due to atrial fibrillation. This is well managed. She sees her cardiologist only once per year. She was found to have abnormal CBC from 06/13/2024. She denies recent chest pain on exertion, shortness of breath on minimal exertion, pre-syncopal episodes, or palpitations. She has noted increased fatigue and some dizziness, especially upon standing. She also has chronic neuropathy and cold sensation in her hands. She had not noticed any recent bleeding such as epistaxis or hematuria. She initially noted melena and had a few black, tarry stools. She states that this has resolved. The patient denies over the counter NSAID ingestion. She is on Eliquis  due to A-Fib. Her last colonoscopy was 10/16/2021. She did have 3 polyps removed and diverticulosis was noted. She also had internal, non-bleeding hemorrhoids. No repeat colonoscopy was recommended at the time. She had upper endoscopy on the same date. She did have evidence of gastritis and duodenitis. She continues to see her GI provider. She had no prior history or diagnosis of cancer. Her age appropriate screening programs are up-to-date. She denies any pica and eats a variety of diet. She never donated blood or received blood transfusion. The patient was prescribed oral iron supplements and she takes iron supplement  every day and has since blood work was done in October 2025.  Socially, the patient is married and lives in [place], [place]. She has 3 adult children. She no longer works. She denies personal history of cancer. She states that her dad and paternal grandmother both had colon cancer. She denies smoking. She does not drink. She odes not use recreational drugs of any kind.   REVIEW OF SYSTEMS:   Constitutional: Denies fevers, chills or abnormal night sweats Eyes: Denies blurriness of vision, double vision or watery eyes Ears, nose, mouth, throat, and face: Denies mucositis or sore throat Respiratory: Denies cough, dyspnea or wheezes Cardiovascular: Denies palpitation, chest discomfort or lower extremity swelling Gastrointestinal:  Denies nausea, heartburn or change in bowel habits Skin: Denies abnormal skin rashes Lymphatics: Denies new lymphadenopathy or easy bruising Neurological:Denies numbness, tingling or new weaknesses Behavioral/Psych: Mood is stable, no new changes   All other systems were reviewed with the patient and are negative.   MEDICAL HISTORY:  Past Medical History:  Diagnosis Date   Constipation    Fracture of femur, right, closed (HCC) 2017   Hypertension    Overactive bladder    Symptomatic anemia 03/2016    SURGICAL HISTORY: Past Surgical History:  Procedure Laterality Date   ABDOMINAL HYSTERECTOMY     COLONOSCOPY N/A 10/16/2021   Procedure: COLONOSCOPY;  Surgeon: Elicia Claw, MD;  Location: [facility] ENDOSCOPY;  Service: Gastroenterology;  Laterality: N/A;   ESOPHAGOGASTRODUODENOSCOPY N/A 10/16/2021   Procedure: ESOPHAGOGASTRODUODENOSCOPY (EGD);  Surgeon: Elicia Claw, MD;  Location: [facility] ENDOSCOPY;  Service: Gastroenterology;  Laterality: N/A;   HEMOSTASIS CLIP PLACEMENT  10/16/2021   Procedure: HEMOSTASIS CLIP  PLACEMENT;  Surgeon: Elicia Claw, MD;  Location: [facility] ENDOSCOPY;  Service: Gastroenterology;;   KNEE ARTHROPLASTY Right    approx 10 years ago    ORIF PERIPROSTHETIC FRACTURE Right 04/12/2016   Procedure: OPEN REDUCTION INTERNAL FIXATION (ORIF) RIGHT DISTAL FEMUR  PERIPROSTHETIC FRACTURE;  Surgeon: Donnice Car, MD;  Location: [facility] ORS;  Service: Orthopedics;  Laterality: Right;   POLYPECTOMY  10/16/2021   Procedure: POLYPECTOMY;  Surgeon: Elicia Claw, MD;  Location: [facility] ENDOSCOPY;  Service: Gastroenterology;;   ROTATOR CUFF REPAIR Right 1990's    SOCIAL HISTORY: Social History   Socioeconomic History   Marital status: Married    Spouse name: Not on file   Number of children: 3   Years of education: Not on file   Highest education level: Not on file  Occupational History   Not on file  Tobacco Use   Smoking status: Never   Smokeless tobacco: Never  Substance and Sexual Activity   Alcohol use: No   Drug use: No   Sexual activity: Not on file  Other Topics Concern   Not on file  Social History Narrative   Not on file   Social Drivers of Health   Financial Resource Strain: Not on file  Food Insecurity: No Food Insecurity (07/25/2024)   Hunger Vital Sign    Worried About Running Out of Food in the Last Year: Never true    Ran Out of Food in the Last Year: Never true  Transportation Needs: No Transportation Needs (07/25/2024)   PRAPARE - Administrator, Civil Service (Medical): No    Lack of Transportation (Non-Medical): No  Physical Activity: Not on file  Stress: Not on file  Social Connections: Not on file  Intimate Partner Violence: Not At Risk (07/25/2024)   Humiliation, Afraid, Rape, and Kick questionnaire    Fear of Current or Ex-Partner: No    Emotionally Abused: No    Physically Abused: No    Sexually Abused: No    FAMILY HISTORY: Family History  Problem Relation Age of Onset   Hypertension Mother    Colon cancer Father    Valvular heart disease Sister    Lung cancer Sister    Breast cancer Maternal Grandmother 69   Kidney disease Brother    Alcoholism Brother    BRCA 1/2 Neg Hx      ALLERGIES:  has no known allergies.  MEDICATIONS:  Current Outpatient Medications  Medication Sig Dispense Refill   acetaminophen  (TYLENOL ) 500 MG tablet Take 1,000 mg by mouth every 6 (six) hours as needed for mild pain.     apixaban  (ELIQUIS ) 5 MG TABS tablet Take 1 tablet (5 mg total) by mouth 2 (two) times daily. 60 tablet 1   chlorthalidone  (HYGROTON ) 25 MG tablet Take 1 tablet by mouth once daily 90 tablet 3   Cholecalciferol (VITAMIN D3 PO) Take by mouth daily.     diltiazem  (CARDIZEM  CD) 240 MG 24 hr capsule Take 1 capsule by mouth once daily 90 capsule 2   gabapentin (NEURONTIN) 100 MG capsule Take 100 mg by mouth daily.     Menthol , Topical Analgesic, (BIOFREEZE EX) Apply 1 application topically as needed (Arthritis pain).     pantoprazole  (PROTONIX ) 40 MG tablet Take 40 mg by mouth daily.     traZODone (DESYREL) 50 MG tablet Take 50 mg by mouth at bedtime as needed.     valsartan  (DIOVAN ) 80 MG tablet Take 1 tablet by mouth once daily 90 tablet 3  benzonatate (TESSALON) 100 MG capsule Take 100 mg by mouth 3 (three) times daily as needed.     calcium carbonate (OS-CAL) 600 MG TABS tablet Take 600 mg by mouth daily with breakfast.     cephALEXin (KEFLEX) 500 MG capsule Take 500 mg by mouth 3 (three) times daily.     No current facility-administered medications for this visit.    PHYSICAL EXAMINATION: ECOG PERFORMANCE STATUS: 1 - Symptomatic but completely ambulatory  Vitals:   07/25/24 1029 07/25/24 1036  BP: (!) 141/60 (!) 134/52  Pulse: 70   Resp: 16   Temp: (!) 97.2 F (36.2 C)   SpO2: 99%    Filed Weights   07/25/24 1029  Weight: 142 lb 4.8 oz (64.5 kg)    GENERAL:alert, no distress and comfortable SKIN: skin color, texture, turgor are normal, no rashes or significant lesions EYES: normal, conjunctiva are pink and non-injected, sclera clear OROPHARYNX:no exudate, no erythema and lips, buccal mucosa, and tongue normal  NECK: supple, thyroid  normal size,  non-tender, without nodularity LYMPH:  no palpable lymphadenopathy in the cervical, axillary or inguinal LUNGS: clear to auscultation and percussion with normal breathing effort HEART: regular rate & rhythm and no murmurs and no lower extremity edema ABDOMEN:abdomen soft, non-tender and normal bowel sounds Musculoskeletal:no cyanosis of digits and no clubbing  PSYCH: alert & oriented x 3 with fluent speech NEURO: no focal motor/sensory deficits  LABORATORY DATA:  I have reviewed the data as listed    Latest Ref Rng & Units 07/25/2024   11:06 AM 08/20/2022   11:18 AM 10/15/2021    1:46 AM  CBC  WBC 4.0 - 10.5 K/uL 4.1  6.9  7.7   Hemoglobin 12.0 - 15.0 g/dL 8.7  89.4  9.2   Hematocrit 36.0 - 46.0 % 30.6  32.9  30.4   Platelets 150 - 400 K/uL 267  479  265        Latest Ref Rng & Units 09/03/2022   10:19 AM 07/19/2022   10:58 AM 10/15/2021    1:46 AM  CMP  Glucose 70 - 99 mg/dL 69  80    BUN 8 - 27 mg/dL 17  22    Creatinine 9.42 - 1.00 mg/dL 8.99  9.04    Sodium 865 - 144 mmol/L 142  139    Potassium 3.5 - 5.2 mmol/L 4.3  3.7  4.4   Chloride 96 - 106 mmol/L 102  100    CO2 20 - 29 mmol/L 24  23    Calcium 8.7 - 10.3 mg/dL 9.3  9.7      Assessment and Plan  Symptomatic anemia Assessment & Plan: Amanda Berry 89 y.o. female with history of htn and gi bleed. She was referred by Charmaine Meissner, APRN, from [facility]. She is here because of iron deficiency anemia. Labs drawn on 06/13/2024 showed hemoglobin 8.0 and ferritin of 4.6. She is currently on Eliquis  due to atrial fibrillation. The patient has been on oral supplements since then. It is felt that the patient may benefit from iron infusions to improve blood counts and hopefully avoid colonoscopy.  Will recheck labs today, including full iron panel, ferritin, b12, folate, reticulocytes, and blood count. If CBC continues to show low Hgb and low ferritin, will recommend treatment with IV iron. The patient and her family member  understand that IV iron treatments are administered at [facility]. IV iron does have minimal risk of negative side effects, mainly, allergic reactions.  Reactions can consist of general pruritus and rash. Can include full anaphylactic reaction. I do give premedications (Tylenol  and benadryl ) to reduce risk of allergic reactions. The patient voiced understanding of all information and agrees to move forward with treatment.  Will recheck iron panel in three months and again in 6 months with follow up visit.   Orders: -     Vitamin B12; Future -     Folate; Future -     Reticulocytes; Future -     Iron and Iron Binding Capacity ([facility] only); Future -     Ferritin; Future -     CBC with Differential (Cancer Center Only); Future     Orders Placed This Encounter  Procedures   Vitamin B12    Standing Status:   Future    Number of Occurrences:   1    Expected Date:   07/25/2024    Expiration Date:   10/23/2024   Folate    Standing Status:   Future    Number of Occurrences:   1    Expected Date:   07/25/2024    Expiration Date:   10/23/2024   Reticulocytes    Standing Status:   Future    Number of Occurrences:   1    Expected Date:   07/25/2024    Expiration Date:   10/23/2024   Iron and Iron Binding Capacity ([facility] only)    Standing Status:   Future    Number of Occurrences:   1    Expected Date:   07/25/2024    Expiration Date:   10/23/2024   Ferritin    Standing Status:   Future    Number of Occurrences:   1    Expected Date:   07/25/2024    Expiration Date:   10/23/2024   CBC with Differential (Cancer Center Only)    Standing Status:   Future    Number of Occurrences:   1    Expected Date:   07/25/2024    Expiration Date:   10/23/2024   This was a shared visit with Dr. Lanny. All questions were answered. The patient knows to call the clinic with any problems, questions or concerns.      Powell FORBES Lessen, NP 07/25/2024 9:51 AM    Addendum I have  seen the patient, examined her. I agree with the assessment and and plan and have edited the notes.   89 year old female with past medical history of hypertension and GI bleeding, was referred by GI for iron deficient anemia.  Lab from months ago showed hemoglobin 8.0 and ferritin 4.6, consistent with iron deficient anemia.  She had a similar episode of iron deficient anemia in 2023, EGD and colonoscopy was negative for malignancy.  She is on Eliquis  to for atrial fibrillation, which likely contributes to her GI bleeding.  Will repeat labs today, to see if she needs IV iron.  She is on oral iron now.  If her labs today show significant anemia with iron deficiency, we will recommend IV iron.  Will monitor her CBC and iron level closely.  If her anemia does not resolve with adequate iron replacement, we will obtain additional anemia workup.  All questions were answered.  Onita Lanny MD 07/25/2024

## 2024-07-25 ENCOUNTER — Inpatient Hospital Stay

## 2024-07-25 ENCOUNTER — Inpatient Hospital Stay: Admitting: Nurse Practitioner

## 2024-07-25 ENCOUNTER — Inpatient Hospital Stay: Attending: Nurse Practitioner | Admitting: Nurse Practitioner

## 2024-07-25 VITALS — BP 134/52 | HR 70 | Temp 97.2°F | Resp 16 | Wt 142.3 lb

## 2024-07-25 DIAGNOSIS — I4891 Unspecified atrial fibrillation: Secondary | ICD-10-CM | POA: Diagnosis not present

## 2024-07-25 DIAGNOSIS — Z7901 Long term (current) use of anticoagulants: Secondary | ICD-10-CM | POA: Diagnosis not present

## 2024-07-25 DIAGNOSIS — D509 Iron deficiency anemia, unspecified: Secondary | ICD-10-CM | POA: Diagnosis not present

## 2024-07-25 DIAGNOSIS — D649 Anemia, unspecified: Secondary | ICD-10-CM

## 2024-07-25 DIAGNOSIS — Z800 Family history of malignant neoplasm of digestive organs: Secondary | ICD-10-CM | POA: Diagnosis not present

## 2024-07-25 DIAGNOSIS — Z803 Family history of malignant neoplasm of breast: Secondary | ICD-10-CM | POA: Diagnosis not present

## 2024-07-25 DIAGNOSIS — Z801 Family history of malignant neoplasm of trachea, bronchus and lung: Secondary | ICD-10-CM | POA: Diagnosis not present

## 2024-07-25 LAB — CBC WITH DIFFERENTIAL (CANCER CENTER ONLY)
Abs Immature Granulocytes: 0.01 K/uL (ref 0.00–0.07)
Basophils Absolute: 0.1 K/uL (ref 0.0–0.1)
Basophils Relative: 2 %
Eosinophils Absolute: 0.1 K/uL (ref 0.0–0.5)
Eosinophils Relative: 3 %
HCT: 30.6 % — ABNORMAL LOW (ref 36.0–46.0)
Hemoglobin: 8.7 g/dL — ABNORMAL LOW (ref 12.0–15.0)
Immature Granulocytes: 0 %
Lymphocytes Relative: 29 %
Lymphs Abs: 1.2 K/uL (ref 0.7–4.0)
MCH: 22.2 pg — ABNORMAL LOW (ref 26.0–34.0)
MCHC: 28.4 g/dL — ABNORMAL LOW (ref 30.0–36.0)
MCV: 78.1 fL — ABNORMAL LOW (ref 80.0–100.0)
Monocytes Absolute: 0.4 K/uL (ref 0.1–1.0)
Monocytes Relative: 9 %
Neutro Abs: 2.3 K/uL (ref 1.7–7.7)
Neutrophils Relative %: 57 %
Platelet Count: 267 K/uL (ref 150–400)
RBC: 3.92 MIL/uL (ref 3.87–5.11)
RDW: 23.9 % — ABNORMAL HIGH (ref 11.5–15.5)
WBC Count: 4.1 K/uL (ref 4.0–10.5)
nRBC: 0 % (ref 0.0–0.2)

## 2024-07-25 LAB — VITAMIN B12: Vitamin B-12: 305 pg/mL (ref 180–914)

## 2024-07-25 LAB — IRON AND IRON BINDING CAPACITY (CC-WL,HP ONLY)
Iron: 285 ug/dL — ABNORMAL HIGH (ref 28–170)
Saturation Ratios: 61 % — ABNORMAL HIGH (ref 10.4–31.8)
TIBC: 468 ug/dL — ABNORMAL HIGH (ref 250–450)
UIBC: 183 ug/dL

## 2024-07-25 LAB — RETICULOCYTES
Immature Retic Fract: 14.9 % (ref 2.3–15.9)
RBC.: 3.88 MIL/uL (ref 3.87–5.11)
Retic Count, Absolute: 64.8 K/uL (ref 19.0–186.0)
Retic Ct Pct: 1.7 % (ref 0.4–3.1)

## 2024-07-25 LAB — FOLATE: Folate: 17.4 ng/mL (ref >5.9)

## 2024-07-25 LAB — FERRITIN: Ferritin: 28 ng/mL (ref 11–307)

## 2024-07-31 ENCOUNTER — Encounter: Payer: Self-pay | Admitting: Nurse Practitioner

## 2024-07-31 ENCOUNTER — Other Ambulatory Visit: Payer: Self-pay | Admitting: Nurse Practitioner

## 2024-07-31 NOTE — Assessment & Plan Note (Addendum)
 Amanda Berry Donna 89 y.o. female with history of htn and gi bleed. She was referred by Charmaine Meissner, APRN, from [facility]. She is here because of iron deficiency anemia. Labs drawn on 06/13/2024 showed hemoglobin 8.0 and ferritin of 4.6. She is currently on Eliquis  due to atrial fibrillation. The patient has been on oral supplements since then. It is felt that the patient may benefit from iron infusions to improve blood counts and hopefully avoid colonoscopy.  Will recheck labs today, including full iron panel, ferritin, b12, folate, reticulocytes, and blood count. If CBC continues to show low Hgb and low ferritin, will recommend treatment with IV iron. The patient and her family member understand that IV iron treatments are administered at [facility]. IV iron does have minimal risk of negative side effects, mainly, allergic reactions. Reactions can consist of general pruritus and rash. Can include full anaphylactic reaction. I do give premedications (Tylenol  and benadryl ) to reduce risk of allergic reactions. The patient voiced understanding of all information and agrees to move forward with treatment.  Will recheck iron panel in three months and again in 6 months with follow up visit.

## 2024-08-03 ENCOUNTER — Encounter: Payer: Self-pay | Admitting: Nurse Practitioner

## 2024-08-07 ENCOUNTER — Telehealth: Payer: Self-pay

## 2024-08-07 ENCOUNTER — Other Ambulatory Visit (HOSPITAL_COMMUNITY): Payer: Self-pay | Admitting: Nurse Practitioner

## 2024-08-07 ENCOUNTER — Telehealth (HOSPITAL_COMMUNITY): Payer: Self-pay | Admitting: Nurse Practitioner

## 2024-08-07 NOTE — Telephone Encounter (Signed)
 Venofer 200mg  x 5 weekly doses ordered with premeds  Acetaminophen  650mg  PO and diphenhydramine  25mg  PO  Phillip Sandler, PharmD, MPH, BCPS, CPP Clinical Pharmacist

## 2024-08-07 NOTE — Telephone Encounter (Signed)
 Amanda Berry, patient will be scheduled as soon as possible.  Auth Submission: NO AUTH NEEDED Site of care: Site of care: [facility] Payer: [employer] Medication & CPT/J Code(s) submitted: Venofer (Iron Sucrose) J1756 Diagnosis Code:  Route of submission (phone, fax, portal):  Phone # Fax # Auth type: Buy/Bill PB Units/visits requested: 200mg  x 5 doses Reference number:  Approval from: 08/07/24 to 08/29/24

## 2024-08-14 ENCOUNTER — Ambulatory Visit

## 2024-08-14 VITALS — BP 119/69 | HR 51 | Temp 97.5°F | Resp 14 | Ht 59.0 in | Wt 141.0 lb

## 2024-08-14 DIAGNOSIS — D500 Iron deficiency anemia secondary to blood loss (chronic): Secondary | ICD-10-CM

## 2024-08-14 DIAGNOSIS — D649 Anemia, unspecified: Secondary | ICD-10-CM

## 2024-08-14 MED ORDER — ACETAMINOPHEN 325 MG PO TABS
650.0000 mg | ORAL_TABLET | Freq: Once | ORAL | Status: AC
  Administered 2024-08-14: 13:00:00 650 mg via ORAL
  Filled 2024-08-14: qty 2

## 2024-08-14 MED ORDER — DIPHENHYDRAMINE HCL 25 MG PO CAPS
25.0000 mg | ORAL_CAPSULE | Freq: Once | ORAL | Status: AC
  Administered 2024-08-14: 13:00:00 25 mg via ORAL
  Filled 2024-08-14: qty 1

## 2024-08-14 MED ORDER — IRON SUCROSE 200 MG IVPB - SIMPLE MED
200.0000 mg | Freq: Once | Status: AC
  Administered 2024-08-14: 14:00:00 200 mg via INTRAVENOUS
  Filled 2024-08-14: qty 110

## 2024-08-14 NOTE — Progress Notes (Signed)
 Diagnosis: Iron  Deficiency Anemia  Provider:  Mannam, Praveen MD  Procedure: IV Infusion  IV Type: Peripheral, IV Location: R Forearm  Venofer  (Iron  Sucrose), Dose: 200 mg  Infusion Start Time: 1357  Infusion Stop Time: 1412  Post Infusion IV Care: Observation period completed and Peripheral IV Discontinued  Discharge: Condition: Stable, Destination: Home . AVS Declined  Performed by:  Rocky FORBES Sar, RN

## 2024-08-16 ENCOUNTER — Telehealth: Payer: Self-pay

## 2024-08-16 NOTE — Telephone Encounter (Signed)
° °  Pre-operative Risk Assessment    Patient Name: Amanda Berry  DOB: 1933-11-10 MRN: [id number]   Date of last office visit: 10/13/23  Damien Braver Date of next office visit: None   Request for Surgical Clearance    Procedure:  Endoscopy  Date of Surgery:  Clearance 09/19/24                               Surgeon:  Dr. Layla Lah   Surgeon's Group or Practice Name:  [facility] Phone number:  [phone] Fax number:  [phone]   Type of Clearance Requested:   - Medical  - Pharmacy:  Hold Apixaban  (Eliquis ) 2 days prior   Type of Anesthesia:  Propofol    Additional requests/questions:    [name]   08/16/2024, 5:30 PM

## 2024-08-17 ENCOUNTER — Telehealth: Payer: Self-pay

## 2024-08-17 NOTE — Telephone Encounter (Signed)
 Preop tele appt now scheduled, med rec and consent done.

## 2024-08-17 NOTE — Telephone Encounter (Signed)
" ° °  Name: Amanda Berry  DOB: 1934-01-21  MRN: [id number]  Primary Cardiologist: Redell Shallow, MD   Preoperative team, please contact this patient and set up a phone call appointment for further preoperative risk assessment. Please obtain consent and complete medication review. Thank you for your help.  I confirm that guidance regarding antiplatelet and oral anticoagulation therapy has been completed and, if necessary, noted below.  I also confirmed the patient resides in the state of [place] . As per [facility] Medical Board telemedicine laws, the patient must reside in the state in which the provider is licensed.   [name] KATHEE Pizza, FNP 08/17/2024, 10:02 AM [facility]    "

## 2024-08-17 NOTE — Telephone Encounter (Signed)
"  °  Patient Consent for Virtual Visit        Amanda Berry has provided verbal consent on 08/17/2024 for a virtual visit (video or telephone).   CONSENT FOR VIRTUAL VISIT FOR:  Amanda Berry  By participating in this virtual visit I agree to the following:  I hereby voluntarily request, consent and authorize [facility] and its employed or contracted physicians, physician assistants, nurse practitioners or other licensed health care professionals (the Practitioner), to provide me with telemedicine health care services (the Services) as deemed necessary by the treating Practitioner. I acknowledge and consent to receive the Services by the Practitioner via telemedicine. I understand that the telemedicine visit will involve communicating with the Practitioner through live audiovisual communication technology and the disclosure of certain medical information by electronic transmission. I acknowledge that I have been given the opportunity to request an in-person assessment or other available alternative prior to the telemedicine visit and am voluntarily participating in the telemedicine visit.  I understand that I have the right to withhold or withdraw my consent to the use of telemedicine in the course of my care at any time, without affecting my right to future care or treatment, and that the Practitioner or I may terminate the telemedicine visit at any time. I understand that I have the right to inspect all information obtained and/or recorded in the course of the telemedicine visit and may receive copies of available information for a reasonable fee.  I understand that some of the potential risks of receiving the Services via telemedicine include:  Delay or interruption in medical evaluation due to technological equipment failure or disruption; Information transmitted may not be sufficient (e.g. poor resolution of images) to allow for appropriate medical decision making by the  Practitioner; and/or  In rare instances, security protocols could fail, causing a breach of personal health information.  Furthermore, I acknowledge that it is my responsibility to provide information about my medical history, conditions and care that is complete and accurate to the best of my ability. I acknowledge that Practitioner's advice, recommendations, and/or decision may be based on factors not within their control, such as incomplete or inaccurate data provided by me or distortions of diagnostic images or specimens that may result from electronic transmissions. I understand that the practice of medicine is not an exact science and that Practitioner makes no warranties or guarantees regarding treatment outcomes. I acknowledge that a copy of this consent can be made available to me via my patient portal [facility] MyChart), or I can request a printed copy by calling the office of [facility].    I understand that my insurance will be billed for this visit.   I have read or had this consent read to me. I understand the contents of this consent, which adequately explains the benefits and risks of the Services being provided via telemedicine.  I have been provided ample opportunity to ask questions regarding this consent and the Services and have had my questions answered to my satisfaction. I give my informed consent for the services to be provided through the use of telemedicine in my medical care    "

## 2024-08-17 NOTE — Telephone Encounter (Signed)
" ° °  Name: Amanda Berry  DOB: Jan 26, 1934  MRN: [id number]  Primary Cardiologist: Redell Shallow, MD   Preoperative team, please contact this patient and set up a phone call appointment for further preoperative risk assessment. Please obtain consent and complete medication review. Thank you for your help.  I confirm that guidance regarding antiplatelet and oral anticoagulation therapy has been completed and, if necessary, noted below.  I also confirmed the patient resides in the state of [place] . As per [facility] Medical Board telemedicine laws, the patient must reside in the state in which the provider is licensed.   [name] KATHEE Pizza, FNP 08/17/2024, 10:01 AM [facility]    "

## 2024-08-20 ENCOUNTER — Ambulatory Visit (INDEPENDENT_AMBULATORY_CARE_PROVIDER_SITE_OTHER): Admitting: Audiology

## 2024-08-21 ENCOUNTER — Ambulatory Visit (INDEPENDENT_AMBULATORY_CARE_PROVIDER_SITE_OTHER)

## 2024-08-21 VITALS — BP 135/73 | HR 69 | Temp 97.6°F | Resp 20 | Ht 59.0 in | Wt 138.8 lb

## 2024-08-21 DIAGNOSIS — K219 Gastro-esophageal reflux disease without esophagitis: Secondary | ICD-10-CM | POA: Insufficient documentation

## 2024-08-21 DIAGNOSIS — D649 Anemia, unspecified: Secondary | ICD-10-CM | POA: Diagnosis not present

## 2024-08-21 DIAGNOSIS — E7800 Pure hypercholesterolemia, unspecified: Secondary | ICD-10-CM | POA: Insufficient documentation

## 2024-08-21 DIAGNOSIS — I4891 Unspecified atrial fibrillation: Secondary | ICD-10-CM | POA: Insufficient documentation

## 2024-08-21 DIAGNOSIS — D6859 Other primary thrombophilia: Secondary | ICD-10-CM | POA: Insufficient documentation

## 2024-08-21 DIAGNOSIS — M810 Age-related osteoporosis without current pathological fracture: Secondary | ICD-10-CM | POA: Insufficient documentation

## 2024-08-21 DIAGNOSIS — H8110 Benign paroxysmal vertigo, unspecified ear: Secondary | ICD-10-CM | POA: Insufficient documentation

## 2024-08-21 DIAGNOSIS — F5101 Primary insomnia: Secondary | ICD-10-CM | POA: Insufficient documentation

## 2024-08-21 DIAGNOSIS — D500 Iron deficiency anemia secondary to blood loss (chronic): Secondary | ICD-10-CM

## 2024-08-21 MED ORDER — DIPHENHYDRAMINE HCL 25 MG PO CAPS
25.0000 mg | ORAL_CAPSULE | Freq: Once | ORAL | Status: AC
  Administered 2024-08-21: 25 mg via ORAL
  Filled 2024-08-21: qty 1

## 2024-08-21 MED ORDER — IRON SUCROSE 200 MG IVPB - SIMPLE MED
200.0000 mg | Freq: Once | Status: AC
  Administered 2024-08-21: 200 mg via INTRAVENOUS
  Filled 2024-08-21: qty 110

## 2024-08-21 MED ORDER — ACETAMINOPHEN 325 MG PO TABS
650.0000 mg | ORAL_TABLET | Freq: Once | ORAL | Status: AC
  Administered 2024-08-21: 650 mg via ORAL
  Filled 2024-08-21: qty 2

## 2024-08-21 NOTE — Progress Notes (Signed)
 Diagnosis: Iron  Deficiency Anemia  Provider:  Mannam, Praveen MD  Procedure: IV Infusion  IV Type: Peripheral, IV Location: R Forearm  Venofer  (Iron  Sucrose), Dose: 200 mg  Infusion Start Time: 1217  Infusion Stop Time: 1234  Post Infusion IV Care: Observation period completed and Peripheral IV Discontinued  Discharge: Condition: Stable, Destination: Home . AVS Provided  Performed by:  Rocky FORBES Sar, RN

## 2024-08-21 NOTE — Telephone Encounter (Signed)
 Patient with diagnosis of A Fib on Eliquis  for anticoagulation.    Procedure: endoscopy Date of procedure: 09/19/24   CHA2DS2-VASc Score = 5  This indicates a 7.2% annual risk of stroke. The patient's score is based upon: CHF History: 0 HTN History: 1 Diabetes History: 0 Stroke History: 0 Vascular Disease History: 1 Age Score: 2 Gender Score: 1     CrCl 40 ml/min Platelet count 267k  Patient has not had an Afib/aflutter ablation in the last 3 months, DCCV within the last 4 weeks or a watchman implanted in the last 45 days    Per office protocol, patient can hold Eliquis  for 2 days prior to procedure.    **This guidance is not considered finalized until pre-operative APP has relayed final recommendations.**

## 2024-08-28 ENCOUNTER — Ambulatory Visit

## 2024-08-28 VITALS — BP 155/73 | HR 61 | Temp 98.5°F | Resp 14 | Ht 59.0 in | Wt 138.6 lb

## 2024-08-28 DIAGNOSIS — D500 Iron deficiency anemia secondary to blood loss (chronic): Secondary | ICD-10-CM

## 2024-08-28 DIAGNOSIS — D649 Anemia, unspecified: Secondary | ICD-10-CM | POA: Diagnosis not present

## 2024-08-28 MED ORDER — IRON SUCROSE 200 MG IVPB - SIMPLE MED
200.0000 mg | Freq: Once | Status: AC
  Administered 2024-08-28: 200 mg via INTRAVENOUS

## 2024-08-28 MED ORDER — ACETAMINOPHEN 325 MG PO TABS: 650.0000 mg | ORAL_TABLET | Freq: Once | ORAL | Status: DC

## 2024-08-28 MED ORDER — DIPHENHYDRAMINE HCL 25 MG PO CAPS: 25.0000 mg | ORAL_CAPSULE | Freq: Once | ORAL | Status: DC

## 2024-08-28 NOTE — Progress Notes (Signed)
 Diagnosis: Iron  Deficiency Anemia  Provider:  Mannam, Praveen MD  Procedure: IV Infusion  IV Type: Peripheral, IV Location: R Forearm  Venofer  (Iron  Sucrose), Dose: 200 mg  Infusion Start Time: 1154  Infusion Stop Time: 1210  Post Infusion IV Care: Patient declined observation and Peripheral IV Discontinued  Discharge: Condition: Good, Destination: Home . AVS Declined  Performed by:  Rocky FORBES Sar, RN

## 2024-09-04 ENCOUNTER — Ambulatory Visit

## 2024-09-04 VITALS — BP 125/68 | HR 68 | Temp 98.0°F | Resp 18 | Ht 59.0 in | Wt 137.2 lb

## 2024-09-04 DIAGNOSIS — D500 Iron deficiency anemia secondary to blood loss (chronic): Secondary | ICD-10-CM

## 2024-09-04 DIAGNOSIS — D649 Anemia, unspecified: Secondary | ICD-10-CM

## 2024-09-04 MED ORDER — SODIUM CHLORIDE 0.9 % IV SOLN
200.0000 mg | Freq: Once | INTRAVENOUS | Status: AC
  Administered 2024-09-04: 200 mg via INTRAVENOUS
  Filled 2024-09-04: qty 10

## 2024-09-04 MED ORDER — ACETAMINOPHEN 325 MG PO TABS: 650.0000 mg | ORAL_TABLET | Freq: Once | ORAL | Status: DC

## 2024-09-04 MED ORDER — DIPHENHYDRAMINE HCL 25 MG PO CAPS: 25.0000 mg | ORAL_CAPSULE | Freq: Once | ORAL | Status: DC

## 2024-09-04 NOTE — Progress Notes (Signed)
 Diagnosis: Acute Anemia  Provider:  Mannam, Praveen MD  Procedure: IV Infusion  IV Type: Peripheral, IV Location: R Forearm  Venofer  (Iron  Sucrose), Dose: 200 mg  Infusion Start Time: 1137  Infusion Stop Time: 1153  Post Infusion IV Care: Patient declined observation and Peripheral IV Discontinued  Discharge: Condition: Stable, Destination: Home . AVS Declined  Performed by:  Rocky FORBES Search, RN

## 2024-09-06 ENCOUNTER — Ambulatory Visit: Attending: Student in an Organized Health Care Education/Training Program | Admitting: Nurse Practitioner

## 2024-09-06 DIAGNOSIS — Z01810 Encounter for preprocedural cardiovascular examination: Secondary | ICD-10-CM

## 2024-09-06 NOTE — Progress Notes (Signed)
 "   Virtual Visit via Telephone Note   Because of Amanda Berry co-morbid illnesses, she is at least at moderate risk for complications without adequate follow up.  This format is felt to be most appropriate for this patient at this time.  Due to technical limitations with video connection (technology), today's appointment will be conducted as an audio only telehealth visit, and Amanda Berry verbally agreed to proceed in this manner.   All issues noted in this document were discussed and addressed.  No physical exam could be performed with this format.  Evaluation Performed:  Preoperative cardiovascular risk assessment _____________   Date:  09/06/2024   Patient ID:  Amanda Berry, DOB 1933-10-16, MRN [id number] Patient Location:  Home Provider location:   Office  Primary Care Provider:  Arloa Elsie SAUNDERS, MD Primary Cardiologist:  Redell Shallow, MD  Chief Complaint / Patient Profile   89 y.o. y/o female with a h/o paroxysmal atrial fibrillation on Eliquis , mild aortic valve stenosis, hypertension, and symptomatic anemia who is pending endoscopy on 09/19/2024 with Dr. Layla Lah of [facility] and presents today for telephonic preoperative cardiovascular risk assessment.  History of Present Illness    Amanda Berry is a 89 y.o. female who presents via audio/video conferencing for a telehealth visit today.  Pt was last seen in cardiology clinic on 10/13/2023 by Damien Braver, NP.  At that time Amanda Berry was doing well. The patient is now pending procedure as outlined above. Since her last visit, she has done well from a cardiac standpoint.   She denies chest pain, palpitations, dyspnea, pnd, orthopnea, n, v, dizziness, syncope, edema, weight gain, or early satiety. All other systems reviewed and are otherwise negative except as noted above.   Past Medical History    Past Medical History:  Diagnosis Date   Constipation    Fracture of femur, right, closed (HCC) 2017    Hypertension    Overactive bladder    Symptomatic anemia 03/2016   Past Surgical History:  Procedure Laterality Date   ABDOMINAL HYSTERECTOMY     COLONOSCOPY N/A 10/16/2021   Procedure: COLONOSCOPY;  Surgeon: Lah Layla, MD;  Location: [facility];  Service: Gastroenterology;  Laterality: N/A;   ESOPHAGOGASTRODUODENOSCOPY N/A 10/16/2021   Procedure: ESOPHAGOGASTRODUODENOSCOPY (EGD);  Surgeon: Lah Layla, MD;  Location: [facility] ENDOSCOPY;  Service: Gastroenterology;  Laterality: N/A;   HEMOSTASIS CLIP PLACEMENT  10/16/2021   Procedure: HEMOSTASIS CLIP PLACEMENT;  Surgeon: Lah Layla, MD;  Location: [facility] ENDOSCOPY;  Service: Gastroenterology;;   KNEE ARTHROPLASTY Right    approx 10 years ago   ORIF PERIPROSTHETIC FRACTURE Right 04/12/2016   Procedure: OPEN REDUCTION INTERNAL FIXATION (ORIF) RIGHT DISTAL FEMUR  PERIPROSTHETIC FRACTURE;  Surgeon: Donnice Car, MD;  Location: [facility];  Service: Orthopedics;  Laterality: Right;   POLYPECTOMY  10/16/2021   Procedure: POLYPECTOMY;  Surgeon: Lah Layla, MD;  Location: [facility] ENDOSCOPY;  Service: Gastroenterology;;   ROTATOR CUFF REPAIR Right 1990's    Allergies  Allergies[1]  Home Medications    Prior to Admission medications  Medication Sig Start Date End Date Taking? Authorizing Provider  acetaminophen  (TYLENOL ) 500 MG tablet Take 1,000 mg by mouth every 6 (six) hours as needed for mild pain.    [provider]  apixaban  (ELIQUIS ) 5 MG TABS tablet Take 1 tablet (5 mg total) by mouth 2 (two) times daily. 01/31/19   Will Almarie MATSU, MD  benzonatate (TESSALON) 100 MG capsule Take 100 mg by mouth 3 (three) times  daily as needed. 09/17/21   [provider]  calcium carbonate (OS-CAL) 600 MG TABS tablet Take 600 mg by mouth daily with breakfast.    [provider]  cephALEXin (KEFLEX) 500 MG capsule Take 500 mg by mouth 3 (three) times daily. 05/23/24   [provider]  chlorthalidone   (HYGROTON ) 25 MG tablet Take 1 tablet by mouth once daily 11/15/23   Pietro Redell RAMAN, MD  Cholecalciferol (VITAMIN D3 PO) Take by mouth daily.    [provider]  diltiazem  (CARDIZEM  CD) 240 MG 24 hr capsule Take 1 capsule by mouth once daily 04/25/24   Pietro Redell RAMAN, MD  gabapentin (NEURONTIN) 100 MG capsule Take 100 mg by mouth daily. 12/06/23   [provider]  Menthol , Topical Analgesic, (BIOFREEZE EX) Apply 1 application topically as needed (Arthritis pain).    [provider]  pantoprazole  (PROTONIX ) 40 MG tablet Take 40 mg by mouth daily. 06/27/24   [provider]  traZODone (DESYREL) 50 MG tablet Take 50 mg by mouth at bedtime as needed.    [provider]  valsartan  (DIOVAN ) 80 MG tablet Take 1 tablet by mouth once daily 11/10/23   Daneen Damien BROCKS, NP    Physical Exam    Vital Signs:  Amanda Berry does not have vital signs available for review today.  Given telephonic nature of communication, physical exam is limited. AAOx3. NAD. Normal affect.  Speech and respirations are unlabored.  Accessory Clinical Findings    None  Assessment & Plan    1.  Preoperative Cardiovascular Risk Assessment:  According to the Revised Cardiac Risk Index (RCRI), her Perioperative Risk of Major Cardiac Event is (%): 0.4. Her Functional Capacity in METs is: 5.62 according to the Duke Activity Status Index (DASI). Therefore, based on ACC/AHA guidelines, patient would be at acceptable risk for the planned procedure without further cardiovascular testing.   The patient was advised that if she develops new symptoms prior to surgery to contact our office to arrange for a follow-up visit, and she verbalized understanding.  Per office protocol, patient can hold Eliquis  for 2 days prior to procedure.  Please resume Eliquis  as soon as possible postprocedure, at the discretion of the surgeon.    A copy of this note will be routed to requesting surgeon.  Time:    Today, I have spent 6 minutes with the patient with telehealth technology discussing medical history, symptoms, and management plan.     Damien BROCKS Daneen, NP  09/06/2024, 10:38 AM     [1] No Known Allergies  "

## 2024-09-06 NOTE — Telephone Encounter (Signed)
 CMA Lonell, put [facility] fax# and not the fax# for Dr. Elicia.   I will fax notes to Dr. Mitchell office for preop APP Damien Braver, NP.

## 2024-09-07 ENCOUNTER — Encounter (INDEPENDENT_AMBULATORY_CARE_PROVIDER_SITE_OTHER): Payer: Self-pay

## 2024-09-11 ENCOUNTER — Ambulatory Visit

## 2024-09-11 VITALS — BP 145/73 | HR 55 | Temp 97.3°F | Resp 12 | Ht 59.0 in | Wt 136.8 lb

## 2024-09-11 DIAGNOSIS — D649 Anemia, unspecified: Secondary | ICD-10-CM

## 2024-09-11 DIAGNOSIS — D500 Iron deficiency anemia secondary to blood loss (chronic): Secondary | ICD-10-CM

## 2024-09-11 MED ORDER — IRON SUCROSE 200 MG IVPB - SIMPLE MED
200.0000 mg | Freq: Once | Status: AC
  Administered 2024-09-11: 200 mg via INTRAVENOUS

## 2024-09-11 MED ORDER — DIPHENHYDRAMINE HCL 25 MG PO CAPS: 25.0000 mg | ORAL_CAPSULE | Freq: Once | ORAL | Status: DC

## 2024-09-11 MED ORDER — ACETAMINOPHEN 325 MG PO TABS: 650.0000 mg | ORAL_TABLET | Freq: Once | ORAL | Status: DC

## 2024-09-11 NOTE — Progress Notes (Signed)
 Diagnosis: Iron  Deficiency Anemia  Provider:  Mannam, Praveen MD  Procedure: IV Infusion  IV Type: Peripheral, IV Location: R Forearm  Venofer  (Iron  Sucrose), Dose: 200 mg  Infusion Start Time: 1131  Infusion Stop Time: 1147  Post Infusion IV Care: Patient declined observation and Peripheral IV Discontinued  Discharge: Condition: Good, Destination: Home . AVS Declined  Performed by:  Rocky FORBES Search, RN

## 2024-09-12 NOTE — Addendum Note (Signed)
 Addended by: DAYNE SHERRY RAMAN on: 09/12/2024 04:04 PM   Modules accepted: Orders

## 2024-10-25 ENCOUNTER — Inpatient Hospital Stay

## 2025-01-22 ENCOUNTER — Inpatient Hospital Stay: Admitting: Hematology

## 2025-01-22 ENCOUNTER — Inpatient Hospital Stay
